# Patient Record
Sex: Female | Born: 1980 | Race: Black or African American | Hispanic: No | Marital: Single | Smoking: Never smoker
Health system: Southern US, Community
[De-identification: ages and names within clinical notes are randomized; demographics above are authoritative.]

## PROBLEM LIST (undated history)

## (undated) DIAGNOSIS — E669 Obesity, unspecified: Secondary | ICD-10-CM

## (undated) DIAGNOSIS — I639 Cerebral infarction, unspecified: Secondary | ICD-10-CM

## (undated) DIAGNOSIS — J302 Other seasonal allergic rhinitis: Secondary | ICD-10-CM

## (undated) HISTORY — PX: ABDOMINAL SURGERY: SHX537

---

## 2017-04-06 HISTORY — PX: ABDOMINAL SURGERY: SHX537

## 2021-06-24 ENCOUNTER — Encounter (HOSPITAL_COMMUNITY): Payer: Self-pay | Admitting: Emergency Medicine

## 2021-06-24 ENCOUNTER — Emergency Department (HOSPITAL_COMMUNITY): Payer: BC Managed Care – PPO

## 2021-06-24 ENCOUNTER — Other Ambulatory Visit: Payer: Self-pay

## 2021-06-24 ENCOUNTER — Observation Stay (HOSPITAL_COMMUNITY): Payer: BC Managed Care – PPO

## 2021-06-24 ENCOUNTER — Inpatient Hospital Stay (HOSPITAL_COMMUNITY)
Admission: EM | Admit: 2021-06-24 | Discharge: 2021-06-28 | DRG: 065 | Disposition: A | Discharge status: Rehabilitation Facility | Payer: BC Managed Care – PPO | Attending: Family Medicine | Admitting: Family Medicine

## 2021-06-24 DIAGNOSIS — R262 Difficulty in walking, not elsewhere classified: Secondary | ICD-10-CM | POA: Diagnosis present

## 2021-06-24 DIAGNOSIS — I69354 Hemiplegia and hemiparesis following cerebral infarction affecting left non-dominant side: Secondary | ICD-10-CM

## 2021-06-24 DIAGNOSIS — Z20822 Contact with and (suspected) exposure to covid-19: Secondary | ICD-10-CM | POA: Diagnosis present

## 2021-06-24 DIAGNOSIS — R29705 NIHSS score 5: Secondary | ICD-10-CM | POA: Diagnosis present

## 2021-06-24 DIAGNOSIS — I6389 Other cerebral infarction: Secondary | ICD-10-CM

## 2021-06-24 DIAGNOSIS — J302 Other seasonal allergic rhinitis: Secondary | ICD-10-CM

## 2021-06-24 DIAGNOSIS — D509 Iron deficiency anemia, unspecified: Secondary | ICD-10-CM

## 2021-06-24 DIAGNOSIS — E538 Deficiency of other specified B group vitamins: Secondary | ICD-10-CM | POA: Diagnosis present

## 2021-06-24 DIAGNOSIS — I639 Cerebral infarction, unspecified: Secondary | ICD-10-CM | POA: Diagnosis not present

## 2021-06-24 DIAGNOSIS — Z9884 Bariatric surgery status: Secondary | ICD-10-CM

## 2021-06-24 DIAGNOSIS — Z8249 Family history of ischemic heart disease and other diseases of the circulatory system: Secondary | ICD-10-CM

## 2021-06-24 DIAGNOSIS — R7303 Prediabetes: Secondary | ICD-10-CM | POA: Diagnosis present

## 2021-06-24 DIAGNOSIS — Z823 Family history of stroke: Secondary | ICD-10-CM

## 2021-06-24 DIAGNOSIS — K5901 Slow transit constipation: Secondary | ICD-10-CM | POA: Diagnosis present

## 2021-06-24 DIAGNOSIS — I6381 Other cerebral infarction due to occlusion or stenosis of small artery: Secondary | ICD-10-CM | POA: Diagnosis not present

## 2021-06-24 DIAGNOSIS — E785 Hyperlipidemia, unspecified: Secondary | ICD-10-CM | POA: Diagnosis present

## 2021-06-24 DIAGNOSIS — Z6841 Body Mass Index (BMI) 40.0 and over, adult: Secondary | ICD-10-CM

## 2021-06-24 DIAGNOSIS — I10 Essential (primary) hypertension: Secondary | ICD-10-CM | POA: Diagnosis present

## 2021-06-24 HISTORY — DX: Other seasonal allergic rhinitis: J30.2

## 2021-06-24 HISTORY — DX: Obesity, unspecified: E66.9

## 2021-06-24 LAB — CBC
HCT: 37.5 % (ref 36.0–46.0)
HCT: 37.9 % (ref 36.0–46.0)
Hemoglobin: 12 g/dL (ref 12.0–15.0)
Hemoglobin: 12.2 g/dL (ref 12.0–15.0)
MCH: 26.1 pg (ref 26.0–34.0)
MCH: 26.3 pg (ref 26.0–34.0)
MCHC: 32 g/dL (ref 30.0–36.0)
MCHC: 32.2 g/dL (ref 30.0–36.0)
MCV: 81.5 fL (ref 80.0–100.0)
MCV: 81.9 fL (ref 80.0–100.0)
Platelets: 456 10*3/uL — ABNORMAL HIGH (ref 150–400)
Platelets: 454 10*3/uL — ABNORMAL HIGH (ref 150–400)
RBC: 4.6 MIL/uL (ref 3.87–5.11)
RBC: 4.63 MIL/uL (ref 3.87–5.11)
RDW: 13.7 % (ref 11.5–15.5)
RDW: 13.7 % (ref 11.5–15.5)
WBC: 5.8 10*3/uL (ref 4.0–10.5)
WBC: 7.2 10*3/uL (ref 4.0–10.5)
nRBC: 0 % (ref 0.0–0.2)
nRBC: 0 % (ref 0.0–0.2)

## 2021-06-24 LAB — COMPREHENSIVE METABOLIC PANEL
ALT: 21 U/L (ref 0–44)
AST: 19 U/L (ref 15–41)
Albumin: 3.5 g/dL (ref 3.5–5.0)
Alkaline Phosphatase: 61 U/L (ref 38–126)
Anion gap: 7 (ref 5–15)
BUN: 6 mg/dL (ref 6–20)
CO2: 24 mmol/L (ref 22–32)
Calcium: 8.9 mg/dL (ref 8.9–10.3)
Chloride: 106 mmol/L (ref 98–111)
Creatinine, Ser: 0.76 mg/dL (ref 0.44–1.00)
GFR, Estimated: >60 mL/min (ref >60)
Glucose, Bld: 95 mg/dL (ref 70–99)
Potassium: 3.9 mmol/L (ref 3.5–5.1)
Sodium: 137 mmol/L (ref 135–145)
Total Bilirubin: 0.4 mg/dL (ref 0.3–1.2)
Total Protein: 7.3 g/dL (ref 6.5–8.1)

## 2021-06-24 LAB — ECHOCARDIOGRAM COMPLETE BUBBLE STUDY
AR max vel: 2.96 cm2
AV Area VTI: 3.12 cm2
AV Area mean vel: 2.73 cm2
AV Mean grad: 4 mmHg
AV Peak grad: 7.3 mmHg
Ao pk vel: 1.35 m/s
Area-P 1/2: 2.99 cm2
S' Lateral: 3.1 cm

## 2021-06-24 LAB — LIPID PANEL
Cholesterol: 196 mg/dL (ref 0–200)
HDL: 46 mg/dL (ref >40)
LDL Cholesterol: 120 mg/dL — ABNORMAL HIGH (ref 0–99)
Total CHOL/HDL Ratio: 4.3 RATIO
Triglycerides: 148 mg/dL (ref <150)
VLDL: 30 mg/dL (ref 0–40)

## 2021-06-24 LAB — HEMOGLOBIN A1C
Hgb A1c MFr Bld: 5.9 % — ABNORMAL HIGH (ref 4.8–5.6)
Mean Plasma Glucose: 122.63 mg/dL

## 2021-06-24 LAB — I-STAT BETA HCG BLOOD, ED (MC, WL, AP ONLY): I-stat hCG, quantitative: <5 m[IU]/mL (ref <5)

## 2021-06-24 LAB — DIFFERENTIAL
Abs Immature Granulocytes: 0.02 10*3/uL (ref 0.00–0.07)
Basophils Absolute: 0 10*3/uL (ref 0.0–0.1)
Basophils Relative: 0 %
Eosinophils Absolute: 0.1 10*3/uL (ref 0.0–0.5)
Eosinophils Relative: 1 %
Immature Granulocytes: 0 %
Lymphocytes Relative: 33 %
Lymphs Abs: 1.9 10*3/uL (ref 0.7–4.0)
Monocytes Absolute: 0.3 10*3/uL (ref 0.1–1.0)
Monocytes Relative: 6 %
Neutro Abs: 3.4 10*3/uL (ref 1.7–7.7)
Neutrophils Relative %: 60 %

## 2021-06-24 LAB — TSH: TSH: 1.656 u[IU]/mL (ref 0.350–4.500)

## 2021-06-24 LAB — PROTIME-INR
INR: 0.9 (ref 0.8–1.2)
Prothrombin Time: 12.6 seconds (ref 11.4–15.2)

## 2021-06-24 LAB — SARS CORONAVIRUS 2 (TAT 6-24 HRS): SARS Coronavirus 2: NEGATIVE

## 2021-06-24 LAB — APTT: aPTT: 26 seconds (ref 24–36)

## 2021-06-24 MED ORDER — LABETALOL HCL 5 MG/ML IV SOLN: 5.0000 mg | INTRAVENOUS | Status: DC | PRN

## 2021-06-24 MED ORDER — ATORVASTATIN CALCIUM 40 MG PO TABS
40.0000 mg | ORAL_TABLET | Freq: Every day | ORAL | Status: DC
  Administered 2021-06-24 – 2021-06-28 (×5): 40 mg via ORAL
  Filled 2021-06-24 (×5): qty 1

## 2021-06-24 MED ORDER — SENNOSIDES-DOCUSATE SODIUM 8.6-50 MG PO TABS
1.0000 | ORAL_TABLET | Freq: Every evening | ORAL | Status: DC | PRN
Start: 2021-06-24 — End: 2021-06-27

## 2021-06-24 MED ORDER — ACETAMINOPHEN 160 MG/5ML PO SOLN: 650.0000 mg | ORAL | Status: DC | PRN

## 2021-06-24 MED ORDER — ACETAMINOPHEN 650 MG RE SUPP
650.0000 mg | RECTAL | Status: DC | PRN
Start: 2021-06-24 — End: 2021-06-27

## 2021-06-24 MED ORDER — ASPIRIN 81 MG PO CHEW
81.0000 mg | CHEWABLE_TABLET | Freq: Every day | ORAL | Status: DC
  Administered 2021-06-24 – 2021-06-28 (×5): 81 mg via ORAL
  Filled 2021-06-24 (×5): qty 1

## 2021-06-24 MED ORDER — ENOXAPARIN SODIUM 80 MG/0.8ML IJ SOSY
70.0000 mg | PREFILLED_SYRINGE | INTRAMUSCULAR | Status: DC
  Administered 2021-06-24 – 2021-06-27 (×4): 70 mg via SUBCUTANEOUS
  Filled 2021-06-24 (×3): qty 0.8
  Filled 2021-06-24: qty 0.7
  Filled 2021-06-24: qty 0.8

## 2021-06-24 MED ORDER — IOHEXOL 350 MG/ML SOLN
75.0000 mL | Freq: Once | INTRAVENOUS | Status: AC | PRN
  Administered 2021-06-24: 75 mL via INTRAVENOUS

## 2021-06-24 MED ORDER — STROKE: EARLY STAGES OF RECOVERY BOOK
Freq: Once | Status: AC
  Administered 2021-06-24: 1
  Filled 2021-06-24: qty 1

## 2021-06-24 MED ORDER — ACETAMINOPHEN 325 MG PO TABS
650.0000 mg | ORAL_TABLET | ORAL | Status: DC | PRN
  Administered 2021-06-25 – 2021-06-27 (×3): 650 mg via ORAL
  Filled 2021-06-24 (×3): qty 2

## 2021-06-24 MED ORDER — LORATADINE 10 MG PO TABS
10.0000 mg | ORAL_TABLET | Freq: Every day | ORAL | Status: DC
  Administered 2021-06-25 – 2021-06-28 (×4): 10 mg via ORAL
  Filled 2021-06-24 (×5): qty 1

## 2021-06-24 NOTE — H&P (Addendum)
Family Medicine Teaching [facility] Admission History and Physical Service Pager: [phone]  Patient name: Danielle Kelley Medical record number: [id number] Date of birth: [birth year] Age: 40 y.o. Gender: female  Primary Care Provider: Chyrel Masson Consultants: Neurology  Code Status: Full code  Preferred Emergency Contact: Franne Forts, Clearence Cheek, [phone]  Chief Complaint: left sided weakness and paraesthesias   Assessment and Plan: Danielle Kelley is a 40 y.o. female presenting with left sided weakness and paraesthesias. PMH is significant for obesity s/p gastric bypass.   Acute CVA  Patient presented to ED with left upper and lower extremity weakness.  Patient noticed weakness on the left side yesterday afternoon which progressed to having difficulty walking and heaviness on the left extremities.  She denies any other symptomatology.  Patient received aspirin 81 on presentation to the ED. CBC and CMP unremarkable.  Patient states she does not have any medical problems and that at her last 2 doctors appointments there were no concerns mentioned for her blood work.  Differential limited as stroke seen on imaging.  tPA was not administered as patient was outside therapeutic window.  Unsure at this time if patient meets stroke risk factors with hyperlipidemia and elevated A1c although patient does struggle with obesity. - admit to FPTS, med-tele, attending Dr. Deirdre Priest  - vital signs q2h x 12 hours, then q4h - fall precautions - Pulse ox check q2h x 12 hrs, then q4h - PT/OT/SLP eval and treat - Neurology following, appreciate recommendations -mNIHSS q2h x 12 hours then q4h - Cardiac monitoring continuous - f/u echocardiogram - f/u lipid panel - ASA 81mg  daily - start atorvastatin 40mg  daily - CMP, CBC daily  Allergies Patient reports that she is at home well controlled with generic brand Claritin. - Claritin 10 mg daily  FEN/GI: N.p.o. until able to pass swallow  study  Prophylaxis: Lovenox  Disposition: med-tele  History of Present Illness:  Danielle Kelley is a 40 y.o. female presenting with left upper and LE weakness   She reports that she presented to the ED after noticing weakness on her left side in her arm, leg, hand and foot. She reports that yesterday, she spent the day cleaning her home from 1030 until 1300. Her arm and leg on the left felt heavy and so she thought that she had applied to much pressure to that side of her body while cleaning 8/21 at 1330. This heaviness continued throughout the day. She denies any pain or headache yesterday. She reports that she was still able to cook and clean despite this heaviness that worsened today.She did not notice any asymmetry in her face. Patient reports doing a "smile test" and did not see any abnormalities. She then had trouble walking when she tried to get up and go to the restroom.  She reports normal grip ability on the left but states that her reaction time is slower than normal. She states that when she tries to ambulate, she falls toward the left. She denies any prior issues in the past.   ED course: MRI brain showed acute CVA. Neurology evaluated the patient and recommended ASA 81 and echo as well as lipid panel and hgb A1c.    Review Of Systems: Per HPI with the following additions:   Review of Systems  Constitutional:  Negative for chills, fatigue and fever.  HENT:  Negative for congestion, hearing loss and trouble swallowing.   Eyes:  Negative for visual disturbance.       Sees floaters (06/05/21)  Respiratory:  Negative for cough and shortness of breath.   Cardiovascular:  Negative for chest pain and palpitations.  Gastrointestinal:  Negative for abdominal pain, blood in stool, constipation, diarrhea, nausea and vomiting.  Genitourinary:  Negative for dysuria and hematuria.  Musculoskeletal:  Positive for gait problem.  Skin:  Negative for rash.  Neurological:  Positive for weakness.  Negative for tremors, seizures, syncope, facial asymmetry, speech difficulty and headaches.       Changes with word finding   Psychiatric/Behavioral:  Negative for hallucinations.     Patient Active Problem List   Diagnosis Date Noted   Acute CVA (cerebrovascular accident) (HCC) 06/24/2021     Past Medical History: -obesity  -allergy pill from [employer] (allergic rhinitis/conjunctivitis)   Past Surgical History: -2018 June 4, had gastric sleeve, previously weighed 355lbs  -C-sectionx3, Nov 07 2002, Nov 22 2004, February 16 2006  Social History: Additional social history: denies recreational drugs, never smoked, drinks wine socially/rare Moved to [place] in June of 2021 for work   Family History: Mother CVA 2018 (mother lives with the patient)  HTN- mother, maternal gma, maternal gf  Allergies and Medications: No Known Allergies No current facility-administered medications on file prior to encounter.   Current Outpatient Medications on File Prior to Encounter  Medication Sig Dispense Refill   cetirizine (ZYRTEC) 10 MG tablet Take 10 mg by mouth daily as needed for allergies.      Objective: BP (!) 138/103   Pulse 82   Temp 98.2 F (36.8 C) (Oral)   Resp (!) 23   Ht 5\' 10"  (1.778 m)   Wt (!) 142.9 kg   LMP 06/11/2021 (Exact Date)   SpO2 99%   BMI 45.20 kg/m   Exam: General: Awake, alert, comfortable appearing, no acute distress Eyes: EOMI, PERRLA ENTM: Moist mucous membranes Neck: Normal range of motion Cardiovascular: RRR, no murmurs auscultated Respiratory: CTA B, no increased work of breathing Gastrointestinal: Soft, obese abdomen, normoactive bowel sounds MSK: Normal tone Derm: No rashes or lesions noted Neuro: Cranial nerves grossly intact with the exception of cranial nerve XI presenting as weakened left shoulder shrug. 3/5 strength left upper and lower extremity, 5/5 strength right upper and lower extremity, decreased sensation to touch in left lower extremity  in the lateral portion.  No evidence of aphasia Psych: Normal mood and affect  Labs and Imaging: CBC BMET  Recent Labs  Lab 06/24/21 1555  WBC 7.2  HGB 12.2  HCT 37.9  PLT 454*   Recent Labs  Lab 06/24/21 0942  NA 137  K 3.9  CL 106  CO2 24  BUN 6  CREATININE 0.76  GLUCOSE 95  CALCIUM 8.9     EKG: NSR, normal PR and QRS interval, normal T wave, no QT prolongation  Imaging Head CT: Normal  Brain MRA/MRI: Small acute infarct in the periventricular right posterior frontal corona radiata, faint edema, no mass-effect. No large vessel occlusion or proximal hemodynamiccaly significant stenosis  06/26/21 DO PGY-1,  Family Medicine FPTS Intern pager: [phone], text pages welcome

## 2021-06-24 NOTE — Progress Notes (Addendum)
[name]  S Saw patient at bedside this evening. Patient was sitting up in bed comfortably. She reports she is able to move her left LUE and LLE a little bit. She is disappointed that she will not be able to fly to [place] to help her daughter move into university. She is hungry and would like a fork to eat her dinner. She also asked when she would be discharged and whether she would be able to fly anytime soon. I explained she would need to be in hospital for further monitoring post stroke and PT/OT. It would be unsafe for her to travel by flight this week. She understood and was grateful for the care she has received at [facility].   O: BP (!) 159/103   Pulse 71   Temp 98.2 F (36.8 C) (Oral)   Resp 20   Ht 5\' 10"  (1.778 m)   Wt (!) 142.9 kg   LMP 06/11/2021 (Exact Date)   SpO2 99%   BMI 45.20 kg/m    General: Alert, no acute distress, pleasant  Cardio: well perfused  Pulm: normal work of breathing Neuro: Cranial nerves grossly intact, 3/5 strength UE and LE, normal sensation throughout  A/P: Plan H&P -Neuro following -Continue stroke management  -Orders reviewed. Labs for AM ordered, which was adjusted as needed.   08/11/2021, MD 06/24/2021, 7:51 PM PGY-3, [place] [facility] Night Resident  Please page [phone] with questions.

## 2021-06-24 NOTE — ED Notes (Signed)
Patient transported to CT 

## 2021-06-24 NOTE — ED Notes (Signed)
In MRI

## 2021-06-24 NOTE — ED Notes (Signed)
IV team at bedside 

## 2021-06-24 NOTE — ED Notes (Signed)
Patient is getting echo

## 2021-06-24 NOTE — ED Triage Notes (Signed)
Pt reports LUE and LLE weakness onset yesterday. LSN 1030 yesterday morning. Did vigorous cleaning yesterday and yesterday evening noticed L arm and leg feeling a little off. This morning stumbled getting out of bed d/t weakness. Is able to stand with a good amount of assistance from staff, feels as if weakness is continuing to progress. Denies headache, vision changes, or dizziness.

## 2021-06-24 NOTE — Consult Note (Addendum)
Neurology Consultation Reason for Consult: Left-sided weakness Referring Physician: Dr Rolan Bucco  CC: Left-sided weakness  History is obtained from: Patient  HPI: Danielle Kelley is a 40 y.o. right-handed female with no significant past medical history who presented with left upper and lower extremity weakness and paresthesias.  Patient states she was cleaning her tub yesterday afternoon around 1:30 PM when she noticed that her left arm and leg felt "heavy".  She initially attributed this to putting pressure on her left side while cleaning.  Throughout the rest of the day she continued to notice that her left arm and leg were "heavy".  This morning when she woke up and tried to get out of bed she "stumbled" and then had trouble climbing up the stairs.  Therefore, she eventually decided to come to the emergency room for further evaluation. Denies any paresthesias or weakness of face, facial droop, trouble speaking, vision changes, bowel or bladder incontinence. Denies similar episodes in the past.  Denies any recent medications, travel, fever, headache.  Last known normal 06/23/2021 around 1330 NIH stroke scale 5 ( 2 for LUE, 2 for LLE, 2 for sensory)  ROS: All other systems reviewed and negative except as noted in the HPI.    Past medical history, past surgical history: Denies any significant past medical or surgical history  Social history: Denies history of nicotine use, alcohol use.  Works in [employer] at [employer].  Family history: Patient's mother had stroke.  Patient maternal aunt has multiple sclerosis.   Exam: Current vital signs: BP 134/79   Pulse 65   Temp 98.2 F (36.8 C)   Resp 18   Ht 5\' 10"  (1.778 m)   Wt (!) 142.9 kg   LMP 06/11/2021 (Exact Date)   SpO2 100%   BMI 45.20 kg/m  Vital signs in last 24 hours: Temp:  [98.2 F (36.8 C)] 98.2 F (36.8 C) (08/22 0923) Pulse Rate:  [65-74] 65 (08/22 1145) Resp:  [14-21] 18 (08/22 1145) BP: (129-147)/(77-96) 134/79 (08/22  1145) SpO2:  [98 %-100 %] 100 % (08/22 1145) Weight:  [142.9 kg] 142.9 kg (08/22 0918)   Physical Exam  Constitutional: Appears well-developed and well-nourished.  Psych: Affect appropriate to situation Eyes: No scleral injection HENT: No OP obstrucion Head: Normocephalic.  Cardiovascular: Normal rate and regular rhythm.  Respiratory: Effort normal, non-labored breathing GI: Soft.  No distension. There is no tenderness.  Skin: Warm Neuro: AOx3, follows commands, no evidence of aphasia, cranial nerves II to XII grossly intact, 3/5 strength in left upper and left lower extremity, 5/5 in right upper and lower extremity, decreased sensation to touch in left upper and lower extremity, FTN intact bilaterally, 2+ reflexes in bilateral biceps, knee jerk.  Plantars mute bilaterally, no ankle clonus.  I have reviewed labs in epic and the results pertinent to this consultation are: CBC:  Recent Labs  Lab 06/24/21 0942  WBC 5.8  NEUTROABS 3.4  HGB 12.0  HCT 37.5  MCV 81.5  PLT 456*    Basic Metabolic Panel:  Lab Results  Component Value Date   NA 137 06/24/2021   K 3.9 06/24/2021   CO2 24 06/24/2021   GLUCOSE 95 06/24/2021   BUN 6 06/24/2021   CREATININE 0.76 06/24/2021   CALCIUM 8.9 06/24/2021   GFRNONAA >60 06/24/2021   Lipid Panel: No results found for: LDLCALC HgbA1c: No results found for: HGBA1C Urine Drug Screen: No results found for: LABOPIA, COCAINSCRNUR, LABBENZ, AMPHETMU, THCU, LABBARB  Alcohol Level No results found  for: [facility]   I have reviewed the images obtained:   CT head without contrast 06/24/2021: No acute abnormality.  ASSESSMENT/PLAN: 40 year old female with no prior medical history who presented with left upper and lower extremity weakness and numbness since yesterday afternoon.  Left hemiparesis -Differentials include acute stroke versus demyelinating disease  Recommendations: -We will obtain MRI brain with and without contrast, MRI angio head without  contrast to look for acute stroke -Will also obtain MRI C-spine and T-spine with and without contrast potential demyelinating etiology -We will reevaluate patient after MRI for further management  ADDENDUM  Acute ischemic stroke - tPA not administered as patient was outside window. No thrombectomy as patient outside window - MRI Brain showed acute stroke which explains patient's presentation so we will discontinue spine imaging -Recommend admission under medicine service. - Ordered TTE as part of stroke work-up - Will check LDL, A1c to assess for stroke risk factors - Start patient on aspirin 81 mg as well as atorvastatin for secondary stroke prevention - permissive HTN with prn labetelol for SBP>180 -Updated ED provider regarding plan -Discussed MRI findings and further work-up with patient in detail    I have spent a total of  125 minutes with the patient reviewing hospital notes,  test results, labs and examining the patient as well as establishing an assessment and plan that was discussed personally with the patient.  > 50% of time was spent in direct patient care.   Lindie Spruce Epilepsy [facility] neurohospitalist

## 2021-06-24 NOTE — ED Provider Notes (Signed)
[facility] EMERGENCY DEPARTMENT Provider Note   CSN: [id number] Arrival date & time: 06/24/21  1540     History Chief Complaint  Patient presents with   Weakness    Danielle Kelley is a 40 y.o. female with no reported medical history, reports taking daily allergy pill.  Presents to the emergency department with a chief plaint of left upper extremity and left lower extremity weakness.  Patient reports that symptoms began yesterday afternoon.  Patient reports that symptoms have been constant since then.  Patient ports extremities felt "heavy yesterday."  Patient states that after waking this morning she had difficulty ambulating and difficulty climbing stairs due to her weakness.  Patient reports decree sensation to left upper and lower extremity as well as tingling sensation to left toes.  Last seen normal was 1030 yesterday morning.  Patient denies any recent falls or traumatic injuries.  No recent illness.  Denies any previous episodes of similar symptoms.  Denies any facial asymmetry, aphasia, dysphagia, visual disturbance, headache, neck pain, back pain, chest pain, shortness of breath.   Weakness Associated symptoms: no abdominal pain, no chest pain, no dizziness, no fever, no headaches, no nausea, no seizures, no shortness of breath and no vomiting       No past medical history on file.  There are no problems to display for this patient.      OB History   No obstetric history on file.     No family history on file.     Home Medications Prior to Admission medications   Not on File    Allergies    Patient has no known allergies.  Review of Systems   Review of Systems  Constitutional:  Negative for chills and fever.  Eyes:  Negative for visual disturbance.  Respiratory:  Negative for shortness of breath.   Cardiovascular:  Negative for chest pain.  Gastrointestinal:  Negative for abdominal pain, nausea and vomiting.  Genitourinary:  Negative  for difficulty urinating.  Musculoskeletal:  Negative for back pain, neck pain and neck stiffness.  Skin:  Negative for color change and rash.  Neurological:  Positive for weakness and numbness. Negative for dizziness, tremors, seizures, syncope, facial asymmetry, speech difficulty, light-headedness and headaches.  Psychiatric/Behavioral:  Negative for confusion.    Physical Exam Updated Vital Signs Pulse 74   Temp 98.2 F (36.8 C)   Resp 16   Ht 5\' 10"  (1.778 m)   Wt (!) 142.9 kg   LMP 06/11/2021 (Exact Date)   SpO2 99%   BMI 45.20 kg/m   Physical Exam Vitals and nursing note reviewed.  Constitutional:      General: She is not in acute distress.    Appearance: She is not ill-appearing, toxic-appearing or diaphoretic.  HENT:     Head: Normocephalic and atraumatic.     Jaw: No trismus.     Mouth/Throat:     Mouth: Mucous membranes are moist. No lacerations, oral lesions or angioedema.     Pharynx: Oropharynx is clear. Uvula midline. No pharyngeal swelling, oropharyngeal exudate, posterior oropharyngeal erythema or uvula swelling.     Tonsils: No tonsillar exudate or tonsillar abscesses. 1+ on the right. 1+ on the left.  Eyes:     General: No scleral icterus.       Right eye: No discharge.        Left eye: No discharge.     Extraocular Movements: Extraocular movements intact.     Conjunctiva/sclera: Conjunctivae normal.  Pupils: Pupils are equal, round, and reactive to light.  Cardiovascular:     Rate and Rhythm: Normal rate.  Pulmonary:     Effort: Pulmonary effort is normal. No tachypnea or bradypnea.     Breath sounds: Normal breath sounds. No stridor.  Abdominal:     General: Abdomen is protuberant. There is no distension. There are no signs of injury.     Palpations: Abdomen is soft. There is no mass or pulsatile mass.     Tenderness: There is no abdominal tenderness. There is no guarding or rebound.  Musculoskeletal:     Cervical back: Normal range of motion and  neck supple. No rigidity.  Skin:    General: Skin is warm and dry.  Neurological:     General: No focal deficit present.     Mental Status: She is alert and oriented to person, place, and time.     GCS: GCS eye subscore is 4. GCS verbal subscore is 5. GCS motor subscore is 6.     Cranial Nerves: Cranial nerve deficit present. No facial asymmetry.     Motor: No weakness, tremor, seizure activity or pronator drift.     Coordination: Finger-Nose-Finger Test normal.     Gait: Gait abnormal.     Comments: +5 strength to upper and lower right extremities, decreased grip strength to left hand, 4/5 strength to upper and lower left extremities, reports of decreased sensation to left face and left extremities.  Patient has difficulty ambulating and noted to shuffle her left leg.  Psychiatric:        Behavior: Behavior is cooperative.    ED Results / Procedures / Treatments   Labs (all labs ordered are listed, but only abnormal results are displayed) Labs Reviewed  CBC - Abnormal; Notable for the following components:      Result Value   Platelets 456 (*)    All other components within normal limits  SARS CORONAVIRUS 2 (TAT 6-24 HRS)  PROTIME-INR  APTT  DIFFERENTIAL  COMPREHENSIVE METABOLIC PANEL  I-STAT BETA HCG BLOOD, ED ([facility], [facility], [facility] ONLY)    EKG EKG Interpretation  Date/Time:  Monday June 24 2021 09:26:09 EDT Ventricular Rate:  71 PR Interval:  160 QRS Duration: 85 QT Interval:  421 QTC Calculation: 458 R Axis:   66 Text Interpretation: Sinus rhythm No old tracing to compare Confirmed by Rolan Bucco [phone]) on 06/24/2021 9:32:28 AM  Radiology CT HEAD WO CONTRAST ( )  Result Date: 06/24/2021 CLINICAL DATA:  Neuro deficit, acute, stroke suspected. Left-sided weakness beginning yesterday. EXAM: CT HEAD WITHOUT CONTRAST TECHNIQUE: Contiguous axial images were obtained from the base of the skull through the vertex without intravenous contrast. COMPARISON:  None. FINDINGS:  Brain: The brain shows a normal appearance without evidence of malformation, atrophy, old or acute small or large vessel infarction, mass lesion, hemorrhage, hydrocephalus or extra-axial collection. Vascular: No hyperdense vessel. No evidence of atherosclerotic calcification. Skull: Normal.  No traumatic finding.  No focal bone lesion. Sinuses/Orbits: Sinuses are clear. Orbits appear normal. Mastoids are clear. Other: None significant IMPRESSION: Normal head CT. Electronically Signed   By: Paulina Fusi M.D.   On: 06/24/2021 10:34    Procedures .Critical Care  Date/Time: 06/24/2021 5:37 PM Performed by: Haskel Schroeder, PA-C Authorized by: Haskel Schroeder, PA-C   Critical care provider statement:    Critical care time (minutes):  30   Critical care was necessary to treat or prevent imminent or life-threatening deterioration of the following conditions:  CNS failure or compromise   Critical care was time spent personally by me on the following activities:  Discussions with consultants, examination of patient, ordering and review of laboratory studies, ordering and review of radiographic studies, pulse oximetry, re-evaluation of patient's condition, obtaining history from patient or surrogate and review of old charts   Care discussed with: admitting provider     Medications Ordered in ED Medications - No data to display  ED Course  I have reviewed the triage vital signs and the nursing notes.  Pertinent labs & imaging results that were available during my care of the patient were reviewed by me and considered in my medical decision making (see chart for details).    MDM Rules/Calculators/A&P                           Alert 40 year old female in no acute distress, nontoxic appearing.  Presents to the emergency department with chief complaint of weakness and decreased sensation to left upper and lower extremities.  Symptoms started yesterday afternoon.  Last seen normal 1030 yesterday  morning.  Patient has reports of decree sensation to left side of her face, as left upper and lower extremity.  4/5 strength to left upper and lower extremity.  Patient has difficulty ambulating due to shuffling left lower extremity.  No facial asymmetry noted, EOM intact bilaterally, pupils PERRL.  Will obtain stroke panel and noncontrast head CT.  CBC, CMP, INR, APTT are unremarkable Noncontrast head CT shows no acute intracranial abnormality.  Will place MRI and MRA of brain for further evaluation.  We will contact on-call neurologist for consult.  1050 spoke to neurology team who will see the patient.  Contacted by neurologist Dr. Melynda Ripple of reports that patient has evidence of CVA on her MRI.  Request admission to hospitalist team.  1352 talks to hospitalist Dr. Roxan Hockey who will see the patient for admission.  Patient care and treatment were discussed with attending physician Dr. Fredderick Phenix.  Final Clinical Impression(s) / ED Diagnoses Final diagnoses:  Cerebrovascular accident (CVA), unspecified mechanism [facility])    Rx / DC Orders ED Discharge Orders     None        Berneice Heinrich 06/24/21 1738    Rolan Bucco, MD 06/25/21 1056

## 2021-06-24 NOTE — ED Notes (Signed)
Attempt to give report x2. Left phone number with Diplomatic Services operational officer.

## 2021-06-24 NOTE — ED Notes (Signed)
Placed food tray order. It should arrive around 20:20 

## 2021-06-24 NOTE — ED Notes (Signed)
Provider approve pt diet.

## 2021-06-24 NOTE — ED Notes (Signed)
Patient transported to MRI 

## 2021-06-24 NOTE — ED Notes (Signed)
Meal at the bedside °

## 2021-06-24 NOTE — ED Notes (Signed)
Attempted report x1. 

## 2021-06-25 ENCOUNTER — Observation Stay (HOSPITAL_COMMUNITY): Payer: BC Managed Care – PPO

## 2021-06-25 ENCOUNTER — Encounter (HOSPITAL_COMMUNITY): Payer: Self-pay | Admitting: Family Medicine

## 2021-06-25 DIAGNOSIS — E7800 Pure hypercholesterolemia, unspecified: Secondary | ICD-10-CM | POA: Diagnosis not present

## 2021-06-25 DIAGNOSIS — K5901 Slow transit constipation: Secondary | ICD-10-CM | POA: Diagnosis present

## 2021-06-25 DIAGNOSIS — R7303 Prediabetes: Secondary | ICD-10-CM | POA: Diagnosis present

## 2021-06-25 DIAGNOSIS — I639 Cerebral infarction, unspecified: Secondary | ICD-10-CM | POA: Diagnosis present

## 2021-06-25 DIAGNOSIS — I10 Essential (primary) hypertension: Secondary | ICD-10-CM | POA: Diagnosis present

## 2021-06-25 DIAGNOSIS — R29705 NIHSS score 5: Secondary | ICD-10-CM | POA: Diagnosis present

## 2021-06-25 DIAGNOSIS — Z6841 Body Mass Index (BMI) 40.0 and over, adult: Secondary | ICD-10-CM | POA: Diagnosis not present

## 2021-06-25 DIAGNOSIS — Z9884 Bariatric surgery status: Secondary | ICD-10-CM | POA: Diagnosis not present

## 2021-06-25 DIAGNOSIS — G8194 Hemiplegia, unspecified affecting left nondominant side: Secondary | ICD-10-CM | POA: Diagnosis not present

## 2021-06-25 DIAGNOSIS — E538 Deficiency of other specified B group vitamins: Secondary | ICD-10-CM | POA: Diagnosis present

## 2021-06-25 DIAGNOSIS — Z20822 Contact with and (suspected) exposure to covid-19: Secondary | ICD-10-CM | POA: Diagnosis present

## 2021-06-25 DIAGNOSIS — Z823 Family history of stroke: Secondary | ICD-10-CM | POA: Diagnosis not present

## 2021-06-25 DIAGNOSIS — I6381 Other cerebral infarction due to occlusion or stenosis of small artery: Secondary | ICD-10-CM | POA: Diagnosis present

## 2021-06-25 DIAGNOSIS — Z8249 Family history of ischemic heart disease and other diseases of the circulatory system: Secondary | ICD-10-CM | POA: Diagnosis not present

## 2021-06-25 DIAGNOSIS — R262 Difficulty in walking, not elsewhere classified: Secondary | ICD-10-CM | POA: Diagnosis present

## 2021-06-25 DIAGNOSIS — E785 Hyperlipidemia, unspecified: Secondary | ICD-10-CM | POA: Diagnosis present

## 2021-06-25 DIAGNOSIS — I69354 Hemiplegia and hemiparesis following cerebral infarction affecting left non-dominant side: Secondary | ICD-10-CM | POA: Diagnosis not present

## 2021-06-25 DIAGNOSIS — J302 Other seasonal allergic rhinitis: Secondary | ICD-10-CM | POA: Diagnosis present

## 2021-06-25 DIAGNOSIS — D509 Iron deficiency anemia, unspecified: Secondary | ICD-10-CM | POA: Diagnosis present

## 2021-06-25 LAB — CBC
HCT: 33.9 % — ABNORMAL LOW (ref 36.0–46.0)
Hemoglobin: 10.9 g/dL — ABNORMAL LOW (ref 12.0–15.0)
MCH: 25.9 pg — ABNORMAL LOW (ref 26.0–34.0)
MCHC: 32.2 g/dL (ref 30.0–36.0)
MCV: 80.5 fL (ref 80.0–100.0)
Platelets: 421 10*3/uL — ABNORMAL HIGH (ref 150–400)
RBC: 4.21 MIL/uL (ref 3.87–5.11)
RDW: 13.7 % (ref 11.5–15.5)
WBC: 5.8 10*3/uL (ref 4.0–10.5)
nRBC: 0 % (ref 0.0–0.2)

## 2021-06-25 LAB — RAPID URINE DRUG SCREEN, HOSP PERFORMED
Amphetamines: NOT DETECTED
Barbiturates: NOT DETECTED
Benzodiazepines: NOT DETECTED
Cocaine: NOT DETECTED
Opiates: NOT DETECTED
Tetrahydrocannabinol: NOT DETECTED

## 2021-06-25 LAB — SEDIMENTATION RATE: Sed Rate: 20 mm/hr (ref 0–22)

## 2021-06-25 LAB — HIV ANTIBODY (ROUTINE TESTING W REFLEX): HIV Screen 4th Generation wRfx: NONREACTIVE

## 2021-06-25 LAB — COMPREHENSIVE METABOLIC PANEL
ALT: 21 U/L (ref 0–44)
AST: 18 U/L (ref 15–41)
Albumin: 3.2 g/dL — ABNORMAL LOW (ref 3.5–5.0)
Alkaline Phosphatase: 57 U/L (ref 38–126)
Anion gap: 7 (ref 5–15)
BUN: 6 mg/dL (ref 6–20)
CO2: 25 mmol/L (ref 22–32)
Calcium: 8.8 mg/dL — ABNORMAL LOW (ref 8.9–10.3)
Chloride: 105 mmol/L (ref 98–111)
Creatinine, Ser: 0.76 mg/dL (ref 0.44–1.00)
GFR, Estimated: >60 mL/min (ref >60)
Glucose, Bld: 100 mg/dL — ABNORMAL HIGH (ref 70–99)
Potassium: 3.4 mmol/L — ABNORMAL LOW (ref 3.5–5.1)
Sodium: 137 mmol/L (ref 135–145)
Total Bilirubin: 0.5 mg/dL (ref 0.3–1.2)
Total Protein: 6.5 g/dL (ref 6.5–8.1)

## 2021-06-25 LAB — RPR: RPR Ser Ql: NONREACTIVE

## 2021-06-25 LAB — C-REACTIVE PROTEIN: CRP: 0.6 mg/dL (ref <1.0)

## 2021-06-25 LAB — VITAMIN B12: Vitamin B-12: 141 pg/mL — ABNORMAL LOW (ref 180–914)

## 2021-06-25 MED ORDER — CYANOCOBALAMIN 1000 MCG/ML IJ SOLN
1000.0000 ug | Freq: Once | INTRAMUSCULAR | Status: AC
  Administered 2021-06-25: 1000 ug via INTRAMUSCULAR
  Filled 2021-06-25: qty 1

## 2021-06-25 MED ORDER — SODIUM CHLORIDE 0.9 % IV SOLN: INTRAVENOUS | Status: DC

## 2021-06-25 MED ORDER — VITAMIN B-12 1000 MCG PO TABS
1000.0000 ug | ORAL_TABLET | Freq: Every day | ORAL | Status: DC
  Administered 2021-06-26 – 2021-06-28 (×3): 1000 ug via ORAL
  Filled 2021-06-25 (×3): qty 1

## 2021-06-25 MED ORDER — CLOPIDOGREL BISULFATE 75 MG PO TABS
75.0000 mg | ORAL_TABLET | Freq: Every day | ORAL | Status: DC
  Administered 2021-06-25 – 2021-06-28 (×4): 75 mg via ORAL
  Filled 2021-06-25 (×4): qty 1

## 2021-06-25 NOTE — Progress Notes (Signed)
    [facility] has been requested to perform a transesophageal echocardiogram on Danielle Kelley for stroke.  After careful review of history and examination, the risks and benefits of transesophageal echocardiogram have been explained including risks of esophageal damage, perforation (1:10,000 risk), bleeding, pharyngeal hematoma as well as other potential complications associated with conscious sedation including aspiration, arrhythmia, respiratory failure and death. Alternatives to treatment were discussed, questions were answered. Patient is willing to proceed.   Pt scheduled for TEE tomorrow at 11:00 AM with Dr. Cristal Deer. NPO at MN please.  Roe Rutherford [place], [place]  06/25/2021 5:37 PM

## 2021-06-25 NOTE — Progress Notes (Signed)
Received pt from the ED, alert and oriented, MD orders implemented, personal items in reach, call light in reach, V/s within normal limits, oriented to the room, Placed on tele and vitrified.

## 2021-06-25 NOTE — Evaluation (Signed)
Speech Language Pathology Evaluation Patient Details Name: Danielle Kelley MRN: [id number] DOB: Dec 10, 1980 Today's Date: 06/25/2021 Time: 0910-0956 SLP Time Calculation (min) (ACUTE ONLY): 46 min  Problem List:  Patient Active Problem List   Diagnosis Date Noted   Cerebrovascular accident (CVA) (HCC) 06/24/2021   Past Medical History: History reviewed. No pertinent past medical history. Past Surgical History: *The histories are not reviewed yet. Please review them in the "History" navigator section and refresh this SmartLink. HPI:  Per MD notes, pt is a "40 y.o. right-handed female with no significant past medical history who presented with left upper and lower extremity weakness and paresthesias.  Patient states she was cleaning her tub yesterday afternoon around 1:30 PM when she noticed that her left arm and leg felt "heavy".  She initially attributed this to putting pressure on her left side while cleaning.  Throughout the rest of the day she continued to notice that her left arm and leg were "heavy".  This morning when she woke up and tried to get out of bed she "stumbled" and then had trouble climbing up the stairs.  H/O obesity s/p gastric bypass.   Mild headache this morning with photophobia, continued with left sided-weakness per MD note. Unsteady gait, takes a while to get out of the bed, needs handrail/assistance to walk."  Patient was concerned that this happened and does not understand why she does not have any significant risk factors other than obesity.  Patient is a non-smoker, does not have any other significant medical conditions.   CT head right posterior frontal corona radiata infarct per MRI 06/30/2021.   Assessment / Plan / Recommendation Clinical Impression  SLUMS administered with pt scoring 26/30- missing problem solving question due to decreased attention to detail and difficulty with recall of 1/5 words requiring multiple choice cue.  She had a headache during the session,  RN was present giving medications and pt's phone was ringing, thus significant distractions likely impacted her results.  Only CN deficits noted including trigeminal with slight lower impaired facial sensation and facial nerve with very minimal facial asymmetry - otherwise no other cranial nerve deficits apparent.  SLP delved further into pt's expressive language skills including and pt demonstrating minimal delayed responses however pt reports she always "thought before she spoke before" and pt and this SLP suspect this is compensatory.    She is fluent with SLP - provided pt with optional tasks to practice word finding skills if desired; word fluency tasks, etc.  No SLP follow up indicated as all education completed.    SLP Assessment  SLP Recommendation/Assessment: Patient does not need any further Speech Lanaguage Pathology Services SLP Visit Diagnosis: Cognitive communication deficit (R41.841)    Follow Up Recommendations  None    Frequency and Duration   N/a        SLP Evaluation Cognition  Overall Cognitive Status: Within Functional Limits for tasks assessed Arousal/Alertness: Awake/alert Orientation Level: Oriented X4 Year: 2022 Month: August Day of Week: Correct Attention: Sustained;Selective Sustained Attention: Appears intact Selective Attention: Appears intact Memory: Impaired Memory Impairment: Retrieval deficit (4/5 words recalled independently, 1/5 with category cue) Awareness: Appears intact Problem Solving: Appears intact Problem Solving Impairment: Verbal complex Safety/Judgment: Appears intact Comments: repeating number in backward order  - pt able to up to 4 digits, beyond this she missed one or two; [facility]       Comprehension  Auditory Comprehension Overall Auditory Comprehension: Appears within functional limits for tasks assessed Yes/No Questions: Not tested  Commands: Within Functional Limits Conversation: Complex Copy Discrimination: Within Function Limits Reading Comprehension Reading Status: Not tested    Expression Expression Primary Mode of Expression: Verbal Verbal Expression Initiation: No impairment Level of Generative/Spontaneous Verbalization: Conversation Repetition: No impairment Naming: Not tested Confrontation: Not tested Other Naming Comments: verbal fluency 12 words that begin with C in one minutes-  expected 15; 18 articles of clothing named in one minute; higher level task - naming city-fruit-city-fruit, etc - 5 named correctly Pragmatics: No impairment Written Expression Dominant Hand: Right Written Expression:  (able to draw clock)   Oral / Motor  Oral Motor/Sensory Function Overall Oral Motor/Sensory Function: Mild impairment Facial Symmetry: Abnormal symmetry left;Suspected CN VII (facial) dysfunction (very slight facial asymmetry on the left - subtle) Facial Sensation: 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;Reduced left (lower branch of trigimenial nerve impacted) Motor Speech Overall Motor Speech: Appears within functional limits for tasks assessed Respiration: Within functional limits Phonation: Normal Resonance: Within functional limits Articulation: Within functional limitis Intelligibility: Intelligible Motor Planning: Witnin functional limits Motor Speech Errors: Aware Interfering Components: Premorbid status Effective Techniques: Slow rate   GO                    Chales Abrahams 06/25/2021, 10:43 AM  [name], MS [facility] SLP Acute Rehab Services Office [phone] Pager [phone]

## 2021-06-25 NOTE — H&P (View-Only) (Signed)
STROKE TEAM PROGRESS NOTE   INTERVAL HISTORY No family is at the bedside.  Patient lying in bed, awake alert orientated.  Neuro intact except mild left upper extremity drift.  She denies any OCP use, smoking, alcohol or illicit drug use.  Examined any migraine headache, confusion or cognitive impairment.  She does admitted that she not eating healthy and did not control her diet.  Of note, BMI 45.2.  OBJECTIVE Vitals:   06/24/21 2032 06/24/21 2332 06/25/21 0407 06/25/21 0724  BP: (!) 152/86 (!) 146/79 (!) 152/101 128/82  Pulse: 64 71 62 66  Resp: 18 18 19 14  Temp: 98 F (36.7 C) 97.8 F (36.6 C) 98 F (36.7 C) 98.5 F (36.9 C)  TempSrc: Oral Oral Oral Oral  SpO2: 99% 99% 100% 100%  Weight:      Height:        CBC:  Recent Labs  Lab 06/24/21 0942 06/24/21 1555 06/25/21 0326  WBC 5.8 7.2 5.8  NEUTROABS 3.4  --   --   HGB 12.0 12.2 10.9*  HCT 37.5 37.9 33.9*  MCV 81.5 81.9 80.5  PLT 456* 454* 421*    Basic Metabolic Panel:  Recent Labs  Lab 06/24/21 0942 06/25/21 0326  NA 137 137  K 3.9 3.4*  CL 106 105  CO2 24 25  GLUCOSE 95 100*  BUN 6 6  CREATININE 0.76 0.76  CALCIUM 8.9 8.8*    Lipid Panel:     Component Value Date/Time   CHOL 196 06/24/2021 1606   TRIG 148 06/24/2021 1606   HDL 46 06/24/2021 1606   CHOLHDL 4.3 06/24/2021 1606   VLDL 30 06/24/2021 1606   LDLCALC 120 (H) 06/24/2021 1606   HgbA1c:  Lab Results  Component Value Date   HGBA1C 5.9 (H) 06/24/2021   Urine Drug Screen:     Component Value Date/Time   LABOPIA NONE DETECTED 06/25/2021 0539   COCAINSCRNUR NONE DETECTED 06/25/2021 0539   LABBENZ NONE DETECTED 06/25/2021 0539   AMPHETMU NONE DETECTED 06/25/2021 0539   THCU NONE DETECTED 06/25/2021 0539   LABBARB NONE DETECTED 06/25/2021 0539    Alcohol Level No results found for: ETH  IMAGING   CT HEAD WO CONTRAST (5MM)  Result Date: 06/24/2021 CLINICAL DATA:  Neuro deficit, acute, stroke suspected. Left-sided weakness beginning  yesterday. EXAM: CT HEAD WITHOUT CONTRAST TECHNIQUE: Contiguous axial images were obtained from the base of the skull through the vertex without intravenous contrast. COMPARISON:  None. FINDINGS: Brain: The brain shows a normal appearance without evidence of malformation, atrophy, old or acute small or large vessel infarction, mass lesion, hemorrhage, hydrocephalus or extra-axial collection. Vascular: No hyperdense vessel. No evidence of atherosclerotic calcification. Skull: Normal.  No traumatic finding.  No focal bone lesion. Sinuses/Orbits: Sinuses are clear. Orbits appear normal. Mastoids are clear. Other: None significant IMPRESSION: Normal head CT. Electronically Signed   By: Mark  Shogry M.D.   On: 06/24/2021 10:34   CT ANGIO NECK W OR WO CONTRAST  Result Date: 06/24/2021 CLINICAL DATA:  Stroke/TIA, determine embolic source EXAM: CT ANGIOGRAPHY NECK TECHNIQUE: Multidetector CT imaging of the neck was performed using the standard protocol during bolus administration of intravenous contrast. Multiplanar CT image reconstructions and MIPs were obtained to evaluate the vascular anatomy. Carotid stenosis measurements (when applicable) are obtained utilizing NASCET criteria, using the distal internal carotid diameter as the denominator. CONTRAST:  75mL OMNIPAQUE IOHEXOL 350 MG/ML SOLN COMPARISON:  Same day MR/MRA Head FINDINGS: Aortic arch: Standard branching. Imaged portion   shows no evidence of aneurysm or dissection. No significant stenosis of the major arch vessel origins. Right carotid system: No evidence of dissection, stenosis (50% or greater) or occlusion. Left carotid system: No evidence of dissection, stenosis (50% or greater) or occlusion. The left common carotid artery takes a medialized/retropharyngeal course. Vertebral arteries: Codominant. No evidence of dissection, stenosis (50% or greater) or occlusion. Skeleton: There is no acute osseous abnormality. Other neck: The soft tissues are  unremarkable. Upper chest: The lung apices are clear. IMPRESSION: Normal CTA neck. Electronically Signed   By: Lesia Hausen M.D.   On: 06/24/2021 19:46   MR ANGIO HEAD WO CONTRAST  Result Date: 06/24/2021 CLINICAL DATA:  Demyelinating disease; Neuro deficit, acute, stroke suspected EXAM: MRI HEAD WITHOUT CONTRAST MRA HEAD WITHOUT CONTRAST TECHNIQUE: Multiplanar, multi-echo pulse sequences of the brain and surrounding structures were acquired without intravenous contrast. Angiographic images of the Circle of Willis were acquired using MRA technique without intravenous contrast. COMPARISON:  No pertinent prior exam. FINDINGS: MRI HEAD FINDINGS Brain: Focus of restricted diffusion in the periventricular right posterior frontal corona radiata, immediately superior to and potentially involving the right thalamus. Faint edema without mass effect. No other areas of T2 hyperintensity within the white matter. No hydrocephalus, mass lesion, abnormal mass effect, acute hemorrhage, extra-axial fluid collection. Small dilated perivascular spaces in the inferior basal ganglia bilaterally. Vascular: See below. Skull and upper cervical spine: Normal marrow signal. Sinuses/Orbits: Clear sinuses.  Unremarkable orbits. Other: No mastoid effusions. MRA HEAD FINDINGS Anterior circulation: Bilateral intracranial ICAs, MCAs, and ACAs are patent without proximal hemodynamically significant stenosis. No aneurysm identified. Posterior circulation: The visualized intradural vertebral arteries are patent without significant stenosis. Bilateral PICAs are patent. The basilar artery and bilateral posterior cerebral arteries are patent without proximal hemodynamically significant stenosis. There is a prominent right-sided posterior communicating artery with a small and tortuous right P1 PCA coursing anteriorly and joining the posterior communicating artery, anatomic variant. No aneurysm identified. Anatomic variants: Detailed above.  IMPRESSION: MRI: Findings compatible with a small acute infarct in the periventricular right posterior frontal corona radiata, immediately superior to and potentially involving the right thalamus. Faint edema without mass effect. MRA: No large vessel occlusion or proximal hemodynamically significant stenosis. Electronically Signed   By: Feliberto Harts M.D.   On: 06/24/2021 13:56   MR BRAIN WO CONTRAST  Result Date: 06/24/2021 CLINICAL DATA:  Demyelinating disease; Neuro deficit, acute, stroke suspected EXAM: MRI HEAD WITHOUT CONTRAST MRA HEAD WITHOUT CONTRAST TECHNIQUE: Multiplanar, multi-echo pulse sequences of the brain and surrounding structures were acquired without intravenous contrast. Angiographic images of the Circle of Willis were acquired using MRA technique without intravenous contrast. COMPARISON:  No pertinent prior exam. FINDINGS: MRI HEAD FINDINGS Brain: Focus of restricted diffusion in the periventricular right posterior frontal corona radiata, immediately superior to and potentially involving the right thalamus. Faint edema without mass effect. No other areas of T2 hyperintensity within the white matter. No hydrocephalus, mass lesion, abnormal mass effect, acute hemorrhage, extra-axial fluid collection. Small dilated perivascular spaces in the inferior basal ganglia bilaterally. Vascular: See below. Skull and upper cervical spine: Normal marrow signal. Sinuses/Orbits: Clear sinuses.  Unremarkable orbits. Other: No mastoid effusions. MRA HEAD FINDINGS Anterior circulation: Bilateral intracranial ICAs, MCAs, and ACAs are patent without proximal hemodynamically significant stenosis. No aneurysm identified. Posterior circulation: The visualized intradural vertebral arteries are patent without significant stenosis. Bilateral PICAs are patent. The basilar artery and bilateral posterior cerebral arteries are patent without proximal hemodynamically significant stenosis. There is  a prominent  right-sided posterior communicating artery with a small and tortuous right P1 PCA coursing anteriorly and joining the posterior communicating artery, anatomic variant. No aneurysm identified. Anatomic variants: Detailed above. IMPRESSION: MRI: Findings compatible with a small acute infarct in the periventricular right posterior frontal corona radiata, immediately superior to and potentially involving the right thalamus. Faint edema without mass effect. MRA: No large vessel occlusion or proximal hemodynamically significant stenosis. Electronically Signed   By: Feliberto Harts M.D.   On: 06/24/2021 13:56   ECHOCARDIOGRAM COMPLETE BUBBLE STUDY  Result Date: 06/24/2021    ECHOCARDIOGRAM REPORT   Patient Name:   Danielle Kelley Date of Exam: 06/24/2021 Medical Rec #:  [id number]      Height:       70.0 in Accession #:    [id number]     Weight:       315.0 lb Date of Birth:  [birth year]      BSA:          2.532 m Patient Age:    39 years       BP:           110/95 mmHg Patient Gender: F              HR:           72 bpm. Exam Location:  Inpatient Procedure: 2D Echo, Cardiac Doppler, Color Doppler and Saline Contrast Bubble            Study Indications:    Stroke  History:        Patient has prior history of Echocardiogram examinations. Risk                 Factors:Morbid Obesity.  Sonographer:    Roosvelt Maser RDCS Referring Phys: [id number] [facility] O YADAV IMPRESSIONS  1. Left ventricular ejection fraction, by estimation, is 60 to 65%. The left ventricle has normal function. The left ventricle has no regional wall motion abnormalities. There is mild left ventricular hypertrophy. Left ventricular diastolic parameters were normal.  2. Right ventricular systolic function is normal. The right ventricular size is normal. Tricuspid regurgitation signal is inadequate for assessing PA pressure.  3. The mitral valve is normal in structure. Trivial mitral valve regurgitation. No evidence of mitral stenosis.  4. The aortic valve is  tricuspid. Aortic valve regurgitation is not visualized. No aortic stenosis is present.  5. The inferior vena cava is normal in size with greater than 50% respiratory variability, suggesting right atrial pressure of 3 mmHg.  6. Agitated saline contrast bubble study was done. While no clear shunt was seen, bubble study was technically difficult with poor visualization of cardiac chambers. Consider TEE for further evaluation Conclusion(s)/Recommendation(s): No intracardiac source of embolism detected on this transthoracic study. A transesophageal echocardiogram is recommended to exclude cardiac source of embolism if clinically indicated. FINDINGS  Left Ventricle: Left ventricular ejection fraction, by estimation, is 60 to 65%. The left ventricle has normal function. The left ventricle has no regional wall motion abnormalities. The left ventricular internal cavity size was normal in size. There is  mild left ventricular hypertrophy. Left ventricular diastolic parameters were normal. Right Ventricle: The right ventricular size is normal. No increase in right ventricular wall thickness. Right ventricular systolic function is normal. Tricuspid regurgitation signal is inadequate for assessing PA pressure. Left Atrium: Left atrial size was normal in size. Right Atrium: Right atrial size was normal in size. Pericardium: There is no evidence of pericardial effusion. Mitral Valve: The  mitral valve is normal in structure. Trivial mitral valve regurgitation. No evidence of mitral valve stenosis. Tricuspid Valve: The tricuspid valve is normal in structure. Tricuspid valve regurgitation is trivial. Aortic Valve: The aortic valve is tricuspid. Aortic valve regurgitation is not visualized. No aortic stenosis is present. Aortic valve mean gradient measures 4.0 mmHg. Aortic valve peak gradient measures 7.3 mmHg. Aortic valve area, by VTI measures 3.12 cm. Pulmonic Valve: The pulmonic valve was not well visualized. Pulmonic valve  regurgitation is not visualized. Aorta: The aortic root and ascending aorta are structurally normal, with no evidence of dilitation. Venous: The inferior vena cava is normal in size with greater than 50% respiratory variability, suggesting right atrial pressure of 3 mmHg. IAS/Shunts: The interatrial septum was not well visualized. Agitated saline contrast was given intravenously to evaluate for intracardiac shunting. Agitated saline contrast bubble study was negative, with no evidence of any interatrial shunt.  LEFT VENTRICLE PLAX 2D LVIDd:         5.10 cm  Diastology LVIDs:         3.10 cm  LV e' medial:    11.00 cm/s LV PW:         1.00 cm  LV E/e' medial:  7.7 LV IVS:        0.80 cm  LV e' lateral:   10.70 cm/s LVOT diam:     2.20 cm  LV E/e' lateral: 7.9 LV SV:         79 LV SV Index:   31 LVOT Area:     3.80 cm  RIGHT VENTRICLE RV Basal diam:  3.40 cm RV S prime:     13.50 cm/s TAPSE (M-mode): 2.2 cm LEFT ATRIUM             Index       RIGHT ATRIUM           Index LA diam:        3.60 cm 1.42 cm/m  RA Area:     16.90 cm LA Vol (A2C):   84.2 ml 33.26 ml/m RA Volume:   44.50 ml  17.58 ml/m LA Vol (A4C):   58.8 ml 23.22 ml/m LA Biplane Vol: 74.0 ml 29.23 ml/m  AORTIC VALVE AV Area (Vmax):    2.96 cm AV Area (Vmean):   2.73 cm AV Area (VTI):     3.12 cm AV Vmax:           135.00 cm/s AV Vmean:          91.800 cm/s AV VTI:            0.252 m AV Peak Grad:      7.3 mmHg AV Mean Grad:      4.0 mmHg LVOT Vmax:         105.00 cm/s LVOT Vmean:        66.000 cm/s LVOT VTI:          0.207 m LVOT/AV VTI ratio: 0.82  AORTA Ao Root diam: 3.10 cm Ao Asc diam:  2.80 cm MITRAL VALVE MV Area (PHT): 2.99 cm    SHUNTS MV Decel Time: 254 msec    Systemic VTI:  0.21 m MV E velocity: 84.40 cm/s  Systemic Diam: 2.20 cm MV A velocity: 54.80 cm/s MV E/A ratio:  1.54 Epifanio Lescheshristopher Schumann MD Electronically signed by Epifanio Lescheshristopher Schumann MD Signature Date/Time: 06/24/2021/5:37:13 PM    Final     LE venous Doppler - no DVT  ECG  - SR rate  71 BPM. (See cardiology reading for complete details)  PHYSICAL EXAM  Temp:  [97.8 F (36.6 C)-98.6 F (37 C)] 97.9 F (36.6 C) (08/23 2051) Pulse Rate:  [62-72] 72 (08/23 2051) Resp:  [14-19] 18 (08/23 2051) BP: (113-152)/(79-101) 113/80 (08/23 2051) SpO2:  [99 %-100 %] 99 % (08/23 2051)  General - Well nourished, well developed, in no apparent distress.  Ophthalmologic - fundi not visualized due to noncooperation.  Cardiovascular - Regular rhythm and rate.  Mental Status -  Level of arousal and orientation to time, place, and person were intact. Language including expression, naming, repetition, comprehension was assessed and found intact. Attention span and concentration were normal. Fund of Knowledge was assessed and was intact.  Cranial Nerves II - XII - II - Visual field intact OU. III, IV, VI - Extraocular movements intact. V - Facial sensation intact bilaterally. VII - Facial movement intact bilaterally. VIII - Hearing & vestibular intact bilaterally. X - Palate elevates symmetrically. XI - Chin turning & shoulder shrug intact bilaterally. XII - Tongue protrusion intact.  Motor Strength - The patient's strength was normal in all extremities except slight pronator drift on the left.  Bulk was normal and fasciculations were absent.   Motor Tone - Muscle tone was assessed at the neck and appendages and was normal.  Reflexes - The patient's reflexes were symmetrical in all extremities and she had no pathological reflexes.  Sensory - Light touch, temperature/pinprick were assessed and were symmetrical.    Coordination - The patient had normal movements in the hands with no ataxia or dysmetria.  Tremor was absent.  Gait and Station - deferred.   ASSESSMENT/PLAN Danielle Kelley is a 40 y.o. female with no significant past medical history who presented with left upper and lower extremity weakness and paresthesias. She did not receive IV t-PA due to late  presentation (>4.5 hours from time of onset).  Stroke - periventricular right posterior frontal corona radiata - likely small vessel disease but need to complete work-up. CT head -normal MRI head - Findings compatible with a small acute infarct in the periventricular right posterior frontal corona radiata, immediately superior to and potentially involving the right thalamus. MRA head - no significant stenosis CTA Neck - normal 2D Echo - EF 60 - 65%. No cardiac source of emboli identified.  LE venous Doppler no DVT TEE pending in a.m. LDL - 120 HgbA1c - 5.9 UDS - negative VTE prophylaxis - Lovenox No antithrombotic prior to admission, now on aspirin 81 mg daily and Plavix 75 DAPT for 3 weeks and then aspirin alone. Patient will be counseled to be compliant with her antithrombotic medications Ongoing aggressive stroke risk factor management Therapy recommendations:  pending Disposition:  Pending  Hypertension Home BP meds: none  Current BP 130s-160s Stable Long-term BP goal normotensive  Hyperlipidemia Home Lipid lowering medication: none  LDL 120, goal < 70 Current lipid lowering medication: Lipitor 40 mg daily  Continue statin at discharge  Other Stroke Risk Factors Morbid obesity, Body mass index is 45.2 kg/m., recommend weight loss, diet and exercise as appropriate   Other Active Problems, Findings, Recommendations and/or Plan Code status - Full code Mild hypokalemia, K 3.4   Hospital day # 0  Marvel Plan, MD PhD Stroke Neurology 06/25/2021 10:54 PM   To contact Stroke Continuity provider, please refer to [email or web address]. After hours, contact General Neurology

## 2021-06-25 NOTE — Progress Notes (Addendum)
[name]  S Saw patient at bedside this evening. Patient was sleeping comfortably. I did not wake the patient.  O: BP (!) 135/96 (BP Location: Right Arm)   Pulse 66   Temp 98.6 F (37 C) (Oral)   Resp 16   Ht 5\' 10"  (1.778 m)   Wt (!) 142.9 kg   LMP 06/11/2021 (Exact Date)   SpO2 100%   BMI 45.20 kg/m    General: sleeping comfortably  A/P: Plan per day team  -NPO MN for TEE -Pending CIR placement  - Orders reviewed. Labs for AM not ordered, which was adjusted as needed.    08/11/2021, MD 06/25/2021, 8:15 PM PGY-3, [facility] Night Resident  Please page [phone] with questions.

## 2021-06-25 NOTE — Plan of Care (Signed)
  Problem: Education: Goal: Knowledge of General Education information will improve Description: Including pain rating scale, medication(s)/side effects and non-pharmacologic comfort measures Outcome: Progressing   Problem: Health Behavior/Discharge Planning: Goal: Ability to manage health-related needs will improve Outcome: Progressing   Problem: Clinical Measurements: Goal: Ability to maintain clinical measurements within normal limits will improve Outcome: Progressing Goal: Will remain free from infection Outcome: Progressing Goal: Diagnostic test results will improve Outcome: Progressing Goal: Respiratory complications will improve Outcome: Progressing Goal: Cardiovascular complication will be avoided Outcome: Progressing   Problem: Activity: Goal: Risk for activity intolerance will decrease Outcome: Progressing   Problem: Nutrition: Goal: Adequate nutrition will be maintained Outcome: Progressing   Problem: Coping: Goal: Level of anxiety will decrease Outcome: Progressing   Problem: Elimination: Goal: Will not experience complications related to bowel motility Outcome: Progressing Goal: Will not experience complications related to urinary retention Outcome: Progressing   Problem: Pain Managment: Goal: General experience of comfort will improve Outcome: Progressing   Problem: Safety: Goal: Ability to remain free from injury will improve Outcome: Progressing   Problem: Skin Integrity: Goal: Risk for impaired skin integrity will decrease Outcome: Progressing   Problem: Education: Goal: Knowledge of disease or condition will improve Outcome: Progressing Goal: Knowledge of secondary prevention will improve Outcome: Progressing Goal: Knowledge of patient specific risk factors addressed and post discharge goals established will improve Outcome: Progressing   Problem: Health Behavior/Discharge Planning: Goal: Ability to manage health-related needs will  improve Outcome: Progressing   Problem: Self-Care: Goal: Ability to participate in self-care as condition permits will improve Outcome: Progressing   Problem: Intracerebral Hemorrhage Tissue Perfusion: Goal: Complications of Intracerebral Hemorrhage will be minimized Outcome: Progressing   Problem: Ischemic Stroke/TIA Tissue Perfusion: Goal: Complications of ischemic stroke/TIA will be minimized Outcome: Progressing

## 2021-06-25 NOTE — Hospital Course (Addendum)
Danielle Kelley is a 40 y.o. female who presented with left sided weakness with hx of obesity and seasonal allergies.   Acute CVA  Patient reports that she began to have left sided weakness in her upper and lower extremities on the night prior to admission. She noticed difficulty with ambulating which prompted her to go to the ED for further evaluation. Head CT did not show any abnormalities, brain MRI showed acute infarct in the periventricular frontal corona radiata. She was evaluated by neurology and started on atorvastatin and aspirin. MRA did not show large vessel occlusion nor stenosis. Labs on admission were unremarkable. Echo was obtained and showed ejection fraction of 60 to 65%, mild LVH. Left ventricular diastolic parameters were normal Lipid panel was notable for LDL 120, patient was started on atorvastatin 40 mg daily. DVT US showed no abnormalities.  TEE showed no abnormalities, no PFO. Homocysteine and cardiolipin antibody levels were normal, and remainder of autoimmune labs pending at discharge. Patient hemodynamically stable at discharge with no new focal neurologic deficits   Issues for PCP follow up  LDL greater than 120 (goal <70).  Patient started on atorvastatin 40 mg daily. Consider repeat lipid panel in 6 weeks.  Follow-up with neurology outpatient in 1 month. Patient to continue aspirin and Plavix 75 DAPT for 3 weeks, and then aspirin alone.  Ensure patient continues to take aspirin. Continue to educate on risk factor modification. Iron-deficiency anemia. Started on iron-sulfate. Consider further workup for etiology of this. Follow up on ANA, lupus anticoagulant

## 2021-06-25 NOTE — Progress Notes (Signed)
STROKE TEAM PROGRESS NOTE   INTERVAL HISTORY No family is at the bedside.  Patient lying in bed, awake alert orientated.  Neuro intact except mild left upper extremity drift.  She denies any OCP use, smoking, alcohol or illicit drug use.  Examined any migraine headache, confusion or cognitive impairment.  She does admitted that she not eating healthy and did not control her diet.  Of note, BMI 45.2.  OBJECTIVE Vitals:   06/24/21 2032 06/24/21 2332 06/25/21 0407 06/25/21 0724  BP: (!) 152/86 (!) 146/79 (!) 152/101 128/82  Pulse: 64 71 62 66  Resp: Temp: 98 F (36.7 C) 97.8 F (36.6 C) 98 F (36.7 C) 98.5 F (36.9 C)  TempSrc: Oral Oral Oral Oral  SpO2: 99% 99% 100% 100%  Weight:      Height:        CBC:  Recent Labs  Lab 06/24/21 0942 06/24/21 1555 06/25/21 0326  WBC 5.8 7.2 5.8  NEUTROABS 3.4  --   --   HGB 12.0 12.2 10.9*  HCT 37.5 37.9 33.9*  MCV 81.5 81.9 80.5  PLT 456* 454* 421*    Basic Metabolic Panel:  Recent Labs  Lab 06/24/21 0942 06/25/21 0326  NA 137 137  K 3.9 3.4*  CL 106 105  CO2 24 25  GLUCOSE 95 100*  BUN 6 6  CREATININE 0.76 0.76  CALCIUM 8.9 8.8*    Lipid Panel:     Component Value Date/Time   CHOL 196 06/24/2021 1606   TRIG 148 06/24/2021 1606   HDL 46 06/24/2021 1606   CHOLHDL 4.3 06/24/2021 1606   VLDL 30 06/24/2021 1606   LDLCALC 120 (H) 06/24/2021 1606   HgbA1c:  Lab Results  Component Value Date   HGBA1C 5.9 (H) 06/24/2021   Urine Drug Screen:     Component Value Date/Time   LABOPIA NONE DETECTED 06/25/2021 0539   COCAINSCRNUR NONE DETECTED 06/25/2021 0539   LABBENZ NONE DETECTED 06/25/2021 0539   AMPHETMU NONE DETECTED 06/25/2021 0539   THCU NONE DETECTED 06/25/2021 0539   LABBARB NONE DETECTED 06/25/2021 0539    Alcohol Level No results found for: ETH  IMAGING   CT HEAD WO CONTRAST ( )  Result Date: 06/24/2021 CLINICAL DATA:  Neuro deficit, acute, stroke suspected. Left-sided weakness beginning  yesterday. EXAM: CT HEAD WITHOUT CONTRAST TECHNIQUE: Contiguous axial images were obtained from the base of the skull through the vertex without intravenous contrast. COMPARISON:  None. FINDINGS: Brain: The brain shows a normal appearance without evidence of malformation, atrophy, old or acute small or large vessel infarction, mass lesion, hemorrhage, hydrocephalus or extra-axial collection. Vascular: No hyperdense vessel. No evidence of atherosclerotic calcification. Skull: Normal.  No traumatic finding.  No focal bone lesion. Sinuses/Orbits: Sinuses are clear. Orbits appear normal. Mastoids are clear. Other: None significant IMPRESSION: Normal head CT. Electronically Signed   By: Paulina Fusi M.D.   On: 06/24/2021 10:34   CT ANGIO NECK W OR WO CONTRAST  Result Date: 06/24/2021 CLINICAL DATA:  Stroke/TIA, determine embolic source EXAM: CT ANGIOGRAPHY NECK TECHNIQUE: Multidetector CT imaging of the neck was performed using the standard protocol during bolus administration of intravenous contrast. Multiplanar CT image reconstructions and MIPs were obtained to evaluate the vascular anatomy. Carotid stenosis measurements (when applicable) are obtained utilizing NASCET criteria, using the distal internal carotid diameter as the denominator. CONTRAST:  75mL OMNIPAQUE IOHEXOL 350 MG/ML SOLN COMPARISON:  Same day MR/MRA Head FINDINGS: Aortic arch: Standard branching. Imaged portion  shows no evidence of aneurysm or dissection. No significant stenosis of the major arch vessel origins. Right carotid system: No evidence of dissection, stenosis (50% or greater) or occlusion. Left carotid system: No evidence of dissection, stenosis (50% or greater) or occlusion. The left common carotid artery takes a medialized/retropharyngeal course. Vertebral arteries: Codominant. No evidence of dissection, stenosis (50% or greater) or occlusion. Skeleton: There is no acute osseous abnormality. Other neck: The soft tissues are  unremarkable. Upper chest: The lung apices are clear. IMPRESSION: Normal CTA neck. Electronically Signed   By: Lesia Hausen M.D.   On: 06/24/2021 19:46   MR ANGIO HEAD WO CONTRAST  Result Date: 06/24/2021 CLINICAL DATA:  Demyelinating disease; Neuro deficit, acute, stroke suspected EXAM: MRI HEAD WITHOUT CONTRAST MRA HEAD WITHOUT CONTRAST TECHNIQUE: Multiplanar, multi-echo pulse sequences of the brain and surrounding structures were acquired without intravenous contrast. Angiographic images of the Circle of Willis were acquired using MRA technique without intravenous contrast. COMPARISON:  No pertinent prior exam. FINDINGS: MRI HEAD FINDINGS Brain: Focus of restricted diffusion in the periventricular right posterior frontal corona radiata, immediately superior to and potentially involving the right thalamus. Faint edema without mass effect. No other areas of T2 hyperintensity within the white matter. No hydrocephalus, mass lesion, abnormal mass effect, acute hemorrhage, extra-axial fluid collection. Small dilated perivascular spaces in the inferior basal ganglia bilaterally. Vascular: See below. Skull and upper cervical spine: Normal marrow signal. Sinuses/Orbits: Clear sinuses.  Unremarkable orbits. Other: No mastoid effusions. MRA HEAD FINDINGS Anterior circulation: Bilateral intracranial ICAs, MCAs, and ACAs are patent without proximal hemodynamically significant stenosis. No aneurysm identified. Posterior circulation: The visualized intradural vertebral arteries are patent without significant stenosis. Bilateral PICAs are patent. The basilar artery and bilateral posterior cerebral arteries are patent without proximal hemodynamically significant stenosis. There is a prominent right-sided posterior communicating artery with a small and tortuous right P1 PCA coursing anteriorly and joining the posterior communicating artery, anatomic variant. No aneurysm identified. Anatomic variants: Detailed above.  IMPRESSION: MRI: Findings compatible with a small acute infarct in the periventricular right posterior frontal corona radiata, immediately superior to and potentially involving the right thalamus. Faint edema without mass effect. MRA: No large vessel occlusion or proximal hemodynamically significant stenosis. Electronically Signed   By: Feliberto Harts M.D.   On: 06/24/2021 13:56   MR BRAIN WO CONTRAST  Result Date: 06/24/2021 CLINICAL DATA:  Demyelinating disease; Neuro deficit, acute, stroke suspected EXAM: MRI HEAD WITHOUT CONTRAST MRA HEAD WITHOUT CONTRAST TECHNIQUE: Multiplanar, multi-echo pulse sequences of the brain and surrounding structures were acquired without intravenous contrast. Angiographic images of the Circle of Willis were acquired using MRA technique without intravenous contrast. COMPARISON:  No pertinent prior exam. FINDINGS: MRI HEAD FINDINGS Brain: Focus of restricted diffusion in the periventricular right posterior frontal corona radiata, immediately superior to and potentially involving the right thalamus. Faint edema without mass effect. No other areas of T2 hyperintensity within the white matter. No hydrocephalus, mass lesion, abnormal mass effect, acute hemorrhage, extra-axial fluid collection. Small dilated perivascular spaces in the inferior basal ganglia bilaterally. Vascular: See below. Skull and upper cervical spine: Normal marrow signal. Sinuses/Orbits: Clear sinuses.  Unremarkable orbits. Other: No mastoid effusions. MRA HEAD FINDINGS Anterior circulation: Bilateral intracranial ICAs, MCAs, and ACAs are patent without proximal hemodynamically significant stenosis. No aneurysm identified. Posterior circulation: The visualized intradural vertebral arteries are patent without significant stenosis. Bilateral PICAs are patent. The basilar artery and bilateral posterior cerebral arteries are patent without proximal hemodynamically significant stenosis. There is  a prominent  right-sided posterior communicating artery with a small and tortuous right P1 PCA coursing anteriorly and joining the posterior communicating artery, anatomic variant. No aneurysm identified. Anatomic variants: Detailed above. IMPRESSION: MRI: Findings compatible with a small acute infarct in the periventricular right posterior frontal corona radiata, immediately superior to and potentially involving the right thalamus. Faint edema without mass effect. MRA: No large vessel occlusion or proximal hemodynamically significant stenosis. Electronically Signed   By: Feliberto Harts M.D.   On: 06/24/2021 13:56   ECHOCARDIOGRAM COMPLETE BUBBLE STUDY  Result Date: 06/24/2021    ECHOCARDIOGRAM REPORT   Patient Name:   Danielle Kelley Date of Exam: 06/24/2021 Medical Rec #:  [id number]      Height:       70.0 in Accession #:    [id number]     Weight:       315.0 lb Date of Birth:  [birth year]      BSA:          2.532 m Patient Age:    39 years       BP:           110/95 mmHg Patient Gender: F              HR:           72 bpm. Exam Location:  Inpatient Procedure: 2D Echo, Cardiac Doppler, Color Doppler and Saline Contrast Bubble            Study Indications:    Stroke  History:        Patient has prior history of Echocardiogram examinations. Risk                 Factors:Morbid Obesity.  Sonographer:    Roosvelt Maser RDCS Referring Phys: [id number] [facility] O YADAV IMPRESSIONS  1. Left ventricular ejection fraction, by estimation, is 60 to 65%. The left ventricle has normal function. The left ventricle has no regional wall motion abnormalities. There is mild left ventricular hypertrophy. Left ventricular diastolic parameters were normal.  2. Right ventricular systolic function is normal. The right ventricular size is normal. Tricuspid regurgitation signal is inadequate for assessing PA pressure.  3. The mitral valve is normal in structure. Trivial mitral valve regurgitation. No evidence of mitral stenosis.  4. The aortic valve is  tricuspid. Aortic valve regurgitation is not visualized. No aortic stenosis is present.  5. The inferior vena cava is normal in size with greater than 50% respiratory variability, suggesting right atrial pressure of 3 mmHg.  6. Agitated saline contrast bubble study was done. While no clear shunt was seen, bubble study was technically difficult with poor visualization of cardiac chambers. Consider TEE for further evaluation Conclusion(s)/Recommendation(s): No intracardiac source of embolism detected on this transthoracic study. A transesophageal echocardiogram is recommended to exclude cardiac source of embolism if clinically indicated. FINDINGS  Left Ventricle: Left ventricular ejection fraction, by estimation, is 60 to 65%. The left ventricle has normal function. The left ventricle has no regional wall motion abnormalities. The left ventricular internal cavity size was normal in size. There is  mild left ventricular hypertrophy. Left ventricular diastolic parameters were normal. Right Ventricle: The right ventricular size is normal. No increase in right ventricular wall thickness. Right ventricular systolic function is normal. Tricuspid regurgitation signal is inadequate for assessing PA pressure. Left Atrium: Left atrial size was normal in size. Right Atrium: Right atrial size was normal in size. Pericardium: There is no evidence of pericardial effusion. Mitral Valve: The  mitral valve is normal in structure. Trivial mitral valve regurgitation. No evidence of mitral valve stenosis. Tricuspid Valve: The tricuspid valve is normal in structure. Tricuspid valve regurgitation is trivial. Aortic Valve: The aortic valve is tricuspid. Aortic valve regurgitation is not visualized. No aortic stenosis is present. Aortic valve mean gradient measures 4.0 mmHg. Aortic valve peak gradient measures 7.3 mmHg. Aortic valve area, by VTI measures 3.12 cm. Pulmonic Valve: The pulmonic valve was not well visualized. Pulmonic valve  regurgitation is not visualized. Aorta: The aortic root and ascending aorta are structurally normal, with no evidence of dilitation. Venous: The inferior vena cava is normal in size with greater than 50% respiratory variability, suggesting right atrial pressure of 3 mmHg. IAS/Shunts: The interatrial septum was not well visualized. Agitated saline contrast was given intravenously to evaluate for intracardiac shunting. Agitated saline contrast bubble study was negative, with no evidence of any interatrial shunt.  LEFT VENTRICLE PLAX 2D LVIDd:         5.10 cm  Diastology LVIDs:         3.10 cm  LV e' medial:    11.00 cm/s LV PW:         1.00 cm  LV E/e' medial:  7.7 LV IVS:        0.80 cm  LV e' lateral:   10.70 cm/s LVOT diam:     2.20 cm  LV E/e' lateral: 7.9 LV SV:         79 LV SV Index:   31 LVOT Area:     3.80 cm  RIGHT VENTRICLE RV Basal diam:  3.40 cm RV S prime:     13.50 cm/s TAPSE (M-mode): 2.2 cm LEFT ATRIUM             Index       RIGHT ATRIUM           Index LA diam:        3.60 cm 1.42 cm/m  RA Area:     16.90 cm LA Vol (A2C):   84.2 ml 33.26 ml/m RA Volume:   44.50 ml  17.58 ml/m LA Vol (A4C):   58.8 ml 23.22 ml/m LA Biplane Vol: 74.0 ml 29.23 ml/m  AORTIC VALVE AV Area (Vmax):    2.96 cm AV Area (Vmean):   2.73 cm AV Area (VTI):     3.12 cm AV Vmax:           135.00 cm/s AV Vmean:          91.800 cm/s AV VTI:            0.252 m AV Peak Grad:      7.3 mmHg AV Mean Grad:      4.0 mmHg LVOT Vmax:         105.00 cm/s LVOT Vmean:        66.000 cm/s LVOT VTI:          0.207 m LVOT/AV VTI ratio: 0.82  AORTA Ao Root diam: 3.10 cm Ao Asc diam:  2.80 cm MITRAL VALVE MV Area (PHT): 2.99 cm    SHUNTS MV Decel Time: 254 msec    Systemic VTI:  0.21 m MV E velocity: 84.40 cm/s  Systemic Diam: 2.20 cm MV A velocity: 54.80 cm/s MV E/A ratio:  1.54 Epifanio Lescheshristopher Schumann MD Electronically signed by Epifanio Lescheshristopher Schumann MD Signature Date/Time: 06/24/2021/5:37:13 PM    Final     LE venous Doppler - no DVT  ECG  - SR rate  71 BPM. (See cardiology reading for complete details)  PHYSICAL EXAM  Temp:  [97.8 F (36.6 C)-98.6 F (37 C)] 97.9 F (36.6 C) (08/23 2051) Pulse Rate:  [62-72] 72 (08/23 2051) Resp:  [14-19] 18 (08/23 2051) BP: (113-152)/(79-101) 113/80 (08/23 2051) SpO2:  [99 %-100 %] 99 % (08/23 2051)  General - Well nourished, well developed, in no apparent distress.  Ophthalmologic - fundi not visualized due to noncooperation.  Cardiovascular - Regular rhythm and rate.  Mental Status -  Level of arousal and orientation to time, place, and person were intact. Language including expression, naming, repetition, comprehension was assessed and found intact. Attention span and concentration were normal. Fund of Knowledge was assessed and was intact.  Cranial Nerves II - XII - II - Visual field intact OU. III, IV, VI - Extraocular movements intact. V - Facial sensation intact bilaterally. VII - Facial movement intact bilaterally. VIII - Hearing & vestibular intact bilaterally. X - Palate elevates symmetrically. XI - Chin turning & shoulder shrug intact bilaterally. XII - Tongue protrusion intact.  Motor Strength - The patient's strength was normal in all extremities except slight pronator drift on the left.  Bulk was normal and fasciculations were absent.   Motor Tone - Muscle tone was assessed at the neck and appendages and was normal.  Reflexes - The patient's reflexes were symmetrical in all extremities and she had no pathological reflexes.  Sensory - Light touch, temperature/pinprick were assessed and were symmetrical.    Coordination - The patient had normal movements in the hands with no ataxia or dysmetria.  Tremor was absent.  Gait and Station - deferred.   ASSESSMENT/PLAN Danielle Kelley is a 40 y.o. female with no significant past medical history who presented with left upper and lower extremity weakness and paresthesias. She did not receive IV t-PA due to late  presentation (>4.5 hours from time of onset).  Stroke - periventricular right posterior frontal corona radiata - likely small vessel disease but need to complete work-up. CT head -normal MRI head - Findings compatible with a small acute infarct in the periventricular right posterior frontal corona radiata, immediately superior to and potentially involving the right thalamus. MRA head - no significant stenosis CTA Neck - normal 2D Echo - EF 60 - 65%. No cardiac source of emboli identified.  LE venous Doppler no DVT TEE pending in a.m. LDL - 120 HgbA1c - 5.9 UDS - negative VTE prophylaxis - Lovenox No antithrombotic prior to admission, now on aspirin 81 mg daily and Plavix 75 DAPT for 3 weeks and then aspirin alone. Patient will be counseled to be compliant with her antithrombotic medications Ongoing aggressive stroke risk factor management Therapy recommendations:  pending Disposition:  Pending  Hypertension Home BP meds: none  Current BP 130s-160s Stable Long-term BP goal normotensive  Hyperlipidemia Home Lipid lowering medication: none  LDL 120, goal < 70 Current lipid lowering medication: Lipitor 40 mg daily  Continue statin at discharge  Other Stroke Risk Factors Morbid obesity, Body mass index is 45.2 kg/m., recommend weight loss, diet and exercise as appropriate   Other Active Problems, Findings, Recommendations and/or Plan Code status - Full code Mild hypokalemia, K 3.4   Hospital day # 0  Marvel Plan, MD PhD Stroke Neurology 06/25/2021 10:54 PM   To contact Stroke Continuity provider, please refer to [email or web address]. After hours, contact General Neurology

## 2021-06-25 NOTE — Evaluation (Signed)
Occupational Therapy Evaluation Patient Details Name: Danielle Kelley MRN: [id number] DOB: [birth year] Today's Date: 06/25/2021    History of Present Illness Pt is 40 yo female who presents with L sided weakness and paresthesias. 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  PMH: gastric bypass sx   Clinical Impression   PTA, pt was living at home with her mother, who recently had a stroke but is independent, pt's 2 daughter and was planning to move oldest daughter into college this weekend. Pt was fully independent with ADL/IADL and functional mobility. Pt was working in [employer] and is able to continue to work from home. Pt currently requires minA at [facility] for functional mobility and minA for grooming while standing at sink level. She demonstrates LUE/LLE weakness, decreased coordination and decreased sensation. Pt demonstrates slight inattention to LUE requiring cues to initiate use of LUE during transfer and during ADL. Due to decline in current level of function, pt would benefit from acute OT to address established goals to facilitate safe D/C to venue listed below. At this time, recommend CIR follow-up. Will continue to follow acutely.     Follow Up Recommendations  CIR    Equipment Recommendations  None recommended by OT    Recommendations for Other Services       Precautions / Restrictions Precautions Precautions: Fall Restrictions Weight Bearing Restrictions: No      Mobility Bed Mobility Overal bed mobility: Needs Assistance Bed Mobility: Sit to Supine     Supine to sit: Supervision Sit to supine: Supervision   General bed mobility comments: supervision for safety, pt grabbed sock on L foot to assist with progression into bed    Transfers Overall transfer level: Needs assistance Equipment used: Rolling walker (2 wheeled) Transfers: Sit to/from Stand Sit to Stand: Min assist         General transfer comment: min A for hand  placement and increased time needed. R lean with use of momentum. LUE extended at shoulder and went behind pt when she exerted herself to stand. Min A to steady and assist L hand to RW.    Balance Overall balance assessment: Needs assistance Sitting-balance support: Feet supported;No upper extremity supported Sitting balance-Leahy Scale: Good     Standing balance support: Single extremity supported;During functional activity Standing balance-Leahy Scale: Fair Standing balance comment: able to maintain static stance but maintains R lean, improved stability in standing with single UE support                           ADL either performed or assessed with clinical judgement   ADL Overall ADL's : Needs assistance/impaired Eating/Feeding: Modified independent;Sitting   Grooming: Wash/dry hands;Oral care;Minimal assistance;Standing Grooming Details (indicate cue type and reason): at sink level Upper Body Bathing: Set up;Sitting   Lower Body Bathing: Min guard;Sit to/from stand   Upper Body Dressing : Set up;Sitting   Lower Body Dressing: Minimal assistance;Sit to/from stand Lower Body Dressing Details (indicate cue type and reason): pt re Toilet Transfer: Minimal assistance;Ambulation;RW   Toileting- Clothing Manipulation and Hygiene: Minimal assistance;Sit to/from stand       Functional mobility during ADLs: Minimal assistance;Rolling walker General ADL Comments: minA for safety and stability, vc for safe hand placement with mobility and cues to incorporate LUE during bimanual tasks;increased time and instability noted during tasks while standing at sink level     Vision Baseline Vision/History: 1 Wears glasses Ability to See in Adequate  Light: 0 Adequate Patient Visual Report: No change from baseline Vision Assessment?: No apparent visual deficits     Perception     Praxis      Pertinent Vitals/Pain Pain Assessment: No/denies pain     Hand Dominance Right    Extremity/Trunk Assessment Upper Extremity Assessment Upper Extremity Assessment: LUE deficits/detail LUE Deficits / Details: decreased fine motor skills, decreased coordination, pt reports sensation is not as strong compared to RUE; Pt requires cues to intiate use of LUE. Strength grossly 3-/5. LUE Sensation: decreased light touch LUE Coordination: decreased fine motor;decreased gross motor   Lower Extremity Assessment Lower Extremity Assessment: Defer to PT evaluation LLE Deficits / Details: hip flex 3-/5, knee ext 3/5, ankle df 3/5, all mvmts very slow and labored LLE Sensation: decreased proprioception;decreased light touch (tingling) LLE Coordination: decreased gross motor   Cervical / Trunk Assessment Cervical / Trunk Assessment: Normal (increased cervical tension with gait)   Communication Communication Communication: Expressive difficulties (mild)   Cognition Arousal/Alertness: Awake/alert Behavior During Therapy: WFL for tasks assessed/performed Overall Cognitive Status: Impaired/Different from baseline Area of Impairment: Problem solving                             Problem Solving: Difficulty sequencing;Requires verbal cues General Comments: mildly decreased attention to L side. Overthinks commands and slow problem solving and sequencing.   General Comments  educated pt on neuroplasticity    Exercises     Shoulder Instructions      Home Living Family/patient expects to be discharged to:: Private residence Living Arrangements: Parent;Children Available Help at Discharge: Family;Available 24 hours/day Type of Home: House Home Access: Stairs to enter [employer] of Steps: 4 Entrance Stairs-Rails: Right Home Layout: Two level Alternate Level Stairs-Number of Steps: flight Alternate Level Stairs-Rails: Right           Home Equipment: None   Additional Comments: pt's mom who had a CVA, lives with her and is independent with self care  but does not drive. 2 teenagers at home as well.  Lives With: Other (Comment) (mother lives with her and her children live with her)    Prior Functioning/Environment Level of Independence: Independent        Comments: works in [employer] for [employer], can work from home        OT Problem List: Decreased activity tolerance;Impaired balance (sitting and/or standing);Decreased safety awareness;Impaired UE functional use;Impaired sensation;Decreased coordination;Decreased strength      OT Treatment/Interventions: Self-care/ADL training;Therapeutic exercise;Energy conservation;DME and/or AE instruction;Neuromuscular education;Therapeutic activities;Patient/family education;Balance training    OT Goals(Current goals can be found in the care plan section) Acute Rehab OT Goals Patient Stated Goal: return to home and work OT Goal Formulation: With patient Time For Goal Achievement: 07/09/21 Potential to Achieve Goals: Good ADL Goals Pt Will Perform Grooming: Independently;standing Pt Will Perform Lower Body Dressing: Independently;sit to/from stand Pt Will Transfer to Toilet: Independently;ambulating Additional ADL Goal #1: Pt will independently initiate use of LUE throughout functioanl mobiltiy and ADL completion.  OT Frequency: Min 2X/week   Barriers to D/C:            Co-evaluation              AM-PAC OT "6 Clicks" Daily Activity     Outcome Measure Help from another person eating meals?: None Help from another person taking care of personal grooming?: A Little Help from another person toileting, which includes using toliet, bedpan, or urinal?: A  Little Help from another person bathing (including washing, rinsing, drying)?: A Little Help from another person to put on and taking off regular upper body clothing?: None Help from another person to put on and taking off regular lower body clothing?: A Little 6 Click Score: 20   End of Session Equipment Utilized During Treatment: Gait  belt;Rolling walker Nurse Communication: Mobility status  Activity Tolerance: Patient tolerated treatment well Patient left: in bed;with call bell/phone within reach;with bed alarm set;Other (comment) (vascular present)  OT Visit Diagnosis: Other abnormalities of gait and mobility (R26.89);Muscle weakness (generalized) (M62.81);Other symptoms and signs involving cognitive function                Time: 1478-2956 OT Time Calculation (min): 32 min Charges:  OT General Charges $OT Visit: 1 Visit OT Evaluation $OT Eval Moderate Complexity: 1 Mod OT Treatments $Self Care/Home Management : 8-22 mins  Rosey Bath OTR/L Acute Rehabilitation Services Office: [phone]   Rebeca Alert 06/25/2021, 1:32 PM

## 2021-06-25 NOTE — Progress Notes (Addendum)
Family Medicine Teaching Service Daily Progress Note Intern Pager: [phone]  Patient name: Danielle Kelley Medical record number: [id number] Date of birth: Oct 12, 1981 Age: 40 y.o. 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 Continued left sided-weakness. Unsteady gait, takes a while to get out of the bed, needs handrail/assistance to walk.  Speech was fluent and clear this morning.  No new focal neurologic deficits.Consider more exhuastive evaluation with bubble study for PFO given lack of risk factors.  Patient was concerned that this happened and does not understand why she does not have any significant risk factors other than obesity.  Patient is a non-smoker, does not have any other significant medical conditions. Pursuing a rheumatologic workup for hypercoagulable autoimmune conditions that may place the patient at higher risk of clotting, per neuro. - Vital signs every 4 hours - Fall precautions - PT/OT/SLP eval and treat  - Neurology following, appreciate recommendations - ASA 81 mg - Pending ANA, homocysteine, cardiolipin, lupus anticoagulant,sed rate, CRP  Anemia Hgb 10.9 this morning. Asymptomatic. Likely iron-deficient as patient is menstruating age, LMP 06/11/2021. Will follow up with patient if she has chronic anemia given s/p gastric bypass. - Monitor Hgb with daily labs  Hypertension BP 140s-140s/90s-100 overnight. BP 128/82 this morning. No visual disturbances. - Labetolol 5mg  IV q3310min PRN for SBP>180  Low Vitamin B12 B12 level 141 - Continue Vitamin B12 tablet 1000mcg daily  Hyperlipidemia Lipid profile: LDL 120. Started Atorvostatin  40mg  - Continue Atorvostatin 40 mg  Seasonal allergies Chronic, stable - Continue Claritin 10 mg daily  FEN/GI: Regular PPx: Lovenox Dispo:Pending PT recommendations  pending clinical improvement . Barriers include neurology, PT, SLP evaluations.   Subjective:  Danielle Kelley was very pleasant and conversational this morning.  She endorses a mild headache, that is behind the eyes and is worsened by the lights in the room.  No nausea or vomiting.  She was eating a graham cracker before he came in the room, which she choked on a little bit probably from laying flat in the bed. She does have continued left-sided upper extremity and lower extremity weakness.  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  She is disappointed that she would not be able to fly up to [place] to help move her daughter into her college dorm.  Objective: Temp:  [97.8 F (36.6 C)-98.5 F (36.9 C)] 98.5 F (36.9 C) (08/23 0724) Pulse Rate:  [62-86] 66 (08/23 0724) Resp:  [9-23] 14 (08/23 0724) BP: (110-159)/(77-108) 128/82 (08/23 0724) SpO2:  [98 %-100 %] 100 % (08/23 0724) Weight:  [142.9 kg] 142.9 kg (08/22 0918) Physical Exam: General: Well-appearing, pleasant, conversational, no acute distress, resting in the bed Cardiovascular: Regular rate and rhythm, no murmurs appreciated Respiratory: Normal work of breathing, good aeration throughout all lung fields, no wheezing or crackles Abdomen: Soft, obese, nontender, nondistended Extremities: Warm, dry, well perfused Neurologic: Speech is clear and fluent.  No aphasia. Awake, alert, oriented to person, place, time. Visual fields normal in all quadrants  Extraocular movement intact without ptosis. Facial muscle strength normal and intact bilaterally. Voice is normal.  Diminished 3/5 strength in upper left extremity, 5/5  normal strength in upper right extremity. Wiggles toes bilaterally. Gait unsteady, shuffles feet, takes  very slow steps. Requires use of countertop to turn around while talking.   Laboratory: Recent Labs  Lab 06/24/21 0942 06/24/21 1555 06/25/21 0326  WBC 5.8 7.2 5.8  HGB 12.0 12.2 10.9*  HCT 37.5 37.9 33.9*  PLT 456* 454* 421*   Recent Labs  Lab 06/24/21 0942 06/25/21 0326  NA 137 137  K 3.9 3.4*  CL 106 105  CO2 24 25  BUN 6 6  CREATININE 0.76 0.76  CALCIUM 8.9 8.8*  PROT 7.3 6.5  BILITOT 0.4 0.5  ALKPHOS 61 57  ALT 21 21  AST 19 18  GLUCOSE 95 100*      Imaging/Diagnostic Tests: CT HEAD WO CONTRAST ( )  Result Date: 06/24/2021 CLINICAL DATA:  Neuro deficit, acute, stroke suspected. Left-sided weakness beginning yesterday. EXAM: CT HEAD WITHOUT CONTRAST TECHNIQUE: Contiguous axial images were obtained from the base of the skull through the vertex without intravenous contrast. COMPARISON:  None. FINDINGS: Brain: The brain shows a normal appearance without evidence of malformation, atrophy, old or acute small or large vessel infarction, mass lesion, hemorrhage, hydrocephalus or extra-axial collection. Vascular: No hyperdense vessel. No evidence of atherosclerotic calcification. Skull: Normal.  No traumatic finding.  No focal bone lesion. Sinuses/Orbits: Sinuses are clear. Orbits appear normal. Mastoids are clear. Other: None significant IMPRESSION: Normal head CT. Electronically Signed   By: Paulina Fusi M.D.   On: 06/24/2021 10:34   CT ANGIO NECK W OR WO CONTRAST  Result Date: 06/24/2021 CLINICAL DATA:  Stroke/TIA, determine embolic source EXAM: CT ANGIOGRAPHY NECK TECHNIQUE: Multidetector CT imaging of the neck was performed using the standard protocol during bolus administration of intravenous contrast. Multiplanar CT image reconstructions and MIPs were obtained to evaluate the vascular anatomy. Carotid stenosis measurements (when applicable) are obtained utilizing NASCET criteria, using the distal internal carotid diameter as the denominator. CONTRAST:  75mL OMNIPAQUE  IOHEXOL 350 MG/ML SOLN COMPARISON:  Same day MR/MRA Head FINDINGS: Aortic arch: Standard branching. Imaged portion shows no evidence of aneurysm or dissection. No significant stenosis of the major arch vessel origins. Right carotid system: No evidence of dissection, stenosis (50% or greater) or occlusion. Left carotid system: No evidence of dissection, stenosis (50% or greater) or occlusion. The left common carotid artery takes a medialized/retropharyngeal course. Vertebral arteries: Codominant. No evidence of dissection, stenosis (50% or greater) or occlusion. Skeleton: There is no acute osseous abnormality. Other neck: The soft tissues are unremarkable. Upper chest: The lung apices are clear. IMPRESSION: Normal CTA neck. Electronically Signed   By: Lesia Hausen M.D.   On: 06/24/2021 19:46   MR ANGIO HEAD WO CONTRAST  Result Date: 06/24/2021 CLINICAL DATA:  Demyelinating disease; Neuro deficit, acute, stroke suspected EXAM: MRI HEAD WITHOUT CONTRAST MRA HEAD WITHOUT CONTRAST TECHNIQUE: Multiplanar, multi-echo pulse sequences of the brain and surrounding structures were acquired without intravenous contrast. Angiographic images of the Circle of Willis were acquired using MRA technique without intravenous contrast. COMPARISON:  No pertinent prior exam. FINDINGS: MRI HEAD FINDINGS Brain: Focus of restricted diffusion in the periventricular right posterior frontal corona radiata, immediately superior to and potentially involving the right thalamus. Faint edema without mass effect. No other areas of T2 hyperintensity within the white matter. No hydrocephalus, mass lesion, abnormal mass effect, acute hemorrhage, extra-axial fluid collection. Small dilated perivascular spaces in the inferior basal ganglia bilaterally. Vascular: See below. Skull and upper cervical spine: Normal marrow signal. Sinuses/Orbits: Clear sinuses.  Unremarkable orbits. Other: No mastoid effusions. MRA HEAD FINDINGS Anterior circulation:  Bilateral intracranial ICAs, MCAs, and ACAs are patent without proximal hemodynamically significant stenosis. No aneurysm identified. Posterior circulation: The visualized intradural vertebral arteries are patent without significant stenosis. Bilateral PICAs are patent. The basilar artery and bilateral posterior cerebral arteries are patent without proximal hemodynamically significant stenosis. There is a prominent right-sided posterior communicating artery with a small and tortuous right P1 PCA coursing anteriorly and joining the posterior communicating artery, anatomic variant. No aneurysm identified. Anatomic variants: Detailed above. IMPRESSION: MRI: Findings compatible with a small acute infarct in the periventricular right posterior frontal corona radiata, immediately superior to and potentially involving the right thalamus. Faint edema without mass effect. MRA: No large vessel occlusion or proximal hemodynamically significant stenosis. Electronically Signed   By: Feliberto Harts M.D.   On: 06/24/2021 13:56   MR BRAIN WO CONTRAST  Result Date: 06/24/2021 CLINICAL DATA:  Demyelinating disease; Neuro deficit, acute, stroke suspected EXAM: MRI HEAD WITHOUT CONTRAST MRA HEAD WITHOUT CONTRAST TECHNIQUE: Multiplanar, multi-echo pulse sequences of the brain and surrounding structures were acquired without intravenous contrast. Angiographic images of the Circle of Willis were acquired using MRA technique without intravenous contrast. COMPARISON:  No pertinent prior exam. FINDINGS: MRI HEAD FINDINGS Brain: Focus of restricted diffusion in the periventricular right posterior frontal corona radiata, immediately superior to and potentially involving the right thalamus. Faint edema without mass effect. No other areas of T2 hyperintensity within the white matter. No hydrocephalus, mass lesion, abnormal mass effect, acute hemorrhage, extra-axial fluid collection. Small dilated perivascular spaces in the inferior basal  ganglia bilaterally. Vascular: See below. Skull and upper cervical spine: Normal marrow signal. Sinuses/Orbits: Clear sinuses.  Unremarkable orbits. Other: No mastoid effusions. MRA HEAD FINDINGS Anterior circulation: Bilateral intracranial ICAs, MCAs, and ACAs are patent without proximal hemodynamically significant stenosis. No aneurysm identified. Posterior circulation: The visualized intradural vertebral arteries are patent without significant stenosis. Bilateral PICAs are patent. The basilar artery and bilateral posterior cerebral arteries are patent without proximal hemodynamically significant stenosis. There is a prominent right-sided posterior communicating artery with a small and tortuous right P1 PCA coursing anteriorly and joining the posterior communicating artery, anatomic variant. No aneurysm identified. Anatomic variants: Detailed above. IMPRESSION: MRI: Findings compatible with a small acute infarct in the periventricular right posterior frontal corona radiata, immediately superior to and potentially involving the right thalamus. Faint edema without mass effect. MRA: No large vessel occlusion or proximal hemodynamically significant stenosis. Electronically Signed   By: Feliberto Harts M.D.   On: 06/24/2021 13:56   ECHOCARDIOGRAM COMPLETE BUBBLE STUDY  Result Date: 06/24/2021    ECHOCARDIOGRAM REPORT   Patient Name:   Migdalia Poer Date of Exam: 06/24/2021 Medical Rec #:  [id number]      Height:       70.0 in Accession #:    [id number]     Weight:       315.0 lb Date of Birth:  1980/11/04      BSA:          2.532 m Patient Age:    39 years       BP:           110/95 mmHg Patient Gender: F              HR:           72 bpm. Exam Location:  Inpatient Procedure: 2D Echo, Cardiac Doppler, Color Doppler and Saline Contrast Bubble  Study Indications:    Stroke  History:        Patient has prior history of Echocardiogram examinations. Risk                 Factors:Morbid Obesity.  Sonographer:     Roosvelt Maser RDCS Referring Phys: [id number] [facility] O YADAV IMPRESSIONS  1. Left ventricular ejection fraction, by estimation, is 60 to 65%. The left ventricle has normal function. The left ventricle has no regional wall motion abnormalities. There is mild left ventricular hypertrophy. Left ventricular diastolic parameters were normal.  2. Right ventricular systolic function is normal. The right ventricular size is normal. Tricuspid regurgitation signal is inadequate for assessing PA pressure.  3. The mitral valve is normal in structure. Trivial mitral valve regurgitation. No evidence of mitral stenosis.  4. The aortic valve is tricuspid. Aortic valve regurgitation is not visualized. No aortic stenosis is present.  5. The inferior vena cava is normal in size with greater than 50% respiratory variability, suggesting right atrial pressure of 3 mmHg.  6. Agitated saline contrast bubble study was done. While no clear shunt was seen, bubble study was technically difficult with poor visualization of cardiac chambers. Consider TEE for further evaluation Conclusion(s)/Recommendation(s): No intracardiac source of embolism detected on this transthoracic study. A transesophageal echocardiogram is recommended to exclude cardiac source of embolism if clinically indicated. FINDINGS  Left Ventricle: Left ventricular ejection fraction, by estimation, is 60 to 65%. The left ventricle has normal function. The left ventricle has no regional wall motion abnormalities. The left ventricular internal cavity size was normal in size. There is  mild left ventricular hypertrophy. Left ventricular diastolic parameters were normal. Right Ventricle: The right ventricular size is normal. No increase in right ventricular wall thickness. Right ventricular systolic function is normal. Tricuspid regurgitation signal is inadequate for assessing PA pressure. Left Atrium: Left atrial size was normal in size. Right Atrium: Right atrial size was normal in  size. Pericardium: There is no evidence of pericardial effusion. Mitral Valve: The mitral valve is normal in structure. Trivial mitral valve regurgitation. No evidence of mitral valve stenosis. Tricuspid Valve: The tricuspid valve is normal in structure. Tricuspid valve regurgitation is trivial. Aortic Valve: The aortic valve is tricuspid. Aortic valve regurgitation is not visualized. No aortic stenosis is present. Aortic valve mean gradient measures 4.0 mmHg. Aortic valve peak gradient measures 7.3 mmHg. Aortic valve area, by VTI measures 3.12 cm. Pulmonic Valve: The pulmonic valve was not well visualized. Pulmonic valve regurgitation is not visualized. Aorta: The aortic root and ascending aorta are structurally normal, with no evidence of dilitation. Venous: The inferior vena cava is normal in size with greater than 50% respiratory variability, suggesting right atrial pressure of 3 mmHg. IAS/Shunts: The interatrial septum was not well visualized. Agitated saline contrast was given intravenously to evaluate for intracardiac shunting. Agitated saline contrast bubble study was negative, with no evidence of any interatrial shunt.  LEFT VENTRICLE PLAX 2D LVIDd:         5.10 cm  Diastology LVIDs:         3.10 cm  LV e' medial:    11.00 cm/s LV PW:         1.00 cm  LV E/e' medial:  7.7 LV IVS:        0.80 cm  LV e' lateral:   10.70 cm/s LVOT diam:     2.20 cm  LV E/e' lateral: 7.9 LV SV:         79  LV SV Index:   31 LVOT Area:     3.80 cm  RIGHT VENTRICLE RV Basal diam:  3.40 cm RV S prime:     13.50 cm/s TAPSE (M-mode): 2.2 cm LEFT ATRIUM             Index       RIGHT ATRIUM           Index LA diam:        3.60 cm 1.42 cm/m  RA Area:     16.90 cm LA Vol (A2C):   84.2 ml 33.26 ml/m RA Volume:   44.50 ml  17.58 ml/m LA Vol (A4C):   58.8 ml 23.22 ml/m LA Biplane Vol: 74.0 ml 29.23 ml/m  AORTIC VALVE AV Area (Vmax):    2.96 cm AV Area (Vmean):   2.73 cm AV Area (VTI):     3.12 cm AV Vmax:           135.00 cm/s AV  Vmean:          91.800 cm/s AV VTI:            0.252 m AV Peak Grad:      7.3 mmHg AV Mean Grad:      4.0 mmHg LVOT Vmax:         105.00 cm/s LVOT Vmean:        66.000 cm/s LVOT VTI:          0.207 m LVOT/AV VTI ratio: 0.82  AORTA Ao Root diam: 3.10 cm Ao Asc diam:  2.80 cm MITRAL VALVE MV Area (PHT): 2.99 cm    SHUNTS MV Decel Time: 254 msec    Systemic VTI:  0.21 m MV E velocity: 84.40 cm/s  Systemic Diam: 2.20 cm MV A velocity: 54.80 cm/s MV E/A ratio:  1.54 Epifanio Lesches MD Electronically signed by Epifanio Lesches MD Signature Date/Time: 06/24/2021/5:37:13 PM    Final      Darral Dash, DO 06/25/2021, 8:45 AM PGY-1, [facility] FPTS Intern pager: [phone], text pages welcome

## 2021-06-25 NOTE — Progress Notes (Signed)
Inpatient Rehab Admissions Coordinator:   Per therapy recommendations  patient was screened for CIR candidacy by Hayly Litsey, MS, CCC-SLP. At this time, Pt. Appears to demonstrate medical necessity, functional decline, and ability to tolerate intensity of CIR. Pt. is a potential candidate for CIR. I will request  order for rehab consult per protocol for full assessment. Please contact me any with questions..  Jamicia Haaland, MS, CCC-SLP Rehab Admissions Coordinator  [phone] (celll) [phone] (office)  

## 2021-06-25 NOTE — Evaluation (Signed)
Physical Therapy Evaluation Patient Details Name: Danielle Kelley MRN: [id number] DOB: 08/17/1981 Today's Date: 06/25/2021   History of Present Illness  Pt is 40 yo female who presents with L sided weakness and paresthesias. 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  PMH: gastric bypass sx  Clinical Impression  Pt admitted with above diagnosis. Pt presents with R CVA. 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 She works in [employer] and can work from home if she needs to. She does have an upstairs bedroom and some steps to get into her home. On eval, pt with delayed responses and slow problem solving. LLE 3/5 but very slow. She is able to ambulate with a RW with min A and some R toe drag. Without RW she required use of rail and mod A. Given that pt was completely independent before her CVA, recommend CIR to return to independence.   Pt currently with functional limitations due to the deficits listed below (see PT Problem List). Pt will benefit from skilled PT to increase their independence and safety with mobility to allow discharge to the venue listed below.       Follow Up Recommendations CIR    Equipment Recommendations  Other (comment) (TBD)    Recommendations for Other Services Rehab consult     Precautions / Restrictions Precautions Precautions: Fall Restrictions Weight Bearing Restrictions: No      Mobility  Bed Mobility Overal bed mobility: Needs Assistance Bed Mobility: Supine to Sit     Supine to sit: Supervision     General bed mobility comments: able to sit up from flat bed, no rail, but increased time needed and pt had some difficulty sequencing. Supervision for safety as pt came to EOB    Transfers Overall transfer level: Needs assistance Equipment used: Rolling walker (2 wheeled) Transfers: Sit to/from Stand Sit to Stand: Min assist         General transfer comment: min  A for hand placement and increased time needed. R lean with use of momentum. LUE extended at shoulder and went behind pt when she exerted herself to stand. Min A to steady and assist L hand to RW.  Ambulation/Gait Ambulation/Gait assistance: Min assist;Mod assist Gait Distance (Feet): 40 Feet Assistive device: Rolling walker (2 wheeled);1 person hand held assist Gait Pattern/deviations: Step-to pattern;Decreased dorsiflexion - left;Decreased weight shift to left Gait velocity: decreased Gait velocity interpretation: <1.31 ft/sec, indicative of household ambulator General Gait Details: pt hesitant to step R foot because of having to take full wt on L. Decreased L foot clearance, pt telling herself verbally to pick up her L toes. Increased tension in shoulders and neck. cues for fwd gaze. After 20', RW set aside and pt used R rail and L HHA for 10' with mod A and pt quite insecure. RW given back last 10'  Stairs            Wheelchair Mobility    Modified Rankin (Stroke Patients Only) Modified Rankin (Stroke Patients Only) Pre-Morbid Rankin Score: No symptoms Modified Rankin: Moderately severe disability     Balance Overall balance assessment: Needs assistance Sitting-balance support: Feet supported;No upper extremity supported Sitting balance-Leahy Scale: Good     Standing balance support: No upper extremity supported Standing balance-Leahy Scale: Fair Standing balance comment: able to maintain static stance but maintains R lean  Pertinent Vitals/Pain Pain Assessment: No/denies pain    Home Living Family/patient expects to be discharged to:: Private residence Living Arrangements: Parent;Children Available Help at Discharge: Family;Available 24 hours/day Type of Home: House Home Access: Stairs to enter Entrance Stairs-Rails: Right Entrance Stairs-Number of Steps: 4 Home Layout: Two level Home Equipment: None Additional Comments:  pt's mom who had a CVA, lives with her and is independent with self care but does not drive. 2 teenagers at home as well.    Prior Function Level of Independence: Independent         Comments: works in [employer] for [employer], can work from home     [employer]   Dominant Hand: Right    Extremity/Trunk Assessment   Upper Extremity Assessment Upper Extremity Assessment: Defer to OT evaluation    Lower Extremity Assessment Lower Extremity Assessment: LLE deficits/detail LLE Deficits / Details: hip flex 3-/5, knee ext 3/5, ankle df 3/5, all mvmts very slow and labored LLE Sensation: decreased proprioception;decreased light touch (tingling) LLE Coordination: decreased gross motor    Cervical / Trunk Assessment Cervical / Trunk Assessment: Normal (increased cervical tension with gait)  Communication   Communication: Expressive difficulties (mild)  Cognition Arousal/Alertness: Awake/alert Behavior During Therapy: WFL for tasks assessed/performed Overall Cognitive Status: Impaired/Different from baseline Area of Impairment: Problem solving                             Problem Solving: Difficulty sequencing;Requires verbal cues General Comments: mildly decreased attention to L side. Overthinks commands and slow problem solving and sequencing      General Comments General comments (skin integrity, edema, etc.): discussed stroke sxs as well as risk factors    Exercises     Assessment/Plan    PT Assessment Patient needs continued PT services  PT Problem List Decreased knowledge of use of DME;Decreased safety awareness;Decreased knowledge of precautions;Impaired sensation;Impaired tone;Decreased coordination;Decreased mobility;Decreased balance;Decreased strength;Decreased range of motion;Decreased activity tolerance       PT Treatment Interventions DME instruction;Gait training;Stair training;Functional mobility training;Therapeutic activities;Therapeutic  exercise;Balance training;Neuromuscular re-education;Patient/family education;Cognitive remediation    PT Goals (Current goals can be found in the Care Plan section)  Acute Rehab PT Goals Patient Stated Goal: return to home and work PT Goal Formulation: With patient Time For Goal Achievement: 07/09/21 Potential to Achieve Goals: Good    Frequency Min 4X/week   Barriers to discharge Inaccessible home environment STE home and to get to bedroom    Co-evaluation               AM-PAC PT "6 Clicks" Mobility  Outcome Measure Help needed turning from your back to your side while in a flat bed without using bedrails?: None Help needed moving from lying on your back to sitting on the side of a flat bed without using bedrails?: A Little Help needed moving to and from a bed to a chair (including a wheelchair)?: A Little Help needed standing up from a chair using your arms (e.g., wheelchair or bedside chair)?: A Little Help needed to walk in hospital room?: A Lot Help needed climbing 3-5 steps with a railing? : Total 6 Click Score: 16    End of Session Equipment Utilized During Treatment: Gait belt Activity Tolerance: Patient tolerated treatment well Patient left: Other (comment) (at sink with OT) Nurse Communication: Mobility status PT Visit Diagnosis: Unsteadiness on feet (R26.81);Difficulty in walking, not elsewhere classified (R26.2);Hemiplegia and hemiparesis Hemiplegia - Right/Left: Left Hemiplegia -  dominant/non-dominant: Non-dominant Hemiplegia - caused by: Cerebral infarction    Time: 1010-1045 PT Time Calculation (min) (ACUTE ONLY): 35 min   Charges:   PT Evaluation $PT Eval Moderate Complexity: 1 Mod PT Treatments $Gait Training: 8-22 mins        Lyanne Co, PT  Acute Rehab Services  Pager [phone] Office [phone]   Lawana Chambers Marko Skalski 06/25/2021, 11:33 AM

## 2021-06-25 NOTE — Progress Notes (Signed)
Bilateral lower extremity venous duplex has been completed. Preliminary results can be found in CV Proc through chart review.   06/25/21 1:12 PM Olen Cordial RVT

## 2021-06-26 ENCOUNTER — Encounter (HOSPITAL_COMMUNITY)
Admission: EM | Disposition: A | Discharge status: Rehabilitation Facility | Payer: Self-pay | Source: Home / Self Care | Attending: Family Medicine

## 2021-06-26 ENCOUNTER — Encounter (HOSPITAL_COMMUNITY): Payer: Self-pay | Admitting: Family Medicine

## 2021-06-26 ENCOUNTER — Inpatient Hospital Stay (HOSPITAL_COMMUNITY): Payer: BC Managed Care – PPO

## 2021-06-26 ENCOUNTER — Inpatient Hospital Stay (HOSPITAL_COMMUNITY): Payer: BC Managed Care – PPO | Admitting: Certified Registered"

## 2021-06-26 DIAGNOSIS — D509 Iron deficiency anemia, unspecified: Secondary | ICD-10-CM | POA: Diagnosis not present

## 2021-06-26 DIAGNOSIS — I639 Cerebral infarction, unspecified: Secondary | ICD-10-CM

## 2021-06-26 DIAGNOSIS — I10 Essential (primary) hypertension: Secondary | ICD-10-CM

## 2021-06-26 DIAGNOSIS — J302 Other seasonal allergic rhinitis: Secondary | ICD-10-CM | POA: Diagnosis not present

## 2021-06-26 HISTORY — PX: BUBBLE STUDY: SHX6837

## 2021-06-26 HISTORY — PX: TEE WITHOUT CARDIOVERSION: SHX5443

## 2021-06-26 LAB — BASIC METABOLIC PANEL
Anion gap: 14 (ref 5–15)
BUN: 7 mg/dL (ref 6–20)
CO2: 19 mmol/L — ABNORMAL LOW (ref 22–32)
Calcium: 8.7 mg/dL — ABNORMAL LOW (ref 8.9–10.3)
Chloride: 104 mmol/L (ref 98–111)
Creatinine, Ser: 0.72 mg/dL (ref 0.44–1.00)
GFR, Estimated: >60 mL/min (ref >60)
Glucose, Bld: 85 mg/dL (ref 70–99)
Potassium: 4.5 mmol/L (ref 3.5–5.1)
Sodium: 137 mmol/L (ref 135–145)

## 2021-06-26 LAB — HOMOCYSTEINE: Homocysteine: 8.4 umol/L (ref 0.0–14.5)

## 2021-06-26 LAB — FERRITIN: Ferritin: 8 ng/mL — ABNORMAL LOW (ref 11–307)

## 2021-06-26 SURGERY — ECHOCARDIOGRAM, TRANSESOPHAGEAL
Anesthesia: Monitor Anesthesia Care

## 2021-06-26 MED ORDER — PROPOFOL 500 MG/50ML IV EMUL
INTRAVENOUS | Status: DC | PRN
  Administered 2021-06-26: 175 ug/kg/min via INTRAVENOUS

## 2021-06-26 MED ORDER — PROPOFOL 10 MG/ML IV BOLUS
INTRAVENOUS | Status: DC | PRN
  Administered 2021-06-26 (×2): 20 mg via INTRAVENOUS

## 2021-06-26 MED ORDER — SODIUM CHLORIDE 0.9 % IV SOLN: INTRAVENOUS | Status: DC | PRN

## 2021-06-26 MED ORDER — BUTAMBEN-TETRACAINE-BENZOCAINE 2-2-14 % EX AERO
INHALATION_SPRAY | CUTANEOUS | Status: DC | PRN
  Administered 2021-06-26: 1 via TOPICAL

## 2021-06-26 MED ORDER — SODIUM CHLORIDE 0.9 % IV SOLN: INTRAVENOUS | Status: DC

## 2021-06-26 NOTE — CV Procedure (Signed)
    TRANSESOPHAGEAL ECHOCARDIOGRAM   NAME:  Danielle Kelley   MRN: [id number] DOB:  1980/12/12   ADMIT DATE: 06/24/2021  INDICATIONS: CVA  PROCEDURE:   Informed consent was obtained prior to the procedure. The risks, benefits and alternatives for the procedure were discussed and the patient comprehended these risks.  Risks include, but are not limited to, cough, sore throat, vomiting, nausea, somnolence, esophageal and stomach trauma or perforation, bleeding, low blood pressure, aspiration, pneumonia, infection, trauma to the teeth and death.    Procedural time out performed. The oropharynx was anesthetized with topical 1% cetacaine.    Patient received monitored anesthesia care under the supervision of Dr. Maple Hudson. Patient received a total of 397 mg propofol during the procedure.  The transesophageal probe was inserted in the esophagus and stomach without difficulty and multiple views were obtained.    COMPLICATIONS:    There were no immediate complications.  FINDINGS:  LEFT VENTRICLE: EF = 60-65%. No regional wall motion abnormalities.  RIGHT VENTRICLE: Normal size and function.   LEFT ATRIUM: No thrombus/mass.  LEFT ATRIAL APPENDAGE: No thrombus/mass.   RIGHT ATRIUM: No thrombus/mass.  AORTIC VALVE:  Trileaflet. No regurgitation. No vegetation.  MITRAL VALVE:    Normal structure. Trivial regurgitation. No vegetation.  TRICUSPID VALVE: Normal structure. Trivial regurgitation. No vegetation.  PULMONIC VALVE: Grossly normal structure. Trivial regurgitation. No apparent vegetation.  INTERATRIAL SEPTUM: No PFO or ASD seen by color Doppler. Agitated saline contrast study negative for right to left shunt.  PERICARDIUM: No effusion noted.  DESCENDING AORTA: No significant plaque seen   CONCLUSION: Normal TEE, no intracardiac source of embolism identified. No intra-atrial shunting detected.   Jodelle Red, MD, PhD [facility]  [address]  [address] [place], [place] [address] [phone]   11:02 AM

## 2021-06-26 NOTE — Progress Notes (Signed)
[name]  S Saw patient at bedside this evening. Patient was resting comfortably. No concerns this evening. She had some general questions about her stroke which I answered. She was appreciative of the care she has received.   O: BP 119/79   Pulse 72   Temp 98 F (36.7 C) (Oral)   Resp 18   Ht 5\' 10"  (1.778 m)   Wt (!) 142.9 kg   LMP 06/11/2021 (Exact Date)   SpO2 100%   BMI 45.20 kg/m    General: Alert, no acute distress, pleasant   A/P: Plan per day team  Pending CIR placement  - Orders reviewed. Labs for AM ordered, which was adjusted as needed.   08/11/2021, MD 06/26/2021, 10:43 PM PGY-3, [facility] Night Resident  Please page [phone] with questions.

## 2021-06-26 NOTE — Interval H&P Note (Signed)
History and Physical Interval Note:  06/26/2021 10:26 AM  Danielle Kelley  has presented today for surgery, with the diagnosis of stroke.  The various methods of treatment have been discussed with the patient and family. After consideration of risks, benefits and other options for treatment, the patient has consented to  Procedure(s): TRANSESOPHAGEAL ECHOCARDIOGRAM (TEE) (N/A) as a surgical intervention.  The patient's history has been reviewed, patient examined, no change in status, stable for surgery.  I have reviewed the patient's chart and labs.  Questions were answered to the patient's satisfaction.     Jaclin Finks Cristal Deer

## 2021-06-26 NOTE — Transfer of Care (Signed)
Immediate Anesthesia Transfer of Care Note  Patient: Danielle Kelley  Procedure(s) Performed: TRANSESOPHAGEAL ECHOCARDIOGRAM (TEE) BUBBLE STUDY  Patient Location: Endoscopy Unit  Anesthesia Type:MAC  Level of Consciousness: awake, drowsy and patient cooperative  Airway & Oxygen Therapy: Patient Spontanous Breathing and Patient connected to nasal cannula oxygen  Post-op Assessment: Report given to RN, Post -op Vital signs reviewed and stable and Patient moving all extremities X 4  Post vital signs: Reviewed and stable  Last Vitals:  Vitals Value Taken Time  BP 104/47 06/26/21 1106  Temp    Pulse 90 06/26/21 1107  Resp 6 06/26/21 1107  SpO2 98 % 06/26/21 1107  Vitals shown include unvalidated device data.  Last Pain:  Vitals:   06/26/21 1009  TempSrc: Temporal  PainSc: 0-No pain      Patients Stated Pain Goal: 4 (10/93/23 5573)  Complications: No notable events documented.

## 2021-06-26 NOTE — Progress Notes (Addendum)
STROKE TEAM PROGRESS NOTE   INTERVAL HISTORY No family is at the bedside.  Patient lying in bed, awake alert orientated.  Had TEE this am and showed normal study, no PFO. PT/OT recommend CIR yesterday and pt would like HH or outpt. I have discussed with PT and CIR to see if they can reevaluate her.   OBJECTIVE Vitals:   06/25/21 1640 06/25/21 2051 06/26/21 0046 06/26/21 0348  BP: (!) 135/96 113/80 114/74 127/79  Pulse:  72 76 68  Resp: Temp: 98.6 F (37 C) 97.9 F (36.6 C) 98.1 F (36.7 C) 98.2 F (36.8 C)  TempSrc: Oral Oral  Oral  SpO2: 100% 99% 100% 100%  Weight:      Height:        CBC:  Recent Labs  Lab 06/24/21 0942 06/24/21 1555 06/25/21 0326  WBC 5.8 7.2 5.8  NEUTROABS 3.4  --   --   HGB 12.0 12.2 10.9*  HCT 37.5 37.9 33.9*  MCV 81.5 81.9 80.5  PLT 456* 454* 421*    Basic Metabolic Panel:  Recent Labs  Lab 06/25/21 0326 06/26/21 0318  NA 137 137  K 3.4* 4.5  CL 105 104  CO2 25 19*  GLUCOSE 100* 85  BUN 6 7  CREATININE 0.76 0.72  CALCIUM 8.8* 8.7*    Lipid Panel:     Component Value Date/Time   CHOL 196 06/24/2021 1606   TRIG 148 06/24/2021 1606   HDL 46 06/24/2021 1606   CHOLHDL 4.3 06/24/2021 1606   VLDL 30 06/24/2021 1606   LDLCALC 120 (H) 06/24/2021 1606   HgbA1c:  Lab Results  Component Value Date   HGBA1C 5.9 (H) 06/24/2021   Urine Drug Screen:     Component Value Date/Time   LABOPIA NONE DETECTED 06/25/2021 0539   COCAINSCRNUR NONE DETECTED 06/25/2021 0539   LABBENZ NONE DETECTED 06/25/2021 0539   AMPHETMU NONE DETECTED 06/25/2021 0539   THCU NONE DETECTED 06/25/2021 0539   LABBARB NONE DETECTED 06/25/2021 0539    Alcohol Level No results found for: ETH  IMAGING   CT HEAD WO CONTRAST ( )  Result Date: 06/24/2021 CLINICAL DATA:  Neuro deficit, acute, stroke suspected. Left-sided weakness beginning yesterday. EXAM: CT HEAD WITHOUT CONTRAST TECHNIQUE: Contiguous axial images were obtained from the base of the  skull through the vertex without intravenous contrast. COMPARISON:  None. FINDINGS: Brain: The brain shows a normal appearance without evidence of malformation, atrophy, old or acute small or large vessel infarction, mass lesion, hemorrhage, hydrocephalus or extra-axial collection. Vascular: No hyperdense vessel. No evidence of atherosclerotic calcification. Skull: Normal.  No traumatic finding.  No focal bone lesion. Sinuses/Orbits: Sinuses are clear. Orbits appear normal. Mastoids are clear. Other: None significant IMPRESSION: Normal head CT. Electronically Signed   By: Paulina Fusi M.D.   On: 06/24/2021 10:34   CT ANGIO NECK W OR WO CONTRAST  Result Date: 06/24/2021 CLINICAL DATA:  Stroke/TIA, determine embolic source EXAM: CT ANGIOGRAPHY NECK TECHNIQUE: Multidetector CT imaging of the neck was performed using the standard protocol during bolus administration of intravenous contrast. Multiplanar CT image reconstructions and MIPs were obtained to evaluate the vascular anatomy. Carotid stenosis measurements (when applicable) are obtained utilizing NASCET criteria, using the distal internal carotid diameter as the denominator. CONTRAST:  75mL OMNIPAQUE IOHEXOL 350 MG/ML SOLN COMPARISON:  Same day MR/MRA Head FINDINGS: Aortic arch: Standard branching. Imaged portion shows no evidence of aneurysm or dissection. No significant stenosis of the major arch vessel  origins. Right carotid system: No evidence of dissection, stenosis (50% or greater) or occlusion. Left carotid system: No evidence of dissection, stenosis (50% or greater) or occlusion. The left common carotid artery takes a medialized/retropharyngeal course. Vertebral arteries: Codominant. No evidence of dissection, stenosis (50% or greater) or occlusion. Skeleton: There is no acute osseous abnormality. Other neck: The soft tissues are unremarkable. Upper chest: The lung apices are clear. IMPRESSION: Normal CTA neck. Electronically Signed   By: Lesia HausenPeter  Noone  M.D.   On: 06/24/2021 19:46   MR ANGIO HEAD WO CONTRAST  Result Date: 06/24/2021 CLINICAL DATA:  Demyelinating disease; Neuro deficit, acute, stroke suspected EXAM: MRI HEAD WITHOUT CONTRAST MRA HEAD WITHOUT CONTRAST TECHNIQUE: Multiplanar, multi-echo pulse sequences of the brain and surrounding structures were acquired without intravenous contrast. Angiographic images of the Circle of Willis were acquired using MRA technique without intravenous contrast. COMPARISON:  No pertinent prior exam. FINDINGS: MRI HEAD FINDINGS Brain: Focus of restricted diffusion in the periventricular right posterior frontal corona radiata, immediately superior to and potentially involving the right thalamus. Faint edema without mass effect. No other areas of T2 hyperintensity within the white matter. No hydrocephalus, mass lesion, abnormal mass effect, acute hemorrhage, extra-axial fluid collection. Small dilated perivascular spaces in the inferior basal ganglia bilaterally. Vascular: See below. Skull and upper cervical spine: Normal marrow signal. Sinuses/Orbits: Clear sinuses.  Unremarkable orbits. Other: No mastoid effusions. MRA HEAD FINDINGS Anterior circulation: Bilateral intracranial ICAs, MCAs, and ACAs are patent without proximal hemodynamically significant stenosis. No aneurysm identified. Posterior circulation: The visualized intradural vertebral arteries are patent without significant stenosis. Bilateral PICAs are patent. The basilar artery and bilateral posterior cerebral arteries are patent without proximal hemodynamically significant stenosis. There is a prominent right-sided posterior communicating artery with a small and tortuous right P1 PCA coursing anteriorly and joining the posterior communicating artery, anatomic variant. No aneurysm identified. Anatomic variants: Detailed above. IMPRESSION: MRI: Findings compatible with a small acute infarct in the periventricular right posterior frontal corona radiata,  immediately superior to and potentially involving the right thalamus. Faint edema without mass effect. MRA: No large vessel occlusion or proximal hemodynamically significant stenosis. Electronically Signed   By: Feliberto HartsFrederick S Jones M.D.   On: 06/24/2021 13:56   MR BRAIN WO CONTRAST  Result Date: 06/24/2021 CLINICAL DATA:  Demyelinating disease; Neuro deficit, acute, stroke suspected EXAM: MRI HEAD WITHOUT CONTRAST MRA HEAD WITHOUT CONTRAST TECHNIQUE: Multiplanar, multi-echo pulse sequences of the brain and surrounding structures were acquired without intravenous contrast. Angiographic images of the Circle of Willis were acquired using MRA technique without intravenous contrast. COMPARISON:  No pertinent prior exam. FINDINGS: MRI HEAD FINDINGS Brain: Focus of restricted diffusion in the periventricular right posterior frontal corona radiata, immediately superior to and potentially involving the right thalamus. Faint edema without mass effect. No other areas of T2 hyperintensity within the white matter. No hydrocephalus, mass lesion, abnormal mass effect, acute hemorrhage, extra-axial fluid collection. Small dilated perivascular spaces in the inferior basal ganglia bilaterally. Vascular: See below. Skull and upper cervical spine: Normal marrow signal. Sinuses/Orbits: Clear sinuses.  Unremarkable orbits. Other: No mastoid effusions. MRA HEAD FINDINGS Anterior circulation: Bilateral intracranial ICAs, MCAs, and ACAs are patent without proximal hemodynamically significant stenosis. No aneurysm identified. Posterior circulation: The visualized intradural vertebral arteries are patent without significant stenosis. Bilateral PICAs are patent. The basilar artery and bilateral posterior cerebral arteries are patent without proximal hemodynamically significant stenosis. There is a prominent right-sided posterior communicating artery with a small and tortuous right P1 PCA coursing  anteriorly and joining the posterior  communicating artery, anatomic variant. No aneurysm identified. Anatomic variants: Detailed above. IMPRESSION: MRI: Findings compatible with a small acute infarct in the periventricular right posterior frontal corona radiata, immediately superior to and potentially involving the right thalamus. Faint edema without mass effect. MRA: No large vessel occlusion or proximal hemodynamically significant stenosis. Electronically Signed   By: Feliberto Harts M.D.   On: 06/24/2021 13:56   ECHOCARDIOGRAM COMPLETE BUBBLE STUDY  Result Date: 06/24/2021    ECHOCARDIOGRAM REPORT   Patient Name:   Danielle Kelley Date of Exam: 06/24/2021 Medical Rec #:  [id number]      Height:       70.0 in Accession #:    [id number]     Weight:       315.0 lb Date of Birth:  Mar 03, 1981      BSA:          2.532 m Patient Age:    39 years       BP:           110/95 mmHg Patient Gender: F              HR:           72 bpm. Exam Location:  Inpatient Procedure: 2D Echo, Cardiac Doppler, Color Doppler and Saline Contrast Bubble            Study Indications:    Stroke  History:        Patient has prior history of Echocardiogram examinations. Risk                 Factors:Morbid Obesity.  Sonographer:    Roosvelt Maser RDCS Referring Phys: [id number] [facility] O YADAV IMPRESSIONS  1. Left ventricular ejection fraction, by estimation, is 60 to 65%. The left ventricle has normal function. The left ventricle has no regional wall motion abnormalities. There is mild left ventricular hypertrophy. Left ventricular diastolic parameters were normal.  2. Right ventricular systolic function is normal. The right ventricular size is normal. Tricuspid regurgitation signal is inadequate for assessing PA pressure.  3. The mitral valve is normal in structure. Trivial mitral valve regurgitation. No evidence of mitral stenosis.  4. The aortic valve is tricuspid. Aortic valve regurgitation is not visualized. No aortic stenosis is present.  5. The inferior vena cava is normal in  size with greater than 50% respiratory variability, suggesting right atrial pressure of 3 mmHg.  6. Agitated saline contrast bubble study was done. While no clear shunt was seen, bubble study was technically difficult with poor visualization of cardiac chambers. Consider TEE for further evaluation Conclusion(s)/Recommendation(s): No intracardiac source of embolism detected on this transthoracic study. A transesophageal echocardiogram is recommended to exclude cardiac source of embolism if clinically indicated. FINDINGS  Left Ventricle: Left ventricular ejection fraction, by estimation, is 60 to 65%. The left ventricle has normal function. The left ventricle has no regional wall motion abnormalities. The left ventricular internal cavity size was normal in size. There is  mild left ventricular hypertrophy. Left ventricular diastolic parameters were normal. Right Ventricle: The right ventricular size is normal. No increase in right ventricular wall thickness. Right ventricular systolic function is normal. Tricuspid regurgitation signal is inadequate for assessing PA pressure. Left Atrium: Left atrial size was normal in size. Right Atrium: Right atrial size was normal in size. Pericardium: There is no evidence of pericardial effusion. Mitral Valve: The mitral valve is normal in structure. Trivial mitral valve regurgitation. No evidence of mitral valve  stenosis. Tricuspid Valve: The tricuspid valve is normal in structure. Tricuspid valve regurgitation is trivial. Aortic Valve: The aortic valve is tricuspid. Aortic valve regurgitation is not visualized. No aortic stenosis is present. Aortic valve mean gradient measures 4.0 mmHg. Aortic valve peak gradient measures 7.3 mmHg. Aortic valve area, by VTI measures 3.12 cm. Pulmonic Valve: The pulmonic valve was not well visualized. Pulmonic valve regurgitation is not visualized. Aorta: The aortic root and ascending aorta are structurally normal, with no evidence of dilitation.  Venous: The inferior vena cava is normal in size with greater than 50% respiratory variability, suggesting right atrial pressure of 3 mmHg. IAS/Shunts: The interatrial septum was not well visualized. Agitated saline contrast was given intravenously to evaluate for intracardiac shunting. Agitated saline contrast bubble study was negative, with no evidence of any interatrial shunt.  LEFT VENTRICLE PLAX 2D LVIDd:         5.10 cm  Diastology LVIDs:         3.10 cm  LV e' medial:    11.00 cm/s LV PW:         1.00 cm  LV E/e' medial:  7.7 LV IVS:        0.80 cm  LV e' lateral:   10.70 cm/s LVOT diam:     2.20 cm  LV E/e' lateral: 7.9 LV SV:         79 LV SV Index:   31 LVOT Area:     3.80 cm  RIGHT VENTRICLE RV Basal diam:  3.40 cm RV S prime:     13.50 cm/s TAPSE (M-mode): 2.2 cm LEFT ATRIUM             Index       RIGHT ATRIUM           Index LA diam:        3.60 cm 1.42 cm/m  RA Area:     16.90 cm LA Vol (A2C):   84.2 ml 33.26 ml/m RA Volume:   44.50 ml  17.58 ml/m LA Vol (A4C):   58.8 ml 23.22 ml/m LA Biplane Vol: 74.0 ml 29.23 ml/m  AORTIC VALVE AV Area (Vmax):    2.96 cm AV Area (Vmean):   2.73 cm AV Area (VTI):     3.12 cm AV Vmax:           135.00 cm/s AV Vmean:          91.800 cm/s AV VTI:            0.252 m AV Peak Grad:      7.3 mmHg AV Mean Grad:      4.0 mmHg LVOT Vmax:         105.00 cm/s LVOT Vmean:        66.000 cm/s LVOT VTI:          0.207 m LVOT/AV VTI ratio: 0.82  AORTA Ao Root diam: 3.10 cm Ao Asc diam:  2.80 cm MITRAL VALVE MV Area (PHT): 2.99 cm    SHUNTS MV Decel Time: 254 msec    Systemic VTI:  0.21 m MV E velocity: 84.40 cm/s  Systemic Diam: 2.20 cm MV A velocity: 54.80 cm/s MV E/A ratio:  1.54 Epifanio Lesches MD Electronically signed by Epifanio Lesches MD Signature Date/Time: 06/24/2021/5:37:13 PM    Final    VAS [place] LOWER EXTREMITY VENOUS (DVT)  Result Date: 06/25/2021  Lower Venous DVT Study Patient Name:  ALLEXIS Banh  Date of Exam:   06/25/2021 Medical  Rec #:  [id number]       Accession #:    [id number] Date of Birth: 1980-12-22       Patient Gender: F Patient Age:   82 years Exam Location:  [facility] Procedure:      VAS [place] LOWER EXTREMITY VENOUS (DVT) Referring Phys: Scheryl Marten Alyne Martinson --------------------------------------------------------------------------------  Indications: Stroke.  Risk Factors: None identified. Limitations: Body habitus and poor ultrasound/tissue interface. Comparison Study: No prior studies. Performing Technologist: Chanda Busing RVT  Examination Guidelines: A complete evaluation includes B-mode imaging, spectral Doppler, color Doppler, and power Doppler as needed of all accessible portions of each vessel. Bilateral testing is considered an integral part of a complete examination. Limited examinations for reoccurring indications may be performed as noted. The reflux portion of the exam is performed with the patient in reverse Trendelenburg.  +---------+---------------+---------+-----------+----------+--------------+ RIGHT    CompressibilityPhasicitySpontaneityPropertiesThrombus Aging +---------+---------------+---------+-----------+----------+--------------+ CFV      Full           Yes      Yes                                 +---------+---------------+---------+-----------+----------+--------------+ SFJ      Full                                                        +---------+---------------+---------+-----------+----------+--------------+ FV Prox  Full                                                        +---------+---------------+---------+-----------+----------+--------------+ FV Mid   Full                                                        +---------+---------------+---------+-----------+----------+--------------+ FV DistalFull                                                        +---------+---------------+---------+-----------+----------+--------------+ PFV      Full                                                         +---------+---------------+---------+-----------+----------+--------------+ POP      Full           Yes      Yes                                 +---------+---------------+---------+-----------+----------+--------------+ PTV      Full                                                        +---------+---------------+---------+-----------+----------+--------------+  PERO     Full                                                        +---------+---------------+---------+-----------+----------+--------------+   +---------+---------------+---------+-----------+----------+--------------+ LEFT     CompressibilityPhasicitySpontaneityPropertiesThrombus Aging +---------+---------------+---------+-----------+----------+--------------+ CFV      Full           Yes      Yes                                 +---------+---------------+---------+-----------+----------+--------------+ SFJ      Full                                                        +---------+---------------+---------+-----------+----------+--------------+ FV Prox  Full                                                        +---------+---------------+---------+-----------+----------+--------------+ FV Mid   Full                                                        +---------+---------------+---------+-----------+----------+--------------+ FV DistalFull                                                        +---------+---------------+---------+-----------+----------+--------------+ PFV      Full                                                        +---------+---------------+---------+-----------+----------+--------------+ POP      Full           Yes      Yes                                 +---------+---------------+---------+-----------+----------+--------------+ PTV      Full                                                         +---------+---------------+---------+-----------+----------+--------------+ PERO     Full                                                        +---------+---------------+---------+-----------+----------+--------------+  Summary: RIGHT: - There is no evidence of deep vein thrombosis in the lower extremity.  - No cystic structure found in the popliteal fossa.  LEFT: - There is no evidence of deep vein thrombosis in the lower extremity.  - No cystic structure found in the popliteal fossa.  *See table(s) above for measurements and observations. Electronically signed by Waverly Ferrari MD on 06/25/2021 at 2:50:34 PM.    Final     LE venous Doppler - no DVT  ECG - SR rate 71 BPM. (See cardiology reading for complete details)  PHYSICAL EXAM  Temp:  [97.9 F (36.6 C)-98.6 F (37 C)] 98.2 F (36.8 C) (08/24 0348) Pulse Rate:  [66-76] 68 (08/24 0348) Resp:  [14-18] 15 (08/24 0348) BP: (113-135)/(74-96) 127/79 (08/24 0348) SpO2:  [99 %-100 %] 100 % (08/24 0348)  General - Well nourished, well developed, in no apparent distress.  Ophthalmologic - fundi not visualized due to noncooperation.  Cardiovascular - Regular rhythm and rate.  Mental Status -  Level of arousal and orientation to time, place, and person were intact. Language including expression, naming, repetition, comprehension was assessed and found intact. Attention span and concentration were normal. Fund of Knowledge was assessed and was intact.  Cranial Nerves II - XII - II - Visual field intact OU. III, IV, VI - Extraocular movements intact. V - Facial sensation intact bilaterally. VII - Facial movement intact bilaterally. VIII - Hearing & vestibular intact bilaterally. X - Palate elevates symmetrically. XI - Chin turning & shoulder shrug intact bilaterally. XII - Tongue protrusion intact.  Motor Strength - The patient's strength was normal in right UE and LE, but 4/5 LUE and LLE with mild giveaway weakness.  Bulk was normal and fasciculations were absent.   Motor Tone - Muscle tone was assessed at the neck and appendages and was normal.  Reflexes - The patient's reflexes were symmetrical in all extremities and she had no pathological reflexes.  Sensory - Light touch, temperature/pinprick were assessed and were symmetrical.    Coordination - The patient had normal movements in the hands with no ataxia or dysmetria.  Tremor was absent.  Gait and Station - deferred.   ASSESSMENT/PLAN Ms. Lashaya Kienitz is a 40 y.o. female with no significant past medical history who presented with left upper and lower extremity weakness and paresthesias. She did not receive IV t-PA due to late presentation (>4.5 hours from time of onset).  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 MRA head - no significant stenosis CTA Neck - normal 2D Echo - EF 60 - 65%. No cardiac source of emboli identified.  LE venous Doppler no DVT TEE normal study, no PFO LDL - 120 HgbA1c - 5.9 UDS - negative VTE prophylaxis - Lovenox No antithrombotic prior to admission, now on aspirin 81 mg daily and Plavix 75 DAPT for 3 weeks and then aspirin alone. Patient will be counseled to be compliant with her antithrombotic medications Ongoing aggressive stroke risk factor management Therapy recommendations:  CIR - reeval today Disposition:  Pending  Hypertension Home BP meds: none  Current BP 130s-160s Long-term BP goal normotensive  Hyperlipidemia Home Lipid lowering medication: none  LDL 120, goal < 70 Current lipid lowering medication: Lipitor 40  mg daily  Continue statin at discharge  Other Stroke Risk  Factors Morbid obesity, Body mass index is 45.2 kg/m., recommend weight loss, diet and exercise as appropriate  Pt admitted that she is not eating healthy  Other Active Problems, Findings, Recommendations and/or Plan Code status - Full code Mild hypokalemia, K 3.4->4.5   Hospital day # 1  Neurology will sign off. Please call with questions. Pt will follow up with stroke clinic Dr. Pearlean Brownie at [facility] in about 4 weeks. Thanks for the consult.  Marvel Plan, MD PhD Stroke Neurology 06/26/2021 2:40 PM    To contact Stroke Continuity provider, please refer to [email or web address]. After hours, contact General Neurology

## 2021-06-26 NOTE — Progress Notes (Signed)
Physical Therapy Treatment Patient Details Name: Danielle Kelley MRN: [id number] DOB: [birth year] Today's Date: 06/26/2021    History of Present Illness Pt is 40 yo female who presents with L sided weakness and paresthesias on 06/24/21. 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  PMH: gastric bypass sx    PT Comments    Pt motivated and making good progress.  Expressing that she would love to be able to go home at d/c but understands the benefits of rehab for her long term outcomes.  She is trying to use her L side as able. She was able to increase gait and is demonstrating improved L LE strength, but still with slow movements and decreased coordination.  Cues for exercise technique including full ROM.  Continues to needs assist for transfers/gait - continue to strongly recommend CIR , pt with excellent rehab potential.    Follow Up Recommendations  CIR     Equipment Recommendations  Rolling walker with 5" wheels    Recommendations for Other Services Rehab consult     Precautions / Restrictions Precautions Precautions: Fall    Mobility  Bed Mobility Overal bed mobility: Needs Assistance Bed Mobility: Supine to Sit;Sit to Supine     Supine to sit: Supervision Sit to supine: Supervision   General bed mobility comments: Requiring increased time for L side, also having to self assist L leg back to bed    Transfers Overall transfer level: Needs assistance Equipment used: Rolling walker (2 wheeled) Transfers: Sit to/from Stand Sit to Stand: Min assist         General transfer comment: Performed x 4 for transfers (further for exercise - see ther ex).  Good carry over with hand placement.  Increased time to rise with min A to steady at times.  Ambulation/Gait Ambulation/Gait assistance: Min assist Gait Distance (Feet): 80 Feet (80' then 5') Assistive device: Rolling walker (2 wheeled);None Gait Pattern/deviations: Decreased  dorsiflexion - left;Decreased weight shift to left;Step-through pattern Gait velocity: decreased   General Gait Details: Pt with slow gait pattern but able to progress to step through gait.  Ambulated 38' in hallway with RW then wanted to attempt ambulation from sink to bed without AD (5').  With ambulation demonstrating decreased dorsiflexion and increased supination - able to mostly correct with verbal cues -pt also aware and self correcting at times.  Also, with mild instability at knee with fatigue and without RW.  REcommended continuing RW for now.   Stairs             Wheelchair Mobility    Modified Rankin (Stroke Patients Only) Modified Rankin (Stroke Patients Only) Pre-Morbid Rankin Score: No symptoms Modified Rankin: Moderately severe disability     Balance Overall balance assessment: Needs assistance Sitting-balance support: Feet supported;No upper extremity supported Sitting balance-Leahy Scale: Good     Standing balance support: No upper extremity supported Standing balance-Leahy Scale: Fair Standing balance comment: Able to maintain and even take a couple steps without support, but improved safety with UE support                            Cognition Arousal/Alertness: Awake/alert Behavior During Therapy: WFL for tasks assessed/performed Overall Cognitive Status: Within Functional Limits for tasks assessed  Exercises General Exercises - Lower Extremity Ankle Circles/Pumps: AROM;Both;20 reps;Supine (cues for full ROM) Quad Sets: AROM;Both;Supine;20 reps Other Exercises Other Exercises: Sit to stand x 5 with L leg favored (R leg forward): at sink with UE support, increased time and effort Other Exercises: Heel raises x 10 and mini squats at sink with UE support    General Comments General comments (skin integrity, edema, etc.): Educated on neuroplasticity and stroke recovery. Pt doing well  trying to use L UE and LE even though it is slower and hard. Also, educated on exercises to improve coordination and speed inculding ankle pumps and quad sets (alternating R/L and coordinating R/L).  Encouraged to focus on full ROM with ankle      Pertinent Vitals/Pain Pain Assessment: No/denies pain    Home Living                      Prior Function            PT Goals (current goals can now be found in the care plan section) Acute Rehab PT Goals Patient Stated Goal: return to home and work; would love to be home by kid's first day of highschool (Monday), but also reports parents day at college that she hopes to attend in Sept PT Goal Formulation: With patient Time For Goal Achievement: 07/09/21 Potential to Achieve Goals: Good Progress towards PT goals: Progressing toward goals    Frequency    Min 4X/week      PT Plan Current plan remains appropriate    Co-evaluation              AM-PAC PT "6 Clicks" Mobility   Outcome Measure  Help needed turning from your back to your side while in a flat bed without using bedrails?: None Help needed moving from lying on your back to sitting on the side of a flat bed without using bedrails?: A Little Help needed moving to and from a bed to a chair (including a wheelchair)?: A Little Help needed standing up from a chair using your arms (e.g., wheelchair or bedside chair)?: A Little Help needed to walk in hospital room?: A Little Help needed climbing 3-5 steps with a railing? : A Lot 6 Click Score: 18    End of Session Equipment Utilized During Treatment: Gait belt Activity Tolerance: Patient tolerated treatment well Patient left: in bed;with call bell/phone within reach;with bed alarm set Nurse Communication: Mobility status PT Visit Diagnosis: Unsteadiness on feet (R26.81);Difficulty in walking, not elsewhere classified (R26.2);Hemiplegia and hemiparesis Hemiplegia - Right/Left: Left Hemiplegia -  dominant/non-dominant: Non-dominant Hemiplegia - caused by: Cerebral infarction     Time: 6270-3500 PT Time Calculation (min) (ACUTE ONLY): 40 min  Charges:  $Gait Training: 8-22 mins $Therapeutic Exercise: 8-22 mins $Neuromuscular Re-education: 8-22 mins                     Anise Salvo, PT Acute Rehab Services Pager [phone] [facility] Rehab [phone]    Danielle Kelley 06/26/2021, 4:34 PM

## 2021-06-26 NOTE — Progress Notes (Addendum)
Family Medicine Teaching Service Daily Progress Note Intern Pager: [phone]  Patient name: Danielle Kelley Medical record number: [id number] Date of birth: 1980-11-22 Age: 40 y.o. Gender: female  Primary Care Provider: Virgilio Belling, PA-C Consultants: Neurology Code Status: Full  Pt Overview and Major Events to Date:  8/22-admitted, brain MRA/MRI-small acute infarct in the periventricular right posterior frontal corona radiata; no large vessel occlusion  Assessment and Plan: Danielle Kelley is a 40 year old female admitted with left-sided weakness and paresthesias, found to have acute CVA.  Past medical history significant for obesity s/p gastric bypass.  Acute CVA TEE normal study, no PFO. Continued left upper and lower extremity weakness, but improved. Fine motor function in left hand improved. Mild headache this morning, also improved.  No new neurologic deficits.  Awaiting bed for CIR- pending insurance authorization.  Medically stable for discharge to CIR. -Vital signs per floor -Continue cardiac monitoring -Fall precautions -PT OT -ASA 81 mg daily and Plavix 75 DAPT for 3 weeks, then aspirin alone, per neuro recs -Neurology signed off, appreciate recommendations  Constipation No BM since admission. No nausea, vomiting or abdominal pain. - Miralax BID - Senna BID  HLD LDL 120, goal less than 70 -Continue Lipitor 40 mg daily  Normocytic anemia Hgb 11.5 this morning, increased from 10.9 yesterday.  Likely iron deficient, as ferritin level was low at 8.  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  No pallor, weakness, dizziness, tachycardia, shortness of breath. -Ferrous sulfate 325 mg  Seasonal allergies Chronic, stable -Continue Claritin 10 mg daily  FEN/GI: Regular PPx: Lovenox Dispo:CIR pending placement. Barriers include CIR availability.   Subjective:  [name] feels well this morning. She was  conversational and told me that she has no complaints, showed me pictures of her daughter from move-in day yesterday to her college. Has been continuing to work on typing on her computer to improve motor function in left hand- accuracy is improving. Still has a mild headache. No bowel movement yet, and does not want any laxatives or stool softeners as she says that it is due to bathroom "shyness" but she will try to have a BM today since this might be necessary before going to CIR. She was very thankful for the care she has   Objective: Temp:  [97.3 F (36.3 C)-98.7 F (37.1 C)] 97.6 F (36.4 C) (08/24 1227) Pulse Rate:  [66-91] 66 (08/24 1227) Resp:  [12-20] 14 (08/24 1227) BP: (104-146)/(47-96) 116/72 (08/24 1227) SpO2:  [98 %-100 %] 100 % (08/24 1227) Physical Exam: General: Well-appearing, pleasant, conversational, no acute distress, resting in bed Cardiovascular: Regular rate and rhythm, no murmurs appreciated Respiratory: Good aeration in all lung fields, no wheezing or crackles Abdomen: Soft, non-distended, non-tender Extremities: Clear and fluent speech. No aphasia. Awake, alert and oriented to person, place, time. EOMI without ptsosis. Facial muscle strength normal, no asymmetry. Normal voice tone and volume. Continued, but improved, decreased left hand grip strength compared to right. Diminished 3/5 strength in upper left extremity, 5/5 strength in upper right extremity. Wiggles toes bilaterally. 4/5 left lower leg strength, 5/5 right lower leg strength.  Laboratory: Recent Labs  Lab 06/24/21 0942 06/24/21 1555 06/25/21 0326  WBC 5.8 7.2 5.8  HGB 12.0 12.2 10.9*  HCT 37.5 37.9 33.9*  PLT 456* 454* 421*   Recent Labs  Lab 06/24/21 0942 06/25/21 0326 06/26/21 0318  NA 137 137 137  K 3.9 3.4* 4.5  CL 106 105  104  CO2 24 25 19*  BUN 6 6 7   CREATININE 0.76 0.76 0.72  CALCIUM 8.9 8.8* 8.7*  PROT 7.3 6.5  --   BILITOT 0.4 0.5  --   ALKPHOS 61 57  --   ALT 21 21  --    AST 19 18  --   GLUCOSE 95 100* 85    Iron/TIBC/Ferritin/ %Sat    Component Value Date/Time   FERRITIN 8 (L) 06/26/2021 1346     Imaging/Diagnostic Tests: No results found.   06/28/2021, DO 06/26/2021, 3:03 PM PGY-1, [facility] Family Medicine FPTS Intern pager: [phone], text pages welcome

## 2021-06-26 NOTE — Progress Notes (Signed)
  Echocardiogram Echocardiogram Transesophageal has been performed.  Leta Jungling M 06/26/2021, 11:29 AM

## 2021-06-26 NOTE — Progress Notes (Addendum)
Family Medicine Teaching Service Daily Progress Note Intern Pager: [phone]  Patient name: Danielle Kelley Medical record number: [id number] Date of birth: Nov 26, 1980 Age: 40 y.o. Gender: female  Primary Care Provider: Virgilio Belling, PA-C Consultants: Neurology Code Status: Full  Pt Overview and Major Events to Date:  8/22- Admitted, Brain MRA/MRI- small acute infarct in perventricular right posterior frontal corona radiata; no large vessel occlusion  Assessment and Plan: Danielle Kelley is a 40 y.o. female admitted with left sided weakness and paresthesias, found to have acute CVA. PMH significant for obesity s/p gastric bypass.  Acute CVA Continued left upper and lower extremity weakness, with grip strength slightly improved.  She is working on finger/hand and feet exercises in the bed.  No new neurologic defects. SLP evaluated patient yesterday with no follow-up indicated. PT and OT recommended CIR for decline in current level of function. NPO last night at midnight for TEE this morning. Major risk factor remains only morbid obesity (BMI 45.2) and will recommend weight loss, diet, and exercise as appropriate.  Patient disposition to CIR, which I explained at length.  I consulted CIR yesterday and patient is pending bed availability.  She is medically stable. - Vital signs per floor - Cardiac monitoring - Follow-up on TEE results -Tylenol 650 mg every 4 hours as needed for pain - Fall precautioins - PT/OT  - ASA 81mg  daily and Plavix 75 DAPT for 3 weeks, then ASA alone, per neuro - Neurology following, appreciate recommendations - Pending neurology workup (cardiolipin ab, ANA, homocysteine, lupus anticoagulant.) - CIR consulted, awaiting placement  HLD LDL 120, goal <70 - Continue Lipitor 40mg  daily  HTN BP 146/89.  Overnight ranges 110s-120s/70s- 90s -Monitor vital signs  Normocytic anemia Hgb 10.9. Asymptomatic. LMP 06/11/2021.  She does not have a history of anemia to her  knowledge.  No pallor, weakness, dizziness. Given low B12 level and administration of daily B12, in addition to history of gastric bypass, she could have a microcytic/macrocytic mixed anemia. Will check Ferritin level - Ferritin  Seasonal allergies Chronic, stable -Continue Claritin 10 mg daily  FEN/GI: Regular PPx: Lovenox Dispo:CIR  pending placement . Barriers include CIR availability.   Subjective:  Patient well-appearing this morning.  She was laying in bed on her laptop doing hand exercises and tells me she has been working on flexing her feet to improve her gait and prevent foot drop.  She has continued numbness on the lateral side of her left lower extremity.  No nausea, vomiting, diarrhea.  She does have a headache similar to yesterday behind her eyes with photophobia.  She is unsure if she wants to have another child, is not currently on contraception and we discussed various options including IUD and Implanon and that she should avoid oral contraceptives given her acute CVA.  She is upset that she is missing her daughter is moving day to college in today.  She says that she was tearful and crying this morning and was wondering if that was a side effect of the CVA.  She is yet to have a bowel movement, which she says is mostly due to the fact that she has bathroom "shyness" when not at home.  Objective: Temp:  [97.9 F (36.6 C)-98.6 F (37 C)] 98.2 F (36.8 C) (08/24 0348) Pulse Rate:  [66-76] 68 (08/24 0348) Resp:  [14-18] 15 (08/24 0348) BP: (113-135)/(74-96) 127/79 (08/24 0348) SpO2:  [99 %-100 %] 100 % (08/24 0348) Physical Exam: General: Well-appearing, pleasant, conversational, no acute distress,  resting in bed Cardiovascular: Regular rate and rhythm, no murmurs appreciated,  Respiratory: Good aeration in all lung fields, no wheezing or crackles Abdomen: Soft, non-tender, non-distended Extremities: Clear and fluent speech. No aphasia. Awake, alert and oriented  to person, place, time. Normal visual fields in all quadrants. EOMI without ptosis. Facial muscle strength normal and intact bilaterally. Voice is normal tone and volume. Diminished 3/5 strength in upper left extremity, 5/5 strength in upper right extremity. Wiggles toes bilaterally. 3/5 left lower leg strength, 5/5 right lower leg strength.  Weakened grip strength on left side.  Laboratory: Recent Labs  Lab 06/24/21 0942 06/24/21 1555 06/25/21 0326  WBC 5.8 7.2 5.8  HGB 12.0 12.2 10.9*  HCT 37.5 37.9 33.9*  PLT 456* 454* 421*   Recent Labs  Lab 06/24/21 0942 06/25/21 0326 06/26/21 0318  NA 137 137 137  K 3.9 3.4* 4.5  CL 106 105 104  CO2 24 25 19*  BUN 6 6 7   CREATININE 0.76 0.76 0.72  CALCIUM 8.9 8.8* 8.7*  PROT 7.3 6.5  --   BILITOT 0.4 0.5  --   ALKPHOS 61 57  --   ALT 21 21  --   AST 19 18  --   GLUCOSE 95 100* 85    Imaging/Diagnostic Tests: VAS LOWER EXTREMITY VENOUS (DVT)  Result Date: 06/25/2021  Lower Venous DVT Study Patient Name:  [name]  Date of Exam:   06/25/2021 Medical Rec #: 06/27/2021       Accession #:    [id number] Date of Birth: 1981/06/21       Patient Gender: F Patient Age:   71 years Exam Location:  [facility] Procedure:      VAS [facility] LOWER EXTREMITY VENOUS (DVT) Referring Phys: [place] XU --------------------------------------------------------------------------------  Indications: Stroke.  Risk Factors: None identified. Limitations: Body habitus and poor ultrasound/tissue interface. Comparison Study: No prior studies. Performing Technologist: Scheryl Marten RVT  Examination Guidelines: A complete evaluation includes B-mode imaging, spectral Doppler, color Doppler, and power Doppler as needed of all accessible portions of each vessel. Bilateral testing is considered an integral part of a complete examination. Limited examinations for reoccurring indications may be performed as noted. The reflux portion of the exam is performed with the  patient in reverse Trendelenburg.  +---------+---------------+---------+-----------+----------+--------------+ RIGHT    CompressibilityPhasicitySpontaneityPropertiesThrombus Aging +---------+---------------+---------+-----------+----------+--------------+ CFV      Full           Yes      Yes                                 +---------+---------------+---------+-----------+----------+--------------+ SFJ      Full                                                        +---------+---------------+---------+-----------+----------+--------------+ FV Prox  Full                                                        +---------+---------------+---------+-----------+----------+--------------+ FV Mid   Full                                                        +---------+---------------+---------+-----------+----------+--------------+  FV DistalFull                                                        +---------+---------------+---------+-----------+----------+--------------+ PFV      Full                                                        +---------+---------------+---------+-----------+----------+--------------+ POP      Full           Yes      Yes                                 +---------+---------------+---------+-----------+----------+--------------+ PTV      Full                                                        +---------+---------------+---------+-----------+----------+--------------+ PERO     Full                                                        +---------+---------------+---------+-----------+----------+--------------+   +---------+---------------+---------+-----------+----------+--------------+ LEFT     CompressibilityPhasicitySpontaneityPropertiesThrombus Aging +---------+---------------+---------+-----------+----------+--------------+ CFV      Full           Yes      Yes                                  +---------+---------------+---------+-----------+----------+--------------+ SFJ      Full                                                        +---------+---------------+---------+-----------+----------+--------------+ FV Prox  Full                                                        +---------+---------------+---------+-----------+----------+--------------+ FV Mid   Full                                                        +---------+---------------+---------+-----------+----------+--------------+ FV DistalFull                                                        +---------+---------------+---------+-----------+----------+--------------+  PFV      Full                                                        +---------+---------------+---------+-----------+----------+--------------+ POP      Full           Yes      Yes                                 +---------+---------------+---------+-----------+----------+--------------+ PTV      Full                                                        +---------+---------------+---------+-----------+----------+--------------+ PERO     Full                                                        +---------+---------------+---------+-----------+----------+--------------+     Summary: RIGHT: - There is no evidence of deep vein thrombosis in the lower extremity.  - No cystic structure found in the popliteal fossa.  LEFT: - There is no evidence of deep vein thrombosis in the lower extremity.  - No cystic structure found in the popliteal fossa.  *See table(s) above for measurements and observations. Electronically signed by Waverly Ferrari MD on 06/25/2021 at 2:50:34 PM.    Final      Darral Dash, DO 06/26/2021, 7:07 AM PGY-1, [facility] Family Medicine FPTS Intern pager: [phone], text pages welcome

## 2021-06-26 NOTE — Progress Notes (Signed)
Inpatient Rehab Admissions Coordinator:   Consult received. I met with patient at bedside to discuss CIR versus home.  Pt states that she would love to go home but understands that rehab may be the better option.  We discussed the pros/cons of each, and pt would like to start insurance auth for CIR.  We discussed that if she continues to progress while awaiting a determination she may not need CIR.  We also discussed if she did admit to CIR, would likely be a 7-10 day stay, with mod I goals.  Her main goal is to go see her daughter for parents weekend at [employer] at the end of September.    Caitlin Warren, PT, DPT Admissions Coordinator [phone] 06/26/21  3:52 PM  

## 2021-06-27 ENCOUNTER — Encounter (HOSPITAL_COMMUNITY): Payer: Self-pay | Admitting: Family Medicine

## 2021-06-27 DIAGNOSIS — D509 Iron deficiency anemia, unspecified: Secondary | ICD-10-CM | POA: Diagnosis not present

## 2021-06-27 DIAGNOSIS — J302 Other seasonal allergic rhinitis: Secondary | ICD-10-CM | POA: Diagnosis not present

## 2021-06-27 DIAGNOSIS — I639 Cerebral infarction, unspecified: Secondary | ICD-10-CM | POA: Diagnosis not present

## 2021-06-27 LAB — CBC WITH DIFFERENTIAL/PLATELET
Abs Immature Granulocytes: 0.01 10*3/uL (ref 0.00–0.07)
Basophils Absolute: 0 10*3/uL (ref 0.0–0.1)
Basophils Relative: 0 %
Eosinophils Absolute: 0.1 10*3/uL (ref 0.0–0.5)
Eosinophils Relative: 2 %
HCT: 35.5 % — ABNORMAL LOW (ref 36.0–46.0)
Hemoglobin: 11.5 g/dL — ABNORMAL LOW (ref 12.0–15.0)
Immature Granulocytes: 0 %
Lymphocytes Relative: 39 %
Lymphs Abs: 2.5 10*3/uL (ref 0.7–4.0)
MCH: 26 pg (ref 26.0–34.0)
MCHC: 32.4 g/dL (ref 30.0–36.0)
MCV: 80.3 fL (ref 80.0–100.0)
Monocytes Absolute: 0.4 10*3/uL (ref 0.1–1.0)
Monocytes Relative: 6 %
Neutro Abs: 3.5 10*3/uL (ref 1.7–7.7)
Neutrophils Relative %: 53 %
Platelets: 441 10*3/uL — ABNORMAL HIGH (ref 150–400)
RBC: 4.42 MIL/uL (ref 3.87–5.11)
RDW: 13.6 % (ref 11.5–15.5)
WBC: 6.5 10*3/uL (ref 4.0–10.5)
nRBC: 0 % (ref 0.0–0.2)

## 2021-06-27 LAB — BETA-2-GLYCOPROTEIN I ABS, IGG/M/A
Beta-2 Glyco I IgG: <9 GPI IgG units (ref 0–20)
Beta-2-Glycoprotein I IgA: <9 GPI IgA units (ref 0–25)
Beta-2-Glycoprotein I IgM: <9 GPI IgM units (ref 0–32)

## 2021-06-27 LAB — CARDIOLIPIN ANTIBODIES, IGG, IGM, IGA
Anticardiolipin IgA: <9 APL U/mL (ref 0–11)
Anticardiolipin IgG: <9 GPL U/mL (ref 0–14)
Anticardiolipin IgM: <9 MPL U/mL (ref 0–12)

## 2021-06-27 MED ORDER — SENNOSIDES-DOCUSATE SODIUM 8.6-50 MG PO TABS
1.0000 | ORAL_TABLET | Freq: Two times a day (BID) | ORAL | Status: DC
Start: 2021-06-27 — End: 2021-06-28
  Administered 2021-06-27 – 2021-06-28 (×3): 1 via ORAL
  Filled 2021-06-27 (×3): qty 1

## 2021-06-27 MED ORDER — FERROUS SULFATE 325 (65 FE) MG PO TABS
325.0000 mg | ORAL_TABLET | Freq: Every day | ORAL | Status: DC
  Administered 2021-06-27 – 2021-06-28 (×2): 325 mg via ORAL
  Filled 2021-06-27 (×2): qty 1

## 2021-06-27 MED ORDER — POLYETHYLENE GLYCOL 3350 17 G PO PACK
17.0000 g | PACK | Freq: Every day | ORAL | Status: DC
  Administered 2021-06-28: 17 g via ORAL
  Filled 2021-06-27 (×2): qty 1

## 2021-06-27 NOTE — Progress Notes (Signed)
Inpatient Rehab Admissions Coordinator:   Awaiting determination from [facility] [place] regarding prior auth request for CIR. Will continue to follow.   Estill Dooms, PT, DPT Admissions Coordinator [phone] 06/27/21  12:53 PM

## 2021-06-27 NOTE — Progress Notes (Signed)
[name]  S Saw patient at bedside this evening. Patient was laying in bed comfortably. Reports some left sided muscle spasms maybe from doing PT today. She also reports one of her 2 children were not able to come and see her this week when they got an uber to see her. One of them was refused entry to the ward.  O: BP 127/74 (BP Location: Right Arm)   Pulse 78   Temp 98.3 F (36.8 C) (Oral)   Resp 19   Ht 5\' 10"  (1.778 m)   Wt (!) 142.9 kg   LMP 06/11/2021 (Exact Date)   SpO2 100%   BMI 45.20 kg/m    General: Alert, no acute distress, pleasant   A/P: Plan per day team  -Likely d/c to CIR tomorrow  - Orders reviewed. Labs for AM not ordered, which was adjusted as needed.   Discussed with charge RN 08/11/2021 regarding visitation policy. I explained that her 2 children ages 68 and 93 should be able to visit Ms Blaylock. We can place a nursing order order if needed. He will speak to the charge RN in the morning.  12, MD 06/27/2021, 8:13 PM PGY-3, [place] [facility] Night Resident  Please page [phone] with questions.

## 2021-06-27 NOTE — H&P (Signed)
Physical Medicine and Rehabilitation Admission H&P    Chief Complaint  Patient presents with   Weakness    HPI: Danielle Kelley is a 40 year old female with history of morbid obesity s/p sleein relatively good health who was admitted on 06/24/21 with reports of left sided numbness with weakness, heaviness and difficulty walking. UDS  negative. MRI brain done revealing small acute infarct in right posterior frontal corona radiata and MRA negative for LVO/stenosis. TEE done revealing EF 60-65% with no wall abnormality and negative for shunting, PFO or thrombus. Dr. Justice Britain that stroke likely due to small vessel disease but atypical and recommended DAPT X 3 weeks followed by ASA alone. Coagulopathy panel pending. She continues to be limited by L-sided weakness with numbness, left knee instability with fatigue and balance deficits. CIR recommended due to functional decline.    Review of Systems  Constitutional:  Negative for chills and fever.  HENT:  Negative for hearing loss and tinnitus.   Eyes:  Negative for blurred vision and double vision.  Respiratory:  Negative for cough and hemoptysis.   Cardiovascular:  Negative for chest pain and palpitations.  Gastrointestinal:  Positive for constipation. Negative for nausea and vomiting.  Genitourinary:  Negative for dysuria.  Musculoskeletal:  Negative for myalgias.  Skin:  Negative for rash.  Neurological:  Positive for tingling, sensory change and focal weakness. Negative for headaches.  Psychiatric/Behavioral:  Negative for depression and suicidal ideas.     Past Medical History:  Diagnosis Date   Obesity    Seasonal allergies      Past Surgical History:  Procedure Laterality Date   ABDOMINAL SURGERY  04/06/2017   Gastric sleeve   CESAREAN SECTION     2004, 2006, 2007.    Family History  Problem Relation Age of Onset   Stroke Mother    Hypertension Mother    Hypertension Maternal Grandmother    Hypertension Maternal  Grandfather     Social History: Moved to [facility] 2021 for work--works in HR for  Her mother and children live with her. She  has no history on file for tobacco use, alcohol use, and drug use.   Allergies: No Known Allergies   Medications Prior to Admission  Medication Sig Dispense Refill   cetirizine (ZYRTEC) 10 MG tablet Take 10 mg by mouth daily as needed for allergies.      Drug Regimen Review  Drug regimen was reviewed and remains appropriate with no significant issues identified  Home: Home Living Family/patient expects to be discharged to:: Private residence Living Arrangements: Parent, Children Available Help at Discharge: Family, Available 24 hours/day Type of Home: House Home Access: Stairs to enter [employer] of Steps: 4 Entrance Stairs-Rails: Right Home Layout: Two level Alternate Level Stairs-Number of Steps: flight Alternate Level Stairs-Rails: Right Home Equipment: None Additional Comments: pt's mom who had a CVA, lives with her and is independent with self care but does not drive. 2 teenagers at home as well.  Lives With: Other (Comment) (mother lives with her and her children live with her)   Functional History: Prior Function Level of Independence: Independent Comments: works in [employer] for [employer], can work from home  Functional Status:  Mobility: Bed Mobility Overal bed mobility: Needs Assistance Bed Mobility: Supine to Sit, Sit to Supine Supine to sit: Supervision Sit to supine: Supervision General bed mobility comments: Requiring increased time for L side, also having to self assist L leg back to bed Transfers Overall transfer level: Needs  assistance Equipment used: Rolling walker (2 wheeled) Transfers: Sit to/from Stand Sit to Stand: Min assist General transfer comment: Performed x 4 for transfers (further for exercise - see ther ex).  Good carry over with hand placement.  Increased time to rise with min A to steady at  times. Ambulation/Gait Ambulation/Gait assistance: Min assist Gait Distance (Feet): 80 Feet (80' then 5') Assistive device: Rolling walker (2 wheeled), None Gait Pattern/deviations: Decreased dorsiflexion - left, Decreased weight shift to left, Step-through pattern General Gait Details: Pt with slow gait pattern but able to progress to step through gait.  Ambulated 63' in hallway with RW then wanted to attempt ambulation from sink to bed without AD (5').  With ambulation demonstrating decreased dorsiflexion and increased supination - able to mostly correct with verbal cues -pt also aware and self correcting at times.  Also, with mild instability at knee with fatigue and without RW.  REcommended continuing RW for now. Gait velocity: decreased Gait velocity interpretation: <1.31 ft/sec, indicative of household ambulator    ADL: ADL Overall ADL's : Needs assistance/impaired Eating/Feeding: Modified independent, Sitting Grooming: Wash/dry hands, Oral care, Minimal assistance, Standing Grooming Details (indicate cue type and reason): at sink level Upper Body Bathing: Set up, Sitting Lower Body Bathing: Min guard, Sit to/from stand Upper Body Dressing : Set up, Sitting Lower Body Dressing: Minimal assistance, Sit to/from stand Lower Body Dressing Details (indicate cue type and reason): pt re Toilet Transfer: Minimal assistance, Ambulation, RW Toileting- Clothing Manipulation and Hygiene: Minimal assistance, Sit to/from stand Functional mobility during ADLs: Minimal assistance, Rolling walker General ADL Comments: minA for safety and stability, vc for safe hand placement with mobility and cues to incorporate LUE during bimanual tasks;increased time and instability noted during tasks while standing at sink level  Cognition: Cognition Overall Cognitive Status: Within Functional Limits for tasks assessed Arousal/Alertness: Awake/alert Orientation Level: Oriented X4 Year: 2022 Month:  August Day of Week: Correct Attention: Sustained, Selective Sustained Attention: Appears intact Selective Attention: Appears intact Memory: Impaired Memory Impairment: Retrieval deficit (4/5 words recalled independently, 1/5 with category cue) Awareness: Appears intact Problem Solving: Appears intact Problem Solving Impairment: Verbal complex Safety/Judgment: Appears intact Comments: repeating number in backward order  - pt able to up to 4 digits, beyond this she missed one or two; clock drawimg WFL Cognition Arousal/Alertness: Awake/alert Behavior During Therapy: WFL for tasks assessed/performed Overall Cognitive Status: Within Functional Limits for tasks assessed Area of Impairment: Problem solving Problem Solving: Difficulty sequencing, Requires verbal cues General Comments: mildly decreased attention to L side. Overthinks commands and slow problem solving and sequencing.  Physical Exam: Blood pressure 125/77, pulse 62, temperature 98.3 F (36.8 C), temperature source Oral, resp. rate 16, height 5\' 10"  (1.778 m), weight (!) 142.9 kg, last menstrual period 06/11/2021, SpO2 100 %. Physical Exam Constitutional:      Appearance: She is obese.  HENT:     Head: Normocephalic and atraumatic.     Right Ear: External ear normal.     Left Ear: External ear normal.     Nose: Nose normal.     Mouth/Throat:     Mouth: Mucous membranes are moist.     Pharynx: Oropharynx is clear.  Eyes:     Extraocular Movements: Extraocular movements intact.     Conjunctiva/sclera: Conjunctivae normal.     Pupils: Pupils are equal, round, and reactive to light.  Cardiovascular:     Rate and Rhythm: Normal rate and regular rhythm.     Pulses: Normal pulses.  Heart sounds: No murmur heard.   No gallop.  Pulmonary:     Effort: Pulmonary effort is normal. No respiratory distress.     Breath sounds: Normal breath sounds. No wheezing.  Abdominal:     General: There is no distension.     Palpations:  Abdomen is soft.     Tenderness: There is no abdominal tenderness.  Musculoskeletal:        General: No swelling or tenderness. Normal range of motion.     Cervical back: Normal range of motion and neck supple.     Right lower leg: No edema.     Left lower leg: No edema.  Skin:    General: Skin is warm and dry.  Neurological:     Mental Status: She is alert.     Comments: Alert and oriented x 3. Normal insight and awareness. Intact Memory. Normal language and speech. Cranial nerve exam unremarkable except for left central 7. RUE and RLE 5/5. LUE 4/5 prox to 3/5 distally at wrist, HI. LLE 2 to 2+/5 prox to 1+ to 2/5 ADF/PF. Mild loss of LT/PP in left foot, less moving proximally. Otherwise no sensory deficits. No resting tone, DTR's 1+ to 2+.   Psychiatric:        Mood and Affect: Mood normal.        Behavior: Behavior normal.    Results for orders placed or performed during the hospital encounter of 06/24/21 (from the past 48 hour(s))  Basic metabolic panel     Status: Abnormal   Collection Time: 06/26/21  3:18 AM  Result Value Ref Range   Sodium 137 135 - 145 mmol/L   Potassium 4.5 3.5 - 5.1 mmol/L    Comment: SLIGHT HEMOLYSIS   Chloride 104 98 - 111 mmol/L   CO2 19 (L) 22 - 32 mmol/L   Glucose, Bld 85 70 - 99 mg/dL    Comment: Glucose reference range applies only to samples taken after fasting for at least 8 hours.   BUN 7 6 - 20 mg/dL   Creatinine, Ser 7.20 0.44 - 1.00 mg/dL   Calcium 8.7 (L) 8.9 - 10.3 mg/dL   GFR, Estimated >94 >70 mL/min    Comment: (NOTE) Calculated using the CKD-EPI Creatinine Equation (2021)    Anion gap 14 5 - 15    Comment: Performed at [facility], [address], [place], [place] [address]  Ferritin     Status: Abnormal   Collection Time: 06/26/21  1:46 PM  Result Value Ref Range   Ferritin 8 (L) 11 - 307 ng/mL    Comment: Performed at [facility], [address], [place], [place] [address]  CBC with Differential/Platelet      Status: Abnormal   Collection Time: 06/27/21  4:14 AM  Result Value Ref Range   WBC 6.5 4.0 - 10.5 K/uL   RBC 4.42 3.87 - 5.11 MIL/uL   Hemoglobin 11.5 (L) 12.0 - 15.0 g/dL   HCT 76.5 (L) 46.5 - 03.5 %   MCV 80.3 80.0 - 100.0 fL   MCH 26.0 26.0 - 34.0 pg   MCHC 32.4 30.0 - 36.0 g/dL   RDW 46.5 68.1 - 27.5 %   Platelets 441 (H) 150 - 400 K/uL   nRBC 0.0 0.0 - 0.2 %   Neutrophils Relative % 53 %   Neutro Abs 3.5 1.7 - 7.7 K/uL   Lymphocytes Relative 39 %   Lymphs Abs 2.5 0.7 - 4.0 K/uL  Monocytes Relative 6 %   Monocytes Absolute 0.4 0.1 - 1.0 K/uL   Eosinophils Relative 2 %   Eosinophils Absolute 0.1 0.0 - 0.5 K/uL   Basophils Relative 0 %   Basophils Absolute 0.0 0.0 - 0.1 K/uL   Immature Granulocytes 0 %   Abs Immature Granulocytes 0.01 0.00 - 0.07 K/uL    Comment: Performed at [facility], [address], [place], [place] [address]   ECHO TEE  Result Date: 06/26/2021    TRANSESOPHOGEAL ECHO REPORT   Patient Name:   Danielle Kelley Date of Exam: 06/26/2021 Medical Rec #:  [id number]      Height:       70.0 in Accession #:    [id number]     Weight:       315.0 lb Date of Birth:  07-26-1981      BSA:          2.532 m Patient Age:    39 years       BP:           113/69 mmHg Patient Gender: F              HR:           97 bpm. Exam Location:  Inpatient Procedure: Transesophageal Echo and Color Doppler Indications:     Stroke  History:         Patient has prior history of Echocardiogram examinations, most                  recent 06/24/2021. Risk Factors:Hypertension and Dyslipidemia.  Sonographer:     Leta Jungling RDCS Referring Phys:  [id number] Roe Rutherford [facility] Diagnosing Phys: Jodelle Red MD PROCEDURE: After discussion of the risks and benefits of a TEE, an informed consent was obtained from the patient. TEE procedure time was 9 minutes. The transesophogeal probe was passed without difficulty through the esophogus of the patient. Imaged were  obtained with the patient in a  left lateral decubitus position. Local oropharyngeal anesthetic was provided with Cetacaine. Sedation performed by different physician. The patient was monitored while under deep sedation. Anesthestetic sedation was provided intravenously by Anesthesiology: 397.  of Propofol. Image quality was good. The patient's vital signs; including heart rate, blood pressure, and oxygen saturation; remained stable throughout the procedure. The patient developed no complications during the procedure. IMPRESSIONS  1. Left ventricular ejection fraction, by estimation, is 60 to 65%. The left ventricle has normal function. The left ventricle has no regional wall motion abnormalities.  2. Right ventricular systolic function is normal. The right ventricular size is normal.  3. No left atrial/left atrial appendage thrombus was detected.  4. The mitral valve is normal in structure. Trivial mitral valve regurgitation. No evidence of mitral stenosis.  5. The aortic valve is tricuspid. Aortic valve regurgitation is not visualized. No aortic stenosis is present.  6. Agitated saline contrast bubble study was negative, with no evidence of any interatrial shunt. Conclusion(s)/Recommendation(s): No LA/LAA thrombus identified. Negative bubble study for interatrial shunt. No intracardiac source of embolism detected on this on this transesophageal echocardiogram. FINDINGS  Left Ventricle: Left ventricular ejection fraction, by estimation, is 60 to 65%. The left ventricle has normal function. The left ventricle has no regional wall motion abnormalities. The left ventricular internal cavity size was normal in size. Right Ventricle: The right ventricular size is normal. No increase in right ventricular wall thickness. Right ventricular systolic function is normal. Left Atrium: Left atrial size was normal  in size. No left atrial/left atrial appendage thrombus was detected. Right Atrium: Right atrial size was normal in size. Prominent Eustachian  valve. Pericardium: There is no evidence of pericardial effusion. Mitral Valve: The mitral valve is normal in structure. Trivial mitral valve regurgitation. No evidence of mitral valve stenosis. There is no evidence of mitral valve vegetation. Tricuspid Valve: The tricuspid valve is normal in structure. Tricuspid valve regurgitation is trivial. No evidence of tricuspid stenosis. There is no evidence of tricuspid valve vegetation. Aortic Valve: The aortic valve is tricuspid. Aortic valve regurgitation is not visualized. No aortic stenosis is present. There is no evidence of aortic valve vegetation. Pulmonic Valve: The pulmonic valve was normal in structure. Pulmonic valve regurgitation is trivial. No evidence of pulmonic stenosis. There is no evidence of pulmonic valve vegetation. Aorta: The aortic root and ascending aorta are structurally normal, with no evidence of dilitation. IAS/Shunts: No atrial level shunt detected by color flow Doppler. Agitated saline contrast was given intravenously to evaluate for intracardiac shunting. Agitated saline contrast bubble study was negative, with no evidence of any interatrial shunt. Jodelle RedBridgette Christopher MD Electronically signed by Jodelle RedBridgette Christopher MD Signature Date/Time: 06/26/2021/3:45:51 PM    Final    VAS US LOWER EXTREMITY VENOUS (DVT)  Result Date: 06/25/2021  Lower Venous DVT Study Patient Name:  Danielle Kelley  Date of Exam:   06/25/2021 Medical Rec #: [id number]       Accession #:    [id number] Date of Birth: 25-Aug-1981       Patient Gender: F Patient Age:   [age] years [facility] Procedure:      VAS US LOWER EXTREMITY VENOUS (DVT) Referring Phys: Scheryl MartenJINDONG XU --------------------------------------------------------------------------------  Indications: Stroke.  Risk Factors: None identified. Limitations: Body habitus and poor ultrasound/tissue interface. Comparison Study: No prior studies. Performing Technologist: Chanda BusingGregory Collins RVT   Examination Guidelines: A complete evaluation includes B-mode imaging, spectral Doppler, color Doppler, and power Doppler as needed of all accessible portions of each vessel. Bilateral testing is considered an integral part of a complete examination. Limited examinations for reoccurring indications may be performed as noted. The reflux portion of the exam is performed with the patient in reverse Trendelenburg.  +---------+---------------+---------+-----------+----------+--------------+ RIGHT    CompressibilityPhasicitySpontaneityPropertiesThrombus Aging +---------+---------------+---------+-----------+----------+--------------+ CFV      Full           Yes      Yes                                 +---------+---------------+---------+-----------+----------+--------------+ SFJ      Full                                                        +---------+---------------+---------+-----------+----------+--------------+ FV Prox  Full                                                        +---------+---------------+---------+-----------+----------+--------------+ FV Mid   Full                                                        +---------+---------------+---------+-----------+----------+--------------+  FV DistalFull                                                        +---------+---------------+---------+-----------+----------+--------------+ PFV      Full                                                        +---------+---------------+---------+-----------+----------+--------------+ POP      Full           Yes      Yes                                 +---------+---------------+---------+-----------+----------+--------------+ PTV      Full                                                        +---------+---------------+---------+-----------+----------+--------------+ PERO     Full                                                         +---------+---------------+---------+-----------+----------+--------------+   +---------+---------------+---------+-----------+----------+--------------+ LEFT     CompressibilityPhasicitySpontaneityPropertiesThrombus Aging +---------+---------------+---------+-----------+----------+--------------+ CFV      Full           Yes      Yes                                 +---------+---------------+---------+-----------+----------+--------------+ SFJ      Full                                                        +---------+---------------+---------+-----------+----------+--------------+ FV Prox  Full                                                        +---------+---------------+---------+-----------+----------+--------------+ FV Mid   Full                                                        +---------+---------------+---------+-----------+----------+--------------+ FV DistalFull                                                        +---------+---------------+---------+-----------+----------+--------------+  PFV      Full                                                        +---------+---------------+---------+-----------+----------+--------------+ POP      Full           Yes      Yes                                 +---------+---------------+---------+-----------+----------+--------------+ PTV      Full                                                        +---------+---------------+---------+-----------+----------+--------------+ PERO     Full                                                        +---------+---------------+---------+-----------+----------+--------------+     Summary: RIGHT: - There is no evidence of deep vein thrombosis in the lower extremity.  - No cystic structure found in the popliteal fossa.  LEFT: - There is no evidence of deep vein thrombosis in the lower extremity.  - No cystic structure found in the popliteal fossa.   *See table(s) above for measurements and observations. Electronically signed by Waverly Ferrari MD on 06/25/2021 at 2:50:34 PM.    Final        Medical Problem List and Plan: 1.  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  Antithrombotics: -DVT/anticoagulation:  Pharmaceutical: Lovenox  -antiplatelet therapy: DAPT X 17 more days followed by ASA alone 3. Headaches/Pain Management: tylenol prn 4. Mood: LCSW to follow for evaluation and suppor.t d  -antipsychotic agents: N/A 5. Neuropsych: This patient is capable of making decisions on her own behalf. 6. Skin/Wound Care: Routine pressure relief measures.  7. Fluids/Electrolytes/Nutrition: Monitor I/O. Check lytes in am.  8. Pre-diabetes: Hgb A1c-5.9. Will add CM restrictions.  9. Vitamin B 12 deficiency: B12 level- 141   10. Dyslipidemia: LDL 120-->now on lipitor.  11. Morbid obesity s/p gastric sleeve: Will check Vitamin B1 level, Mg and Vit D to rule out deficiency --start multivitamin.  -needs further education on diet to help reduce stroke risk and manage weight 12. Slow transit constipation:  -senna-s 2 tablets at bedtime  -miralax daily  -sorbitol prn    Jacquelynn Cree, PA-C 06/27/2021

## 2021-06-27 NOTE — Progress Notes (Signed)
Physical Therapy Treatment Patient Details Name: Danielle Kelley MRN: [id number] DOB: 11-Sep-1981 Today's Date: 06/27/2021    History of Present Illness Pt is 40 yo female who presents with L sided weakness and paresthesias on 06/24/21. 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  PMH: gastric bypass sx    PT Comments    Patient progressing towards physical therapy goals. Patient ambulated 225' with no AD and minA, patient demos very slow step through gait pattern. Cues required for increasing gait speed and improving fluidity of gait, intermittent follow through. Patient with increasing L knee instability with increasing gait speed. Difficulty dual tasking during ambulation noted. Patient reports STM problems since stroke. Continue to recommend comprehensive inpatient rehab (CIR) for post-acute therapy needs due to functional decline.     Follow Up Recommendations  CIR     Equipment Recommendations  Other (comment) (TBD)    Recommendations for Other Services       Precautions / Restrictions Precautions Precautions: Fall Restrictions Weight Bearing Restrictions: No    Mobility  Bed Mobility Overal bed mobility: Needs Assistance Bed Mobility: Supine to Sit;Sit to Supine     Supine to sit: Supervision Sit to supine: Supervision        Transfers Overall transfer level: Needs assistance Equipment used: None Transfers: Sit to/from Stand Sit to Stand: Min guard         General transfer comment: min guard for safety. patient able to recall cues provided last session of keeping R LE out in front prior to standing to challenge L LE. No physical assistance required  Ambulation/Gait Ambulation/Gait assistance: Min assist Gait Distance (Feet): 225 Feet Assistive device: Straight cane;None Gait Pattern/deviations: Decreased dorsiflexion - left;Decreased weight shift to left;Step-through pattern;Decreased stride length;Drifts  right/left;Wide base of support;Decreased stance time - left Gait velocity: decreased   General Gait Details: slow step through gait pattern. Initially ambulated 10' with [facility] but patient with difficulty sequencing. Ambulated rest of distance with no AD and min A for balance. Cues for reciprocal arm swing, slow increase of gait speed, and improving fluidity of movement. Patient with difficulty dual tasking during ambulation requiring minA at times and patient aware of need to stop ambulating to talk. Mild instability of L knee noted. Patient with awareness of need for L heel strike during ambulation   Stairs             Wheelchair Mobility    Modified Rankin (Stroke Patients Only) Modified Rankin (Stroke Patients Only) Pre-Morbid Rankin Score: No symptoms Modified Rankin: Moderately severe disability     Balance Overall balance assessment: Needs assistance Sitting-balance support: Feet supported;No upper extremity supported Sitting balance-Leahy Scale: Good     Standing balance support: No upper extremity supported;During functional activity Standing balance-Leahy Scale: Poor Standing balance comment: requires up to minA for balance                            Cognition Arousal/Alertness: Awake/alert Behavior During Therapy: WFL for tasks assessed/performed Overall Cognitive Status: Impaired/Different from baseline Area of Impairment: Problem solving;Memory                     Memory: Decreased short-term memory       Problem Solving: Slow processing General Comments: Patient has difficulty dual tasking. Patient with good awareness of deficits and need for assistance with mobility. Patient reports difficulty with short term memory since stroke  Exercises Other Exercises Other Exercises: [facility] exercises L hand    General Comments        Pertinent Vitals/Pain Pain Assessment: No/denies pain    Home Living                       Prior Function            PT Goals (current goals can now be found in the care plan section) Acute Rehab PT Goals Patient Stated Goal: return to home and work; would love to be home by kid's first day of highschool (Monday), but also reports parents day at college that she hopes to attend in Sept PT Goal Formulation: With patient Time For Goal Achievement: 07/09/21 Potential to Achieve Goals: Good Progress towards PT goals: Progressing toward goals    Frequency    Min 4X/week      PT Plan Current plan remains appropriate    Co-evaluation              AM-PAC PT "6 Clicks" Mobility   Outcome Measure  Help needed turning from your back to your side while in a flat bed without using bedrails?: None Help needed moving from lying on your back to sitting on the side of a flat bed without using bedrails?: A Little Help needed moving to and from a bed to a chair (including a wheelchair)?: A Little Help needed standing up from a chair using your arms (e.g., wheelchair or bedside chair)?: A Little Help needed to walk in hospital room?: A Little Help needed climbing 3-5 steps with a railing? : A Lot 6 Click Score: 18    End of Session Equipment Utilized During Treatment: Gait belt Activity Tolerance: Patient tolerated treatment well Patient left: in bed;with call bell/phone within reach;with bed alarm set Nurse Communication: Mobility status PT Visit Diagnosis: Unsteadiness on feet (R26.81);Difficulty in walking, not elsewhere classified (R26.2);Hemiplegia and hemiparesis Hemiplegia - Right/Left: Left Hemiplegia - dominant/non-dominant: Non-dominant Hemiplegia - caused by: Cerebral infarction     Time: 7616-0737 PT Time Calculation (min) (ACUTE ONLY): 36 min  Charges:  $Gait Training: 23-37 mins                     Danielle Kelley A. Dan Humphreys PT, DPT Acute Rehabilitation Services Pager [phone] Office [phone]    Danielle Kelley 06/27/2021, 5:47 PM

## 2021-06-27 NOTE — Progress Notes (Signed)
Occupational Therapy Treatment Patient Details Name: Danielle Kelley MRN: [id number] DOB: 26-Apr-1981 Today's Date: 06/27/2021    History of present illness Pt is 40 yo female who presents with L sided weakness and paresthesias on 06/24/21. 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  PMH: gastric bypass sx   OT comments  Pt progressing towards acute OT goals. Eager to work with therapies and regain prior level of independence. Pt currently needs up to min A with functional transfers/mobility and LB ADLs. Continue to recommend intensive rehab setting at time of d/c.    Follow Up Recommendations  CIR    Equipment Recommendations  None recommended by OT    Recommendations for Other Services      Precautions / Restrictions Precautions Precautions: Fall Restrictions Weight Bearing Restrictions: No       Mobility Bed Mobility Overal bed mobility: Needs Assistance Bed Mobility: Supine to Sit     Supine to sit: Supervision          Transfers Overall transfer level: Needs assistance Equipment used: 1 person hand held assist Transfers: Sit to/from Stand Sit to Stand: Min assist         General transfer comment: seeks external support    Balance Overall balance assessment: Needs assistance Sitting-balance support: Feet supported;No upper extremity supported Sitting balance-Leahy Scale: Good     Standing balance support: No upper extremity supported Standing balance-Leahy Scale: Fair Standing balance comment: fair static, seeks external support intermittently for synamic standing tasks                           ADL either performed or assessed with clinical judgement   ADL Overall ADL's : Needs assistance/impaired     Grooming: Wash/dry hands;Min guard;Minimal assistance Grooming Details (indicate cue type and reason): standing at sink. Min guard for 1 grooming task in standing, min A for >1 tasks.                  Toilet Transfer: Minimal assistance;Ambulation           Functional mobility during ADLs: Min guard;Minimal assistance General ADL Comments: Pt completed in room mobility, noted to seek external support. discussed functional tasks and exercises to work on fine motor coordination and strengthening L side.     Vision       Perception     Praxis      Cognition Arousal/Alertness: Awake/alert Behavior During Therapy: WFL for tasks assessed/performed Overall Cognitive Status: Impaired/Different from baseline                                 General Comments: difficulty dual tasking. Pt reports, and observed, cognitive fatigue.        Exercises Other Exercises Other Exercises: [facility] exercises L hand   Shoulder Instructions       General Comments      Pertinent Vitals/ Pain       Pain Assessment: No/denies pain  Home Living                                          Prior Functioning/Environment              Frequency  Min 2X/week        Progress Toward Goals  OT Goals(current goals can now be found in the care plan section)  Progress towards OT goals: Progressing toward goals  Acute Rehab OT Goals Patient Stated Goal: return to home and work; would love to be home by kid's first day of highschool (Monday), but also reports parents day at college that she hopes to attend in Sept OT Goal Formulation: With patient Time For Goal Achievement: 07/09/21 Potential to Achieve Goals: Good ADL Goals Pt Will Perform Grooming: Independently;standing Pt Will Perform Lower Body Dressing: Independently;sit to/from stand Pt Will Transfer to Toilet: Independently;ambulating Additional ADL Goal #1: Pt will independently initiate use of LUE throughout functioanl mobiltiy and ADL completion.  Plan Discharge plan remains appropriate    Co-evaluation                 AM-PAC OT "6 Clicks" Daily Activity     Outcome Measure    Help from another person eating meals?: None Help from another person taking care of personal grooming?: A Little Help from another person toileting, which includes using toliet, bedpan, or urinal?: A Little Help from another person bathing (including washing, rinsing, drying)?: A Little Help from another person to put on and taking off regular upper body clothing?: None Help from another person to put on and taking off regular lower body clothing?: A Little 6 Click Score: 20    End of Session    OT Visit Diagnosis: Other abnormalities of gait and mobility (R26.89);Muscle weakness (generalized) (M62.81);Other symptoms and signs involving cognitive function   Activity Tolerance Patient tolerated treatment well   Patient Left in bed;with call bell/phone within reach;with bed alarm set (sitting EOB eating lunch)   Nurse Communication          Time: 2536-6440 OT Time Calculation (min): 22 min  Charges: OT General Charges $OT Visit: 1 Visit OT Treatments $Self Care/Home Management : 8-22 mins  Raynald Kemp, OT Acute Rehabilitation Services Pager: [phone] Office: [phone]    Pilar Grammes 06/27/2021, 2:26 PM

## 2021-06-28 ENCOUNTER — Inpatient Hospital Stay (HOSPITAL_COMMUNITY)
Admission: RE | Admit: 2021-06-28 | Discharge: 2021-07-02 | DRG: 057 | Disposition: A | Discharge status: Home Health | Payer: BC Managed Care – PPO | Source: Intra-hospital | Attending: Physical Medicine and Rehabilitation | Admitting: Physical Medicine and Rehabilitation

## 2021-06-28 ENCOUNTER — Encounter (HOSPITAL_COMMUNITY): Payer: Self-pay | Admitting: Cardiology

## 2021-06-28 ENCOUNTER — Other Ambulatory Visit: Payer: Self-pay

## 2021-06-28 DIAGNOSIS — K5901 Slow transit constipation: Secondary | ICD-10-CM | POA: Diagnosis present

## 2021-06-28 DIAGNOSIS — G8194 Hemiplegia, unspecified affecting left nondominant side: Secondary | ICD-10-CM

## 2021-06-28 DIAGNOSIS — Z8249 Family history of ischemic heart disease and other diseases of the circulatory system: Secondary | ICD-10-CM | POA: Diagnosis not present

## 2021-06-28 DIAGNOSIS — D509 Iron deficiency anemia, unspecified: Secondary | ICD-10-CM | POA: Diagnosis present

## 2021-06-28 DIAGNOSIS — Z6841 Body Mass Index (BMI) 40.0 and over, adult: Secondary | ICD-10-CM | POA: Diagnosis not present

## 2021-06-28 DIAGNOSIS — Z888 Allergy status to other drugs, medicaments and biological substances status: Secondary | ICD-10-CM | POA: Diagnosis not present

## 2021-06-28 DIAGNOSIS — I69319 Unspecified symptoms and signs involving cognitive functions following cerebral infarction: Secondary | ICD-10-CM

## 2021-06-28 DIAGNOSIS — R2689 Other abnormalities of gait and mobility: Secondary | ICD-10-CM | POA: Diagnosis present

## 2021-06-28 DIAGNOSIS — I69398 Other sequelae of cerebral infarction: Secondary | ICD-10-CM

## 2021-06-28 DIAGNOSIS — E538 Deficiency of other specified B group vitamins: Secondary | ICD-10-CM

## 2021-06-28 DIAGNOSIS — E785 Hyperlipidemia, unspecified: Secondary | ICD-10-CM | POA: Diagnosis present

## 2021-06-28 DIAGNOSIS — I69354 Hemiplegia and hemiparesis following cerebral infarction affecting left non-dominant side: Principal | ICD-10-CM

## 2021-06-28 DIAGNOSIS — Z823 Family history of stroke: Secondary | ICD-10-CM | POA: Diagnosis not present

## 2021-06-28 DIAGNOSIS — I639 Cerebral infarction, unspecified: Secondary | ICD-10-CM | POA: Diagnosis present

## 2021-06-28 DIAGNOSIS — R7303 Prediabetes: Secondary | ICD-10-CM | POA: Diagnosis present

## 2021-06-28 DIAGNOSIS — R519 Headache, unspecified: Secondary | ICD-10-CM

## 2021-06-28 LAB — LUPUS ANTICOAGULANT PANEL
DRVVT: 35 s (ref 0.0–47.0)
PTT Lupus Anticoagulant: 33.3 s (ref 0.0–51.9)

## 2021-06-28 MED ORDER — POLYETHYLENE GLYCOL 3350 17 G PO PACK: 17.0000 g | PACK | Freq: Every day | ORAL | Status: DC | PRN

## 2021-06-28 MED ORDER — TRAZODONE HCL 50 MG PO TABS: 25.0000 mg | ORAL_TABLET | Freq: Every evening | ORAL | Status: DC | PRN

## 2021-06-28 MED ORDER — GUAIFENESIN-DM 100-10 MG/5ML PO SYRP: 5.0000 mL | ORAL_SOLUTION | Freq: Four times a day (QID) | ORAL | Status: DC | PRN

## 2021-06-28 MED ORDER — ASPIRIN 81 MG PO CHEW
81.0000 mg | CHEWABLE_TABLET | Freq: Every day | ORAL | Status: DC
  Administered 2021-06-29 – 2021-07-02 (×4): 81 mg via ORAL
  Filled 2021-06-28 (×4): qty 1

## 2021-06-28 MED ORDER — FERROUS SULFATE 325 (65 FE) MG PO TABS: 325.0000 mg | ORAL_TABLET | ORAL | 3 refills | Status: DC

## 2021-06-28 MED ORDER — CLOPIDOGREL BISULFATE 75 MG PO TABS: 75.0000 mg | ORAL_TABLET | Freq: Every day | ORAL | 0 refills | Status: DC

## 2021-06-28 MED ORDER — ALUM & MAG HYDROXIDE-SIMETH 200-200-20 MG/5ML PO SUSP: 30.0000 mL | ORAL | Status: DC | PRN

## 2021-06-28 MED ORDER — ACETAMINOPHEN 325 MG PO TABS: 650.0000 mg | ORAL_TABLET | ORAL | Status: DC | PRN

## 2021-06-28 MED ORDER — ASPIRIN 81 MG PO CHEW: 81.0000 mg | CHEWABLE_TABLET | Freq: Every day | ORAL | 1 refills | Status: DC

## 2021-06-28 MED ORDER — ATORVASTATIN CALCIUM 40 MG PO TABS
40.0000 mg | ORAL_TABLET | Freq: Every day | ORAL | Status: DC
  Administered 2021-06-29 – 2021-07-02 (×4): 40 mg via ORAL
  Filled 2021-06-28 (×4): qty 1

## 2021-06-28 MED ORDER — ADULT MULTIVITAMIN W/MINERALS CH
1.0000 | ORAL_TABLET | Freq: Every day | ORAL | Status: DC
  Administered 2021-06-28 – 2021-07-01 (×4): 1 via ORAL
  Filled 2021-06-28 (×4): qty 1

## 2021-06-28 MED ORDER — BISACODYL 10 MG RE SUPP
10.0000 mg | Freq: Every day | RECTAL | Status: DC | PRN
  Administered 2021-06-28: 10 mg via RECTAL
  Filled 2021-06-28: qty 1

## 2021-06-28 MED ORDER — DIPHENHYDRAMINE HCL 12.5 MG/5ML PO ELIX: 12.5000 mg | ORAL_SOLUTION | Freq: Four times a day (QID) | ORAL | Status: DC | PRN

## 2021-06-28 MED ORDER — FERROUS SULFATE 325 (65 FE) MG PO TABS
325.0000 mg | ORAL_TABLET | Freq: Every day | ORAL | Status: DC
  Administered 2021-06-29 – 2021-07-01 (×3): 325 mg via ORAL
  Filled 2021-06-28 (×3): qty 1

## 2021-06-28 MED ORDER — SENNOSIDES-DOCUSATE SODIUM 8.6-50 MG PO TABS
2.0000 | ORAL_TABLET | Freq: Two times a day (BID) | ORAL | Status: DC
  Administered 2021-06-28 – 2021-07-02 (×7): 2 via ORAL
  Filled 2021-06-28 (×9): qty 2

## 2021-06-28 MED ORDER — SORBITOL 70 % SOLN: 30.0000 mL | Freq: Every day | Status: DC | PRN

## 2021-06-28 MED ORDER — CLOPIDOGREL BISULFATE 75 MG PO TABS
75.0000 mg | ORAL_TABLET | Freq: Every day | ORAL | Status: DC
  Administered 2021-06-29 – 2021-07-01 (×3): 75 mg via ORAL
  Filled 2021-06-28 (×3): qty 1

## 2021-06-28 MED ORDER — FLEET ENEMA 7-19 GM/118ML RE ENEM: 1.0000 | ENEMA | Freq: Once | RECTAL | Status: DC | PRN

## 2021-06-28 MED ORDER — ATORVASTATIN CALCIUM 40 MG PO TABS: 40.0000 mg | ORAL_TABLET | Freq: Every day | ORAL | Status: DC

## 2021-06-28 MED ORDER — ACETAMINOPHEN 325 MG PO TABS
325.0000 mg | ORAL_TABLET | ORAL | Status: DC | PRN
  Administered 2021-06-29 – 2021-06-30 (×2): 650 mg via ORAL
  Filled 2021-06-28 (×2): qty 2

## 2021-06-28 MED ORDER — POLYETHYLENE GLYCOL 3350 17 G PO PACK: 17.0000 g | PACK | Freq: Every day | ORAL | 0 refills | Status: DC

## 2021-06-28 MED ORDER — PROCHLORPERAZINE 25 MG RE SUPP: 12.5000 mg | Freq: Four times a day (QID) | RECTAL | Status: DC | PRN

## 2021-06-28 MED ORDER — CYANOCOBALAMIN 1000 MCG PO TABS: 1000.0000 ug | ORAL_TABLET | Freq: Every day | ORAL | Status: DC

## 2021-06-28 MED ORDER — LORATADINE 10 MG PO TABS
10.0000 mg | ORAL_TABLET | Freq: Every day | ORAL | Status: DC
  Administered 2021-06-30 – 2021-07-02 (×3): 10 mg via ORAL
  Filled 2021-06-28 (×4): qty 1

## 2021-06-28 MED ORDER — POLYETHYLENE GLYCOL 3350 17 G PO PACK
17.0000 g | PACK | Freq: Every day | ORAL | Status: DC
  Administered 2021-06-28: 17 g via ORAL
  Filled 2021-06-28 (×2): qty 1

## 2021-06-28 MED ORDER — SENNOSIDES-DOCUSATE SODIUM 8.6-50 MG PO TABS: 1.0000 | ORAL_TABLET | Freq: Two times a day (BID) | ORAL | Status: DC

## 2021-06-28 MED ORDER — VITAMIN B-12 1000 MCG PO TABS
1000.0000 ug | ORAL_TABLET | Freq: Every day | ORAL | Status: DC
  Administered 2021-06-29 – 2021-07-02 (×4): 1000 ug via ORAL
  Filled 2021-06-28 (×4): qty 1

## 2021-06-28 MED ORDER — PROCHLORPERAZINE EDISYLATE 10 MG/2ML IJ SOLN: 5.0000 mg | Freq: Four times a day (QID) | INTRAMUSCULAR | Status: DC | PRN

## 2021-06-28 MED ORDER — ENOXAPARIN SODIUM 80 MG/0.8ML IJ SOSY
70.0000 mg | PREFILLED_SYRINGE | INTRAMUSCULAR | Status: DC
  Administered 2021-06-28 – 2021-06-30 (×3): 70 mg via SUBCUTANEOUS
  Filled 2021-06-28 (×3): qty 0.8

## 2021-06-28 MED ORDER — PROCHLORPERAZINE MALEATE 5 MG PO TABS: 5.0000 mg | ORAL_TABLET | Freq: Four times a day (QID) | ORAL | Status: DC | PRN

## 2021-06-28 NOTE — H&P (Addendum)
Physical Medicine and Rehabilitation Admission H&P        Chief Complaint  Patient presents with   Weakness      HPI: Danielle Kelley is a 40 year old female with history of morbid obesity s/p sleein relatively good health who was admitted on 06/24/21 with reports of left sided numbness with weakness, heaviness and difficulty walking. UDS  negative. MRI brain done revealing small acute infarct in right posterior frontal corona radiata and MRA negative for LVO/stenosis. TEE done revealing EF 60-65% with no wall abnormality and negative for shunting, PFO or thrombus. Dr. Justice Britain that stroke likely due to small vessel disease but atypical and recommended DAPT X 3 weeks followed by ASA alone. Coagulopathy panel pending. She continues to be limited by L-sided weakness with numbness, left knee instability with fatigue and balance deficits. CIR recommended due to functional decline.      Review of Systems  Constitutional:  Negative for chills and fever.  HENT:  Negative for hearing loss and tinnitus.   Eyes:  Negative for blurred vision and double vision.  Respiratory:  Negative for cough and hemoptysis.   Cardiovascular:  Negative for chest pain and palpitations.  Gastrointestinal:  Positive for constipation. Negative for nausea and vomiting.  Genitourinary:  Negative for dysuria.  Musculoskeletal:  Negative for myalgias.  Skin:  Negative for rash.  Neurological:  Positive for tingling, sensory change and focal weakness. Negative for headaches.  Psychiatric/Behavioral:  Negative for depression and suicidal ideas.           Past Medical History:  Diagnosis Date   Obesity     Seasonal allergies               Past Surgical History:  Procedure Laterality Date   ABDOMINAL SURGERY   04/06/2017    Gastric sleeve   CESAREAN SECTION        2004, 2006, 2007.           Family History  Problem Relation Age of Onset   Stroke Mother     Hypertension Mother     Hypertension Maternal  Grandmother     Hypertension Maternal Grandfather        Social History: Moved to [facility] 2021 for work--works in HR for  Her mother and children live with her. She  has no history on file for tobacco use, alcohol use, and drug use.     Allergies: No Known Allergies           Medications Prior to Admission  Medication Sig Dispense Refill   cetirizine (ZYRTEC) 10 MG tablet Take 10 mg by mouth daily as needed for allergies.          Drug Regimen Review  Drug regimen was reviewed and remains appropriate with no significant issues identified   Home: Home Living Family/patient expects to be discharged to:: Private residence Living Arrangements: Parent, Children Available Help at Discharge: Family, Available 24 hours/day Type of Home: House Home Access: Stairs to enter [employer] of Steps: 4 Entrance Stairs-Rails: Right Home Layout: Two level Alternate Level Stairs-Number of Steps: flight Alternate Level Stairs-Rails: Right Home Equipment: None Additional Comments: pt's mom who had a CVA, lives with her and is independent with self care but does not drive. 2 teenagers at home as well.  Lives With: Other (Comment) (mother lives with her and her children live with her)   Functional History: Prior Function Level of Independence: Independent Comments: works in [employer] for [employer],  can work from home   Functional Status:  Mobility: Bed Mobility Overal bed mobility: Needs Assistance Bed Mobility: Supine to Sit, Sit to Supine Supine to sit: Supervision Sit to supine: Supervision General bed mobility comments: Requiring increased time for L side, also having to self assist L leg back to bed Transfers Overall transfer level: Needs assistance Equipment used: Rolling walker (2 wheeled) Transfers: Sit to/from Stand Sit to Stand: Min assist General transfer comment: Performed x 4 for transfers (further for exercise - see ther ex).  Good carry over with hand placement.  Increased  time to rise with min A to steady at times. Ambulation/Gait Ambulation/Gait assistance: Min assist Gait Distance (Feet): 80 Feet (80' then 5') Assistive device: Rolling walker (2 wheeled), None Gait Pattern/deviations: Decreased dorsiflexion - left, Decreased weight shift to left, Step-through pattern General Gait Details: Pt with slow gait pattern but able to progress to step through gait.  Ambulated 60' in hallway with RW then wanted to attempt ambulation from sink to bed without AD (5').  With ambulation demonstrating decreased dorsiflexion and increased supination - able to mostly correct with verbal cues -pt also aware and self correcting at times.  Also, with mild instability at knee with fatigue and without RW.  REcommended continuing RW for now. Gait velocity: decreased Gait velocity interpretation: <1.31 ft/sec, indicative of household ambulator   ADL: ADL Overall ADL's : Needs assistance/impaired Eating/Feeding: Modified independent, Sitting Grooming: Wash/dry hands, Oral care, Minimal assistance, Standing Grooming Details (indicate cue type and reason): at sink level Upper Body Bathing: Set up, Sitting Lower Body Bathing: Min guard, Sit to/from stand Upper Body Dressing : Set up, Sitting Lower Body Dressing: Minimal assistance, Sit to/from stand Lower Body Dressing Details (indicate cue type and reason): pt re Toilet Transfer: Minimal assistance, Ambulation, RW Toileting- Clothing Manipulation and Hygiene: Minimal assistance, Sit to/from stand Functional mobility during ADLs: Minimal assistance, Rolling walker General ADL Comments: minA for safety and stability, vc for safe hand placement with mobility and cues to incorporate LUE during bimanual tasks;increased time and instability noted during tasks while standing at sink level   Cognition: Cognition Overall Cognitive Status: Within Functional Limits for tasks assessed Arousal/Alertness: Awake/alert Orientation Level:  Oriented X4 Year: 2022 Month: August Day of Week: Correct Attention: Sustained, Selective Sustained Attention: Appears intact Selective Attention: Appears intact Memory: Impaired Memory Impairment: Retrieval deficit (4/5 words recalled independently, 1/5 with category cue) Awareness: Appears intact Problem Solving: Appears intact Problem Solving Impairment: Verbal complex Safety/Judgment: Appears intact Comments: repeating number in backward order  - pt able to up to 4 digits, beyond this she missed one or two; clock drawimg WFL Cognition Arousal/Alertness: Awake/alert Behavior During Therapy: WFL for tasks assessed/performed Overall Cognitive Status: Within Functional Limits for tasks assessed Area of Impairment: Problem solving Problem Solving: Difficulty sequencing, Requires verbal cues General Comments: mildly decreased attention to L side. Overthinks commands and slow problem solving and sequencing.   Physical Exam: Blood pressure 125/77, pulse 62, temperature 98.3 F (36.8 C), temperature source Oral, resp. rate 16, height  (1.778 m), weight (!) 142.9 kg, last menstrual period 06/11/2021, SpO2 100 %. Physical Exam Constitutional:      Appearance: She is obese.  HENT:     Head: Normocephalic and atraumatic.     Right Ear: External ear normal.     Left Ear: External ear normal.     Nose: Nose normal.     Mouth/Throat:     Mouth: Mucous membranes are moist.  Pharynx: Oropharynx is clear.  Eyes:     Extraocular Movements: Extraocular movements intact.     Conjunctiva/sclera: Conjunctivae normal.     Pupils: Pupils are equal, round, and reactive to light.  Cardiovascular:     Rate and Rhythm: Normal rate and regular rhythm.     Pulses: Normal pulses.     Heart sounds: No murmur heard.   No gallop.  Pulmonary:     Effort: Pulmonary effort is normal. No respiratory distress.     Breath sounds: Normal breath sounds. No wheezing.  Abdominal:     General: There  is no distension.     Palpations: Abdomen is soft.     Tenderness: There is no abdominal tenderness.  Musculoskeletal:        General: No swelling or tenderness. Normal range of motion.     Cervical back: Normal range of motion and neck supple.     Right lower leg: No edema.     Left lower leg: No edema.  Skin:    General: Skin is warm and dry.  Neurological:     Mental Status: She is alert.     Comments: Alert and oriented x 3. Normal insight and awareness. Intact Memory. Normal language and speech. Cranial nerve exam unremarkable except for left central 7. RUE and RLE 5/5. LUE 4/5 prox to 3/5 distally at wrist, HI. LLE 2 to 2+/5 prox to 1+ to 2/5 ADF/PF. Mild loss of LT/PP in left foot, less moving proximally. Otherwise no sensory deficits. No resting tone, DTR's 1+ to 2+.   Psychiatric:        Mood and Affect: Mood normal.        Behavior: Behavior normal.      Lab Results Last 48 Hours        Results for orders placed or performed during the hospital encounter of 06/24/21 (from the past 48 hour(s))  Basic metabolic panel     Status: Abnormal    Collection Time: 06/26/21  3:18 AM  Result Value Ref Range    Sodium 137 135 - 145 mmol/L    Potassium 4.5 3.5 - 5.1 mmol/L      Comment: SLIGHT HEMOLYSIS    Chloride 104 98 - 111 mmol/L    CO2 19 (L) 22 - 32 mmol/L    Glucose, Bld 85 70 - 99 mg/dL      Comment: Glucose reference range applies only to samples taken after fasting for at least 8 hours.    BUN 7 6 - 20 mg/dL    Creatinine, Ser 6.04 0.44 - 1.00 mg/dL    Calcium 8.7 (L) 8.9 - 10.3 mg/dL    GFR, Estimated >54 >09 mL/min      Comment: (NOTE) Calculated using the CKD-EPI Creatinine Equation (2021)      Anion gap 14 5 - 15      Comment: Performed at [facility], [address], [place], [place] [address]  Ferritin     Status: Abnormal    Collection Time: 06/26/21  1:46 PM  Result Value Ref Range    Ferritin 8 (L) 11 - 307 ng/mL      Comment: Performed at [facility], [address], [place], [place] [address]  CBC with Differential/Platelet     Status: Abnormal    Collection Time: 06/27/21  4:14 AM  Result Value Ref Range    WBC 6.5 4.0 - 10.5 K/uL    RBC 4.42 3.87 -  5.11 MIL/uL    Hemoglobin 11.5 (L) 12.0 - 15.0 g/dL    HCT 16.1 (L) 09.6 - 46.0 %    MCV 80.3 80.0 - 100.0 fL    MCH 26.0 26.0 - 34.0 pg    MCHC 32.4 30.0 - 36.0 g/dL    RDW 04.5 40.9 - 81.1 %    Platelets 441 (H) 150 - 400 K/uL    nRBC 0.0 0.0 - 0.2 %    Neutrophils Relative % 53 %    Neutro Abs 3.5 1.7 - 7.7 K/uL    Lymphocytes Relative 39 %    Lymphs Abs 2.5 0.7 - 4.0 K/uL    Monocytes Relative 6 %    Monocytes Absolute 0.4 0.1 - 1.0 K/uL    Eosinophils Relative 2 %    Eosinophils Absolute 0.1 0.0 - 0.5 K/uL    Basophils Relative 0 %    Basophils Absolute 0.0 0.0 - 0.1 K/uL    Immature Granulocytes 0 %    Abs Immature Granulocytes 0.01 0.00 - 0.07 K/uL      Comment: Performed at [facility], [address] [place], [place] [address]       Imaging Results (Last 48 hours)  ECHO TEE   Result Date: 06/26/2021    TRANSESOPHOGEAL ECHO REPORT   Patient Name:   Jaspreet Ponds Date of Exam: 06/26/2021 Medical Rec #:  [id number]      Height:       70.0 in Accession #:    [id number]     Weight:       315.0 lb Date of Birth:  Sep 02, 1981      BSA:          2.532 m Patient Age:    39 years       BP:           113/69 mmHg Patient Gender: F              HR:           97 bpm. Exam Location:  Inpatient Procedure: Transesophageal Echo and Color Doppler Indications:     Stroke  History:         Patient has prior history of Echocardiogram examinations, most                  recent 06/24/2021. Risk Factors:Hypertension and Dyslipidemia.  Sonographer:     Leta Jungling RDCS Referring Phys:  [id number] Roe Rutherford [facility] Diagnosing Phys: Jodelle Red MD PROCEDURE: After discussion of the risks and benefits of a TEE, an informed consent was obtained from the patient. TEE  procedure time was 9 minutes. The transesophogeal probe was passed without difficulty through the esophogus of the patient. Imaged were  obtained with the patient in a left lateral decubitus position. Local oropharyngeal anesthetic was provided with Cetacaine. Sedation performed by different physician. The patient was monitored while under deep sedation. Anesthestetic sedation was provided intravenously by Anesthesiology: 397.  of Propofol. Image quality was good. The patient's vital signs; including heart rate, blood pressure, and oxygen saturation; remained stable throughout the procedure. The patient developed no complications during the procedure. IMPRESSIONS  1. Left ventricular ejection fraction, by estimation, is 60 to 65%. The left ventricle has normal function. The left ventricle has no regional wall motion abnormalities.  2. Right ventricular systolic function is normal. The right ventricular size is normal.  3. No left atrial/left atrial appendage thrombus was detected.  4. The mitral valve is normal  in structure. Trivial mitral valve regurgitation. No evidence of mitral stenosis.  5. The aortic valve is tricuspid. Aortic valve regurgitation is not visualized. No aortic stenosis is present.  6. Agitated saline contrast bubble study was negative, with no evidence of any interatrial shunt. Conclusion(s)/Recommendation(s): No LA/LAA thrombus identified. Negative bubble study for interatrial shunt. No intracardiac source of embolism detected on this on this transesophageal echocardiogram. FINDINGS  Left Ventricle: Left ventricular ejection fraction, by estimation, is 60 to 65%. The left ventricle has normal function. The left ventricle has no regional wall motion abnormalities. The left ventricular internal cavity size was normal in size. Right Ventricle: The right ventricular size is normal. No increase in right ventricular wall thickness. Right ventricular systolic function is normal. Left Atrium: Left  atrial size was normal in size. No left atrial/left atrial appendage thrombus was detected. Right Atrium: Right atrial size was normal in size. Prominent Eustachian valve. Pericardium: There is no evidence of pericardial effusion. Mitral Valve: The mitral valve is normal in structure. Trivial mitral valve regurgitation. No evidence of mitral valve stenosis. There is no evidence of mitral valve vegetation. Tricuspid Valve: The tricuspid valve is normal in structure. Tricuspid valve regurgitation is trivial. No evidence of tricuspid stenosis. There is no evidence of tricuspid valve vegetation. Aortic Valve: The aortic valve is tricuspid. Aortic valve regurgitation is not visualized. No aortic stenosis is present. There is no evidence of aortic valve vegetation. Pulmonic Valve: The pulmonic valve was normal in structure. Pulmonic valve regurgitation is trivial. No evidence of pulmonic stenosis. There is no evidence of pulmonic valve vegetation. Aorta: The aortic root and ascending aorta are structurally normal, with no evidence of dilitation. IAS/Shunts: No atrial level shunt detected by color flow Doppler. Agitated saline contrast was given intravenously to evaluate for intracardiac shunting. Agitated saline contrast bubble study was negative, with no evidence of any interatrial shunt. Jodelle RedBridgette Christopher MD Electronically signed by Jodelle RedBridgette Christopher MD Signature Date/Time: 06/26/2021/3:45:51 PM    Final     VAS US LOWER EXTREMITY VENOUS (DVT)   Result Date: 06/25/2021  Lower Venous DVT Study Patient Name:  Danielle CollinJESSICA Kelley  Date of Exam:   06/25/2021 Medical Rec #: [id number]       Accession #:    [id number] Date of Birth: 03/30/1981       Patient Gender: F Patient Age:   [age] years [facility] Procedure:      VAS US LOWER EXTREMITY VENOUS (DVT) Referring Phys: Scheryl MartenJINDONG XU --------------------------------------------------------------------------------  Indications: Stroke.  Risk Factors:  None identified. Limitations: Body habitus and poor ultrasound/tissue interface. Comparison Study: No prior studies. Performing Technologist: Chanda BusingGregory Collins RVT  Examination Guidelines: A complete evaluation includes B-mode imaging, spectral Doppler, color Doppler, and power Doppler as needed of all accessible portions of each vessel. Bilateral testing is considered an integral part of a complete examination. Limited examinations for reoccurring indications may be performed as noted. The reflux portion of the exam is performed with the patient in reverse Trendelenburg.  +---------+---------------+---------+-----------+----------+--------------+ RIGHT    CompressibilityPhasicitySpontaneityPropertiesThrombus Aging +---------+---------------+---------+-----------+----------+--------------+ CFV      Full           Yes      Yes                                 +---------+---------------+---------+-----------+----------+--------------+ SFJ      Full                                                        +---------+---------------+---------+-----------+----------+--------------+  FV Prox  Full                                                        +---------+---------------+---------+-----------+----------+--------------+ FV Mid   Full                                                        +---------+---------------+---------+-----------+----------+--------------+ FV DistalFull                                                        +---------+---------------+---------+-----------+----------+--------------+ PFV      Full                                                        +---------+---------------+---------+-----------+----------+--------------+ POP      Full           Yes      Yes                                 +---------+---------------+---------+-----------+----------+--------------+ PTV      Full                                                         +---------+---------------+---------+-----------+----------+--------------+ PERO     Full                                                        +---------+---------------+---------+-----------+----------+--------------+   +---------+---------------+---------+-----------+----------+--------------+ LEFT     CompressibilityPhasicitySpontaneityPropertiesThrombus Aging +---------+---------------+---------+-----------+----------+--------------+ CFV      Full           Yes      Yes                                 +---------+---------------+---------+-----------+----------+--------------+ SFJ      Full                                                        +---------+---------------+---------+-----------+----------+--------------+ FV Prox  Full                                                        +---------+---------------+---------+-----------+----------+--------------+  FV Mid   Full                                                        +---------+---------------+---------+-----------+----------+--------------+ FV DistalFull                                                        +---------+---------------+---------+-----------+----------+--------------+ PFV      Full                                                        +---------+---------------+---------+-----------+----------+--------------+ POP      Full           Yes      Yes                                 +---------+---------------+---------+-----------+----------+--------------+ PTV      Full                                                        +---------+---------------+---------+-----------+----------+--------------+ PERO     Full                                                        +---------+---------------+---------+-----------+----------+--------------+     Summary: RIGHT: - There is no evidence of deep vein thrombosis in the lower extremity.  - No cystic structure found in  the popliteal fossa.  LEFT: - There is no evidence of deep vein thrombosis in the lower extremity.  - No cystic structure found in the popliteal fossa.  *See table(s) above for measurements and observations. Electronically signed by Waverly Ferrari MD on 06/25/2021 at 2:50:34 PM.    Final              Medical Problem List and Plan: 1.  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  Antithrombotics: -DVT/anticoagulation:  Pharmaceutical: Lovenox             -antiplatelet therapy: DAPT X 17 more days followed by ASA alone 3. Headaches/Pain Management: tylenol prn 4. Mood: LCSW to follow for evaluation and suppor.t d             -antipsychotic agents: N/A 5. Neuropsych: This patient is capable of making decisions on her own behalf. 6. Skin/Wound  Care: Routine pressure relief measures.  7. Fluids/Electrolytes/Nutrition: Monitor I/O. Check lytes in am.  8. Pre-diabetes: Hgb A1c-5.9. Will add CM restrictions.  9. Vitamin B 12 deficiency: B12 level- 141   10. Dyslipidemia: LDL 120-->now on lipitor.  11. Morbid obesity s/p gastric sleeve: Will check Vitamin B1 level, Mg and Vit D to rule out deficiency --start multivitamin.  -needs further education on diet to help reduce stroke risk and manage weight 12. Slow transit constipation:             -senna-s 2 tablets bid             -miralax daily             -sorbitol prn     Jacquelynn Cree, PA-C 06/27/2021   I have personally performed a face to face diagnostic evaluation of this patient and formulated the key components of the plan.  Additionally, I have personally reviewed laboratory data, imaging studies, as well as relevant notes and concur with the physician assistant's documentation above.  The patient's status has not changed from the original H&P.  Any changes in  documentation from the acute care chart have been noted above.  Ranelle Oyster, MD, [place] [facility]

## 2021-06-28 NOTE — Progress Notes (Signed)
Order to discharge to CIR.  Report called to 4W RN.  Stroke education provided by handout and reviewed with patient.  Pt verbalized understanding.  Pt transported to 4W by wheelchair.

## 2021-06-28 NOTE — Progress Notes (Signed)
Inpatient Rehabilitation Medication Review by a Pharmacist  A complete drug regimen review was completed for this patient to identify any potential clinically significant medication issues.  High Risk Drug Classes Is patient taking? Indication by Medication  Antipsychotic No   Anticoagulant Yes   Antibiotic No   Opioid No   Antiplatelet Yes ASA and Plavix s/p CVA  Hypoglycemics/insulin No   Vasoactive Medication No   Chemotherapy No   Other No      Type of Medication Issue Identified Description of Issue Recommendation(s)  Drug Interaction(s) (clinically significant)     Duplicate Therapy     Allergy     No Medication Administration End Date     Incorrect Dose     Additional Drug Therapy Needed     Significant med changes from prior encounter (inform family/care partners about these prior to discharge).    Other  The patient was prescribed DAPT for a total of 3 weeks and then transition to ASA only. While inpatient, this patient received 4 total doses of Plavix. The patient has a total of 17 remaining doses. The current order for this medication includes 18 total doses.  Adjust Plavix order to end after 17 total doses instead of the current 18 total doses.     Clinically significant medication issues were identified that warrant physician communication and completion of prescribed/recommended actions by midnight of the next day:  No  Name of provider notified for urgent issues identified:   Provider Method of Notification: Pharmacists follow up     Pharmacist comments: Please review Plavix order for correct length of therapy.   Time spent performing this drug regimen review (minutes):  15  Isaias Sakai, PharmD, [facility] Resident [phone] 06/28/2021 5:28 PM

## 2021-06-28 NOTE — Discharge Instructions (Addendum)
Dear [name],  Thank you for letting us participate in your care. You were hospitalized for left-sided weakness and found to have a stroke. This was treated with anti-platelet medication and rehabilitation with physical therapy, occupational therapy and speech therapy. You were also started on a cholesterol medication called Atorvastatin, to help decrease your risk of future strokes. You have been recommended for inpatient rehabilitation.   POST-HOSPITAL & CARE INSTRUCTIONS You were started on both Aspirin and Plavix for the stroke. You should continue to take both daily until 9/12. At this time you should continue to take Aspirin daily.  Continue to take the Atorvastatin daily. You should have repeat cholesterol labs with your primary care doctor in about 6 weeks. You should call to schedule an appointment with the Neurologist listed below, to be seen in approximately 1 month.  You should call to schedule a follow up with your primary care physician. Go to your follow up appointments (listed below)   DOCTOR'S APPOINTMENT   No future appointments.  Follow-up Information     Micki Riley, MD. Schedule an appointment as soon as possible for a visit in 1 month(s).   Specialties: Neurology, Radiology Why: stroke clinic Contact information: [address] [place] [address] [phone]                 Take care and be well!  Family Medicine Teaching Service Inpatient Team [place]  [facility]  [address] [place], [place] [address] [phone]

## 2021-06-28 NOTE — Progress Notes (Signed)
Family Medicine Teaching Service Daily Progress Note Intern Pager: [phone]  Patient name: Danielle Kelley Medical record number: [id number] Date of birth: 01-14-1981 Age: 40 y.o. Gender: female  Primary Care Provider: Virgilio Belling, PA-C Consultants: Neurology Code Status: Full  Pt Overview and Major Events to Date:  8/22 admitted-brain MRI/MRI small acute infarct in the periventricular right posterior frontal corona radiata; no large vessel occlusion  Assessment and Plan: Danielle Kelley is a 40 year old female admitted with left-sided weakness and paresthesias, found to have acute CVA.  Past medical history significant for obesity s/p gastric bypass.  Acute CVA, unknown etiology Hemodynamically stable. No headache this morning.   Homocystine normal.  ANA and lupus anticoagulant pending, can be followed up outpatient. Continued left upper and lower extremity weakness, improving.  Fine motor function in left hand improved.  No new neurologic deficits.  Awaiting bed for CIR-pending insurance authorization, likely today.  She worked with PT yesterday and had some left knee instability with increasing gait speed, PT continues to recommend CIR.  Patient is medically stable for discharge to CIR. -Vital signs per floor -Fall precautions -PT/OT -ASA 81 mg daily and Plavix 75 DAPT for 3 weeks, then aspirin alone, per neuro recs -Neurology signed off, appreciate recommendations  Constipation Patient has yet to have a bowel movement, although we started bowel regimen yesterday.  No abdominal tenderness or distention on exam. -Encourage oral hydration -MiraLAX twice daily -Senna twice daily  Normocytic (likely mixed macrocytic and microcytic) anemia Hgb 11.5. Asymptomatic including no pallor, weakness, dizziness, tachycardia, SOB. - Ferous sulfate 325mg  every other day  Other chronic, stable conditions HLD Seasonal allergies  FEN/GI: Regular PPx: Lovenox Dispo:CIR  pending insurance  authorization . Barriers include bed availability.   Subjective:  Ms. Tarrant feels well today.  She was resting comfortably in the bed when I entered the room.  She tells me she is ready to go to CIR.  Her gait has been improving slowly.  She has no complaints, with no pain or discomfort anywhere.  She says she is eating and drinking normally.  Normal urine output.  She has yet to have a bowel movement.  No abdominal pain, nausea, vomiting.  Objective: Temp:  [98.2 F (36.8 C)-98.3 F (36.8 C)] 98.3 F (36.8 C) (08/25 2003) Pulse Rate:  [56-80] 56 (08/26 0409) Resp:  [16-19] 16 (08/26 0409) BP: (125-145)/(74-97) 142/97 (08/26 0409) SpO2:  [98 %-100 %] 100 % (08/26 0409) Physical Exam: General: Well-appearing, pleasant, conversational, no acute distress, laying in bed Cardiovascular: Regular rate and rhythm, no murmurs appreciated Respiratory: Good aeration in all lung fields, no wheezing or crackles Abdomen: Soft, nondistended, nontender, obese Extremities: Clear and fluent speech.  No aphasia.  Patient is awake, alert and oriented x4.  EOMI without ptosis.  Normal voice tone and volume.  Continued, but improved decreased left hand grip strength compared to right.  Wiggles toes bilaterally.  Diminished 3-5 strength in upper left extremity, 5 out of 5 strength in upper right extremity.  4-5 lower leg strength, 5/5 right lower leg strength.  Laboratory: Recent Labs  Lab 06/24/21 1555 06/25/21 0326 06/27/21 0414  WBC 7.2 5.8 6.5  HGB 12.2 10.9* 11.5*  HCT 37.9 33.9* 35.5*  PLT 454* 421* 441*   Recent Labs  Lab 06/24/21 0942 06/25/21 0326 06/26/21 0318  NA 137 137 137  K 3.9 3.4* 4.5  CL 106 105 104  CO2 24 25 19*  BUN 6 6 7   CREATININE 0.76 0.76 0.72  CALCIUM  8.9 8.8* 8.7*  PROT 7.3 6.5  --   BILITOT 0.4 0.5  --   ALKPHOS 61 57  --   ALT 21 21  --   AST 19 18  --   GLUCOSE 95 100* 85     Imaging/Diagnostic Tests: No results found.   Darral Dash,  DO 06/28/2021, 6:57 AM PGY-1, [facility] FPTS Intern pager: [phone], text pages welcome

## 2021-06-28 NOTE — TOC Transition Note (Signed)
Transition of Care [facility]) - CM/SW Discharge Note   Patient Details  Name: Ricca Melgarejo MRN: [id number] Date of Birth: [birth year]  Transition of Care [facility]) CM/SW Contact:  Kermit Balo, RN Phone Number: 06/28/2021, 11:10 AM   Clinical Narrative:    Patient is discharging to CIR today. CM signing off.    Final next level of care: IP Rehab Facility Barriers to Discharge: No Barriers Identified   Patient Goals and CMS Choice        Discharge Placement                       Discharge Plan and Services                                     Social Determinants of Health (SDOH) Interventions     Readmission Risk Interventions No flowsheet data found.

## 2021-06-28 NOTE — Progress Notes (Signed)
PMR Admission Coordinator Pre-Admission Assessment  Patient: Danielle Kelley is an 40 y.o., female MRN: [id number] DOB: 03/12/1981 Height: 5' 10" (177.8 cm) Weight: (!) 142.9 kg  Insurance Information HMO:     PPO: yes     PCP:      IPA:      80/20:      OTHER:  PRIMARY: [employer] [place]      Policy#: [id number]      Subscriber: pt CM Name: Angie      Phone#: [phone]     Fax#: [phone] Pre-Cert#: [id number] auth for CIR given by Angie with [employer] [place], with updates due to fax listed above on 8/31       Employer:  Benefits:  Phone #: [phone]     Name:  Eff. Date: 04/17/2020     Deduct: $2800 (met)      Out of Pocket Max: $6000 (met $3078)      Life Max: n/a CIR: 90%      SNF: 90% Outpatient: 90%     Co-Ins: 10% Home Health: 90%      Co-Ins: 10% DME: 90%     Co-Ins: 10% Providers:  SECONDARY:       Policy#:      Phone#:   Financial Counselor:       Phone#:   The "Data Collection Information Summary" for patients in Inpatient Rehabilitation Facilities with attached "Privacy Act Statement-Health Care Records" was provided and verbally reviewed with: N/A  Emergency Contact Information Contact Information     Name Relation Home Work Mobile   Mccann,Christian Friend   [phone]       Current Medical History  Patient Admitting Diagnosis: R posterior frontal corona radiata CVA  History of Present Illness:  Pt is a 40 y/o female with no significant PMH who presented to [facility] on 8/22 with L sided weakness and difficulty ambulating.  Head CT did not show any abnormalities, brain MRI showed acute infarct in the periventricular frontal corona radiata. She was evaluated by neurology and started on atorvastatin and aspirin. MRA did not show large vessel occlusion nor stenosis. Labs on admission were unremarkable. Echo was obtained and showed ejection fraction of 60 to 65%, mild LVH. Left ventricular diastolic parameters were normal Lipid panel was notable for LDL 120, patient was  started on atorvastatin 40 mg daily. DVT US showed no abnormalities.  TEE showed no abnormalities, no PFO. Homocysteine and cardiolipin antibody levels were normal, and remainder of autoimmune labs pending at discharge. Patient hemodynamically stable at discharge with no new focal neurologic deficits.  Tolerating a regular diet.  Therapy evaluations were completed and pt was recommended for CIR.   Complete NIHSS TOTAL: 4  Patient's medical record from [facility] has been reviewed by the rehabilitation admission coordinator and physician.  Past Medical History  Past Medical History:  Diagnosis Date   Obesity    Seasonal allergies     Has the patient had major surgery during 100 days prior to admission? No  Family History   family history includes Hypertension in her maternal grandfather, maternal grandmother, and mother; Stroke in her mother.  Current Medications  Current Facility-Administered Medications:    acetaminophen (TYLENOL) tablet 650 mg, 650 mg, Oral, Q4H PRN, 650 mg at 06/27/21 2106 **OR** [DISCONTINUED] acetaminophen (TYLENOL) 160 MG/5ML solution 650 mg, 650 mg, Per Tube, Q4H PRN **OR** [DISCONTINUED] acetaminophen (TYLENOL) suppository 650 mg, 650 mg, Rectal, Q4H PRN, Simmons-Robinson, Makiera, MD   aspirin chewable tablet 81 mg,   81 mg, Oral, Daily, Simmons-Robinson, Makiera, MD, 81 mg at 06/28/21 0846   atorvastatin (LIPITOR) tablet 40 mg, 40 mg, Oral, Daily, Simmons-Robinson, Makiera, MD, 40 mg at 06/28/21 0846   clopidogrel (PLAVIX) tablet 75 mg, 75 mg, Oral, Daily, Hammons, Kimberly B, RPH, 75 mg at 06/28/21 0846   enoxaparin (LOVENOX) injection 70 mg, 70 mg, Subcutaneous, Q24H, Simmons-Robinson, Makiera, MD, 70 mg at 06/27/21 1547   ferrous sulfate tablet 325 mg, 325 mg, Oral, Q breakfast, Dameron, Marisa, DO, 325 mg at 06/28/21 0846   loratadine (CLARITIN) tablet 10 mg, 10 mg, Oral, Daily, Simmons-Robinson, Makiera, MD, 10 mg at 06/28/21 0846   polyethylene glycol  (MIRALAX / GLYCOLAX) packet 17 g, 17 g, Oral, Daily, Dameron, Marisa, DO, 17 g at 06/28/21 0846   senna-docusate (Senokot-S) tablet 1 tablet, 1 tablet, Oral, BID, Dameron, Marisa, DO, 1 tablet at 06/28/21 0846   vitamin B-12 (CYANOCOBALAMIN) tablet 1,000 mcg, 1,000 mcg, Oral, Daily, Xu, Jindong, MD, 1,000 mcg at 06/28/21 0846  Patients Current Diet:  Diet Order             Diet - low sodium heart healthy           Diet Heart Room service appropriate? Yes; Fluid consistency: Thin  Diet effective now                   Precautions / Restrictions Precautions Precautions: Fall Restrictions Weight Bearing Restrictions: No   Has the patient had 2 or more falls or a fall with injury in the past year? No  Prior Activity Level Community (5-7x/wk): fully independent, no DME at baseline, works in HR, still driving  Prior Functional Level Self Care: Did the patient need help bathing, dressing, using the toilet or eating? Independent  Indoor Mobility: Did the patient need assistance with walking from room to room (with or without device)? Independent  Stairs: Did the patient need assistance with internal or external stairs (with or without device)? Independent  Functional Cognition: Did the patient need help planning regular tasks such as shopping or remembering to take medications? Independent  Patient Information Are you of Hispanic, Latino/a,or Spanish origin?: B. Yes, Mexican, Mexican American, Chicano/a What is your race?: B. Black or African American Do you need or want an interpreter to communicate with a doctor or health care staff?: 0. No  Patient's Response To:  Health Literacy and Transportation Is the patient able to respond to health literacy and transportation needs?: Yes Health Literacy - How often do you need to have someone help you when you read instructions, pamphlets, or other written material from your doctor or pharmacy?: Never In the past 12 months, has lack  of transportation kept you from medical appointments or from getting medications?: No In the past 12 months, has lack of transportation kept you from meetings, work, or from getting things needed for daily living?: No  Home Assistive Devices / Equipment Home Assistive Devices/Equipment: None Home Equipment: None  Prior Device Use: Indicate devices/aids used by the patient prior to current illness, exacerbation or injury? None of the above  Current Functional Level Cognition  Arousal/Alertness: Awake/alert Overall Cognitive Status: Impaired/Different from baseline Orientation Level: Oriented X4 General Comments: Patient has difficulty dual tasking. Patient with good awareness of deficits and need for assistance with mobility. Patient reports difficulty with short term memory since stroke Attention: Sustained, Selective Sustained Attention: Appears intact Selective Attention: Appears intact Memory: Impaired Memory Impairment: Retrieval deficit (4/5 words recalled independently, 1/5 with category   cue) Awareness: Appears intact Problem Solving: Appears intact Problem Solving Impairment: Verbal complex Safety/Judgment: Appears intact Comments: repeating number in backward order  - pt able to up to 4 digits, beyond this she missed one or two; clock drawimg WFL    Extremity Assessment (includes Sensation/Coordination)  Upper Extremity Assessment: LUE deficits/detail LUE Deficits / Details: decreased fine motor skills, decreased coordination, pt reports sensation is not as strong compared to RUE; Pt requires cues to intiate use of LUE. Strength grossly 3-/5. LUE Sensation: decreased light touch LUE Coordination: decreased fine motor, decreased gross motor  Lower Extremity Assessment: Defer to PT evaluation LLE Deficits / Details: hip flex 3-/5, knee ext 3/5, ankle df 3/5, all mvmts very slow and labored LLE Sensation: decreased proprioception, decreased light touch (tingling) LLE  Coordination: decreased gross motor    ADLs  Overall ADL's : Needs assistance/impaired Eating/Feeding: Modified independent, Sitting Grooming: Wash/dry hands, Min guard, Minimal assistance Grooming Details (indicate cue type and reason): standing at sink. Min guard for 1 grooming task in standing, min A for >1 tasks. Upper Body Bathing: Set up, Sitting Lower Body Bathing: Min guard, Sit to/from stand Upper Body Dressing : Set up, Sitting Lower Body Dressing: Minimal assistance, Sit to/from stand Lower Body Dressing Details (indicate cue type and reason): pt re Toilet Transfer: Minimal assistance, Ambulation Toileting- Clothing Manipulation and Hygiene: Minimal assistance, Sit to/from stand Functional mobility during ADLs: Min guard, Minimal assistance General ADL Comments: Pt completed in room mobility, noted to seek external support. discussed functional tasks and exercises to work on fine motor coordination and strengthening L side.    Mobility  Overal bed mobility: Needs Assistance Bed Mobility: Supine to Sit, Sit to Supine Supine to sit: Supervision Sit to supine: Supervision General bed mobility comments: Requiring increased time for L side, also having to self assist L leg back to bed    Transfers  Overall transfer level: Needs assistance Equipment used: None Transfers: Sit to/from Stand Sit to Stand: Min guard General transfer comment: min guard for safety. patient able to recall cues provided last session of keeping R LE out in front prior to standing to challenge L LE. No physical assistance required    Ambulation / Gait / Stairs / Wheelchair Mobility  Ambulation/Gait Ambulation/Gait assistance: Min assist Gait Distance (Feet): 225 Feet Assistive device: Straight cane, None Gait Pattern/deviations: Decreased dorsiflexion - left, Decreased weight shift to left, Step-through pattern, Decreased stride length, Drifts right/left, Wide base of support, Decreased stance time -  left General Gait Details: slow step through gait pattern. Initially ambulated 10' with SPC but patient with difficulty sequencing. Ambulated rest of distance with no AD and min A for balance. Cues for reciprocal arm swing, slow increase of gait speed, and improving fluidity of movement. Patient with difficulty dual tasking during ambulation requiring minA at times and patient aware of need to stop ambulating to talk. Mild instability of L knee noted. Patient with awareness of need for L heel strike during ambulation Gait velocity: decreased Gait velocity interpretation: <1.31 ft/sec, indicative of household ambulator    Posture / Balance Balance Overall balance assessment: Needs assistance Sitting-balance support: Feet supported, No upper extremity supported Sitting balance-Leahy Scale: Good Standing balance support: No upper extremity supported, During functional activity Standing balance-Leahy Scale: Poor Standing balance comment: requires up to minA for balance    Special needs/care consideration N/a   Previous Home Environment (from acute therapy documentation) Living Arrangements: Parent, Children  Lives With: Other (Comment) (mother lives with   her and her children live with her) Available Help at Discharge: Family, Available 24 hours/day Type of Home: House Home Layout: Two level Alternate Level Stairs-Rails: Right Alternate Level Stairs-Number of Steps: flight Home Access: Stairs to enter Entrance Stairs-Rails: Right Entrance Stairs-Number of Steps: 4 Home Care Services: No Additional Comments: pt's mom who had a CVA, lives with her and is independent with self care but does not drive. 2 teenagers at home as well.  Discharge Living Setting Plans for Discharge Living Setting: Patient's home, Lives with (comment) (mom and children) Type of Home at Discharge: House Discharge Home Layout: Two level Alternate Level Stairs-Rails: Right Alternate Level Stairs-Number of Steps: full  flight Discharge Home Access: Stairs to enter Entrance Stairs-Rails: Right Entrance Stairs-Number of Steps: 4 Discharge Bathroom Shower/Tub: Tub/shower unit Discharge Bathroom Toilet: Standard Discharge Bathroom Accessibility: Yes How Accessible: Accessible via walker Does the patient have any problems obtaining your medications?: No  Social/Family/Support Systems Patient Roles: Parent Contact Information: 3 children aged 15, 16, and 18 (18 y/o just moved to [place] for college) Anticipated Caregiver: n/a mod I goals Anticipated Caregiver's Contact Information: contact person is Christian McCann [phone] Ability/Limitations of Caregiver: n/a Caregiver Availability: Intermittent Discharge Plan Discussed with Primary Caregiver: Yes Is Caregiver In Agreement with Plan?: Yes Does Caregiver/Family have Issues with Lodging/Transportation while Pt is in Rehab?: No  Goals Patient/Family Goal for Rehab: PT/OT mod I, SLP n/a Expected length of stay: 6-10 days Pt/Family Agrees to Admission and willing to participate: Yes Program Orientation Provided & Reviewed with Pt/Caregiver Including Roles  & Responsibilities: Yes  Decrease burden of Care through IP rehab admission: n/a  Possible need for SNF placement upon discharge: no  Patient Condition: I have reviewed medical records from [facility], spoken with CSW, and patient. I met with patient at the bedside for inpatient rehabilitation assessment.  Patient will benefit from ongoing PT and OT, can actively participate in 3 hours of therapy a day 5 days of the week, and can make measurable gains during the admission.  Patient will also benefit from the coordinated team approach during an Inpatient Acute Rehabilitation admission.  The patient will receive intensive therapy as well as Rehabilitation physician, nursing, social worker, and care management interventions.  Due to safety, medication administration, pain management, and patient education the  patient requires 24 hour a day rehabilitation nursing.  The patient is currently min assist with mobility and basic ADLs.  Discharge setting and therapy post discharge at home with outpatient is anticipated.  Patient has agreed to participate in the Acute Inpatient Rehabilitation Program and will admit today.  Preadmission Screen Completed By:  Damon Hargrove E Kelee Cunningham, PT, DPT 06/28/2021 10:46 AM ______________________________________________________________________   Discussed status with Dr. Swartz on 06/28/21  at 10:52 AM  and received approval for admission today.  Admission Coordinator:  Samarion Ehle E Demerius Podolak, PT, DPT time 10:52 AM /Date 06/28/21    Assessment/Plan: Diagnosis: right posterior frontal corona radiata infarct Does the need for close, 24 hr/day Medical supervision in concert with the patient's rehab needs make it unreasonable for this patient to be served in a less intensive setting? Yes Co-Morbidities requiring supervision/potential complications: obesity Due to bladder management, bowel management, safety, skin/wound care, disease management, medication administration, pain management, and patient education, does the patient require 24 hr/day rehab nursing? Yes Does the patient require coordinated care of a physician, rehab nurse, PT, OT  to address physical and functional deficits in the context of the above medical diagnosis(es)? Yes Addressing deficits in   the following areas: balance, endurance, locomotion, strength, transferring, bowel/bladder control, bathing, dressing, feeding, grooming, toileting, and psychosocial support Can the patient actively participate in an intensive therapy program of at least 3 hrs of therapy 5 days a week? Yes The potential for patient to make measurable gains while on inpatient rehab is excellent Anticipated functional outcomes upon discharge from inpatient rehab: modified independent PT, modified independent OT, n/a SLP Estimated rehab length of stay to  reach the above functional goals is: 7-10days Anticipated discharge destination: Home 10. Overall Rehab/Functional Prognosis: excellent   MD Signature: Zachary T. Swartz, MD, FAAPMR [facility] 06/28/2021  

## 2021-06-28 NOTE — Progress Notes (Signed)
Physical Therapy Treatment Patient Details Name: Danielle Kelley MRN: [id number] DOB: [birth year] Today's Date: 06/28/2021    History of Present Illness Pt is 40 yo female who presents with L sided weakness and paresthesias on 06/24/21. 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  PMH: gastric bypass sx    PT Comments    Patient progressing towards physical therapy goals. Patient ambulated with dual tasking with min guard and significant decrease in gait speed. Cues for increasing gait speed with intermittent follow through. Patient has difficulty maintaining attention on more than 2 things at a time. Continue to recommend comprehensive inpatient rehab (CIR) for post-acute therapy needs.     Follow Up Recommendations  CIR     Equipment Recommendations  None recommended by PT    Recommendations for Other Services       Precautions / Restrictions Precautions Precautions: Fall Restrictions Weight Bearing Restrictions: No    Mobility  Bed Mobility Overal bed mobility: Modified Independent                  Transfers Overall transfer level: Needs assistance Equipment used: None Transfers: Sit to/from Stand Sit to Stand: Supervision         General transfer comment: supervision for safety.  Ambulation/Gait Ambulation/Gait assistance: Min guard Gait Distance (Feet): 400 Feet Assistive device: None Gait Pattern/deviations: Decreased dorsiflexion - left;Decreased weight shift to left;Step-through pattern;Decreased stride length;Drifts right/left;Wide base of support;Decreased stance time - left Gait velocity: decreased   General Gait Details: slow step through gait pattern. Cues for increasing pace to introduce more challenge during ambulation. Intermittent follow through. Patient able to talk on phone during ambulation but showed decrease in gait speed. Provided patient with cognitive dual task with maintaining of gait speed.  Introduced motor dual task (holding cup of water) with significant decrease in gait speed. Cues for increasing gait speed with good improvement. Patient with difficulty maintaining attention on more than 2 things.   Stairs             Wheelchair Mobility    Modified Rankin (Stroke Patients Only) Modified Rankin (Stroke Patients Only) Pre-Morbid Rankin Score: No symptoms Modified Rankin: Moderately severe disability     Balance Overall balance assessment: Needs assistance Sitting-balance support: Feet supported;No upper extremity supported Sitting balance-Leahy Scale: Good     Standing balance support: No upper extremity supported;During functional activity Standing balance-Leahy Scale: Poor Standing balance comment: requires min guard and slowed gait speed                            Cognition Arousal/Alertness: Awake/alert Behavior During Therapy: WFL for tasks assessed/performed Overall Cognitive Status: Impaired/Different from baseline Area of Impairment: Problem solving;Memory                     Memory: Decreased short-term memory       Problem Solving: Slow processing General Comments: Good awareness of deficits. Difficulty with short term memory requiring repetition to remember lunch order      Exercises      General Comments        Pertinent Vitals/Pain Pain Assessment: No/denies pain    Home Living                      Prior Function            PT Goals (current goals can now be found in the  care plan section) Acute Rehab PT Goals Patient Stated Goal: return to home and work; would love to be home by kid's first day of highschool (Monday), but also reports parents day at college that she hopes to attend in Sept PT Goal Formulation: With patient Time For Goal Achievement: 07/09/21 Potential to Achieve Goals: Good Progress towards PT goals: Progressing toward goals    Frequency    Min 4X/week      PT Plan  Current plan remains appropriate    Co-evaluation              AM-PAC PT "6 Clicks" Mobility   Outcome Measure  Help needed turning from your back to your side while in a flat bed without using bedrails?: None Help needed moving from lying on your back to sitting on the side of a flat bed without using bedrails?: None Help needed moving to and from a bed to a chair (including a wheelchair)?: A Little Help needed standing up from a chair using your arms (e.g., wheelchair or bedside chair)?: A Little Help needed to walk in hospital room?: A Little Help needed climbing 3-5 steps with a railing? : A Little 6 Click Score: 20    End of Session Equipment Utilized During Treatment: Gait belt Activity Tolerance: Patient tolerated treatment well Patient left: in bed;with call bell/phone within reach Nurse Communication: Mobility status PT Visit Diagnosis: Unsteadiness on feet (R26.81);Difficulty in walking, not elsewhere classified (R26.2);Hemiplegia and hemiparesis Hemiplegia - Right/Left: Left Hemiplegia - dominant/non-dominant: Non-dominant Hemiplegia - caused by: Cerebral infarction     Time: 2956-2130 PT Time Calculation (min) (ACUTE ONLY): 41 min  Charges:  $Gait Training: 38-52 mins                     [place]ville Rico A. Dan Humphreys PT, DPT Acute Rehabilitation Services Pager [phone] Office [phone]    Viviann Spare 06/28/2021, 3:54 PM

## 2021-06-28 NOTE — Progress Notes (Signed)
Inpatient Rehab Admissions Coordinator:   I have insurance authorization and a bed available for this patient to admit to CIR today. FMTS in agreement, pending rounds today.  I will speak to pt and TOC team to let them know.   Estill Dooms, PT, DPT Admissions Coordinator [phone] 06/28/21  9:52 AM

## 2021-06-28 NOTE — Plan of Care (Signed)
  Problem: Education: Goal: Knowledge of General Education information will improve Description: Including pain rating scale, medication(s)/side effects and non-pharmacologic comfort measures Outcome: Adequate for Discharge   Problem: Health Behavior/Discharge Planning: Goal: Ability to manage health-related needs will improve Outcome: Adequate for Discharge   Problem: Clinical Measurements: Goal: Ability to maintain clinical measurements within normal limits will improve Outcome: Adequate for Discharge Goal: Will remain free from infection Outcome: Adequate for Discharge Goal: Diagnostic test results will improve Outcome: Adequate for Discharge Goal: Respiratory complications will improve Outcome: Adequate for Discharge Goal: Cardiovascular complication will be avoided Outcome: Adequate for Discharge   Problem: Activity: Goal: Risk for activity intolerance will decrease Outcome: Adequate for Discharge   Problem: Nutrition: Goal: Adequate nutrition will be maintained Outcome: Adequate for Discharge   Problem: Coping: Goal: Level of anxiety will decrease Outcome: Adequate for Discharge   Problem: Elimination: Goal: Will not experience complications related to bowel motility Outcome: Adequate for Discharge Goal: Will not experience complications related to urinary retention Outcome: Adequate for Discharge   Problem: Pain Managment: Goal: General experience of comfort will improve Outcome: Adequate for Discharge   Problem: Safety: Goal: Ability to remain free from injury will improve Outcome: Adequate for Discharge   Problem: Skin Integrity: Goal: Risk for impaired skin integrity will decrease Outcome: Adequate for Discharge   Problem: Education: Goal: Knowledge of disease or condition will improve Outcome: Adequate for Discharge Goal: Knowledge of secondary prevention will improve Outcome: Adequate for Discharge Goal: Knowledge of patient specific risk factors  addressed and post discharge goals established will improve Outcome: Adequate for Discharge   Problem: Health Behavior/Discharge Planning: Goal: Ability to manage health-related needs will improve Outcome: Adequate for Discharge   Problem: Self-Care: Goal: Ability to participate in self-care as condition permits will improve Outcome: Adequate for Discharge   Problem: Intracerebral Hemorrhage Tissue Perfusion: Goal: Complications of Intracerebral Hemorrhage will be minimized Outcome: Adequate for Discharge   Problem: Ischemic Stroke/TIA Tissue Perfusion: Goal: Complications of ischemic stroke/TIA will be minimized Outcome: Adequate for Discharge

## 2021-06-28 NOTE — Discharge Summary (Addendum)
Family Medicine Teaching [facility] Discharge Summary  Patient name: Danielle Kelley Medical record number: [id number] Date of birth: 1980-11-05 Age: 40 y.o. Gender: female Date of Admission: 06/24/2021  Date of Discharge: 06/28/21 Admitting Physician: Carney Living, MD  Primary Care Provider: Virgilio Belling, PA-C Consultants: Neurology  Indication for Hospitalization: Left sided-weakness  Discharge Diagnoses/Problem List:  Active Problems:   Cerebrovascular accident (CVA) (HCC)   Acute CVA (cerebrovascular accident) (HCC)   Iron deficiency anemia   Seasonal allergies   Disposition: CIR  Discharge Condition: Stable  Discharge Exam:  General: Well-appearing, pleasant, conversational, no acute distress, laying in bed Cardiovascular: Regular rate and rhythm, no murmurs appreciated Respiratory: Good aeration in all lung fields, no wheezing or crackles Abdomen: Soft, nondistended, nontender, obese Extremities: Clear and fluent speech.  No aphasia.  Patient is awake, alert and oriented x4.  EOMI without ptosis.  Normal voice tone and volume.  Continued, but improved decreased left hand grip strength compared to right.  Wiggles toes bilaterally.  Diminished 3-5 strength in upper left extremity, 5 out of 5 strength in upper right extremity.  4-5 lower leg strength, 5/5 right lower leg strength.  Brief Hospital Course:  Danielle Kelley is a 40 y.o. female who presented with left sided weakness with hx of obesity and seasonal allergies.   Acute CVA  Patient reports that she began to have left sided weakness in her upper and lower extremities on the night prior to admission. She noticed difficulty with ambulating which prompted her to go to the ED for further evaluation. Head CT did not show any abnormalities, brain MRI showed acute infarct in the periventricular frontal corona radiata. She was evaluated by neurology and started on atorvastatin and aspirin. MRA did not show large  vessel occlusion nor stenosis. Labs on admission were unremarkable. Echo was obtained and showed ejection fraction of 60 to 65%, mild LVH. Left ventricular diastolic parameters were normal Lipid panel was notable for LDL 120, patient was started on atorvastatin 40 mg daily. DVT US showed no abnormalities.  TEE showed no abnormalities, no PFO. Homocysteine and cardiolipin antibody levels were normal, and remainder of autoimmune labs were pending at discharge. Patient hemodynamically stable at discharge with no new focal neurologic deficits. Patient was evaluated by physical therapy and qualified for CIR where she was accepted upon discharge from the inpatient family medicine service.    Issues for PCP follow up  LDL greater than 120 (goal <70).  Patient started on atorvastatin 40 mg daily. Consider repeat lipid panel in 6 weeks.  Follow-up with neurology outpatient in 1 month. Patient to continue aspirin and Plavix 75 DAPT for 3 weeks, and then aspirin alone.  Ensure patient continues to take aspirin. Continue to educate on risk factor modification. Iron-deficiency anemia. Started on iron-sulfate. Consider further workup for etiology of this. Follow up on ANA, lupus anticoagulant   Significant Procedures: None  Significant Labs and Imaging:  Recent Labs  Lab 06/24/21 1555 06/25/21 0326 06/27/21 0414  WBC 7.2 5.8 6.5  HGB 12.2 10.9* 11.5*  HCT 37.9 33.9* 35.5*  PLT 454* 421* 441*   Recent Labs  Lab 06/24/21 0942 06/25/21 0326 06/26/21 0318  NA 137 137 137  K 3.9 3.4* 4.5  CL 106 105 104  CO2 24 25 19*  GLUCOSE 95 100* 85  BUN 6 6 7   CREATININE 0.76 0.76 0.72  CALCIUM 8.9 8.8* 8.7*  ALKPHOS 61 57  --   AST 19 18  --  ALT 21 21  --   ALBUMIN 3.5 3.2*  --       Results/Tests Pending at Time of Discharge: Pending ANA, Lupus anticoagulant  Discharge Medications:  Allergies as of 06/28/2021   No Known Allergies      Medication List     TAKE these medications     acetaminophen 325 MG tablet Commonly known as: TYLENOL Take 2 tablets (650 mg total) by mouth every 4 (four) hours as needed for mild pain (or temp > 37.5 C (99.5 F)).   aspirin 81 MG chewable tablet Chew 1 tablet (81 mg total) by mouth daily.   atorvastatin 40 MG tablet Commonly known as: LIPITOR Take 1 tablet (40 mg total) by mouth daily.   cetirizine 10 MG tablet Commonly known as: ZYRTEC Take 10 mg by mouth daily as needed for allergies.   clopidogrel 75 MG tablet Commonly known as: PLAVIX Take 1 tablet (75 mg total) by mouth daily for 17 days.   cyanocobalamin 1000 MCG tablet Take 1 tablet (1,000 mcg total) by mouth daily.   ferrous sulfate 325 (65 FE) MG tablet Take 1 tablet (325 mg total) by mouth every other day.   polyethylene glycol 17 g packet Commonly known as: MIRALAX / GLYCOLAX Take 17 g by mouth daily.   senna-docusate 8.6-50 MG tablet Commonly known as: Senokot-S Take 1 tablet by mouth 2 (two) times daily.        Discharge Instructions: Please refer to Patient Instructions section of EMR for full details.  Patient was counseled important signs and symptoms that should prompt return to medical care, changes in medications, dietary instructions, activity restrictions, and follow up appointments.   Follow-Up Appointments:  Follow-up Information     Micki Riley, MD. Schedule an appointment as soon as possible for a visit in 1 month(s).   Specialties: Neurology, Radiology Why: stroke clinic Contact information: [address] [place] [address] [phone]         Sherrie Mustache T, PA-C Follow up.   Specialty: Physician Assistant Why: Call to schedule an appointment for hospital follow up. Contact information: [address] [place] [address] [phone]                 Darral Dash, DO 06/28/2021, 10:19 AM PGY-1, [facility]

## 2021-06-28 NOTE — Progress Notes (Signed)
Patient ID: Danielle Kelley, female   DOB: 11/20/1980, 40 y.o.   MRN: [id number] Met with the patient to introduce self and role of the nurse CM. Discussed rehab routine, therapy schedule and plan of care. Reviewed stroke risks including HLD (LDL 120 with goal of 70) and DAPT x 3 weeks per MD then ASA solo. Reviewed dietary modifications and tips to increase protein and calcium. Continue to follow along to discharge to address educational needs and collaborate with the team to facilitate preparation for discharge. Home with mother with hx of CVA 2018;2 teens. Hopeful to go home within a week. Margarito Liner

## 2021-06-28 NOTE — PMR Pre-admission (Signed)
PMR Admission Coordinator Pre-Admission Assessment  Patient: Danielle Kelley is an 40 y.o., female MRN: [id number] DOB: 1980-11-25 Height: _0  (177.8 cm) Weight: (!) 142.9 kg  Insurance Information HMO:     PPO: yes     PCP:      IPA:      80/20:      OTHER:  PRIMARY: [employer] [place]      Policy#: [id number]      Subscriber: pt CM Name: Janace Hoard      Phone#: [phone]     Fax#: [phone] Pre-Cert#: [id number] auth for CIR given by Janace Hoard with [place], with updates due to fax listed above on 8/31       Employer:  Benefits:  Phone #: [phone]     Name:  Eff. Date: 04/17/2020     Deduct: $2800 (met)      Out of Pocket Max: $6000 (met [phone])      Life Max: n/a CIR: 90%      SNF: 90% Outpatient: 90%     Co-Ins: 10% Home Health: 90%      Co-Ins: 10% DME: 90%     Co-Ins: 10% Providers:  SECONDARY:       Policy#:      Phone#:   Development worker, community:       Phone#:   The Therapist, art Information Summary" for patients in Inpatient Rehabilitation Facilities with attached "Privacy Act [place] Records" was provided and verbally reviewed with: N/A  Emergency Contact Information Contact Information     Name Relation Home Work Mobile   [place]   [phone]       Current Medical History  Patient Admitting Diagnosis: R posterior frontal corona radiata CVA  History of Present Illness:  Pt is a 40 y/o female with no significant PMH who presented to [facility] on 8/22 with L sided weakness and difficulty ambulating.  Head CT did not show any abnormalities, brain MRI showed acute infarct in the periventricular frontal corona radiata. She was evaluated by neurology and started on atorvastatin and aspirin. MRA did not show large vessel occlusion nor stenosis. Labs on admission were unremarkable. Echo was obtained and showed ejection fraction of 60 to 65%, mild LVH. Left ventricular diastolic parameters were normal Lipid panel was notable for LDL 120, patient was  started on atorvastatin 40 mg daily. DVT US showed no abnormalities.  TEE showed no abnormalities, no PFO. Homocysteine and cardiolipin antibody levels were normal, and remainder of autoimmune labs pending at discharge. Patient hemodynamically stable at discharge with no new focal neurologic deficits.  Tolerating a regular diet.  Therapy evaluations were completed and pt was recommended for CIR.   Complete NIHSS TOTAL: 4  Patient's medical record from [facility] has been reviewed by the rehabilitation admission coordinator and physician.  Past Medical History  Past Medical History:  Diagnosis Date   Obesity    Seasonal allergies     Has the patient had major surgery during 100 days prior to admission? No  Family History   family history includes Hypertension in her maternal grandfather, maternal grandmother, and mother; Stroke in her mother.  Current Medications  Current Facility-Administered Medications:    acetaminophen (TYLENOL) tablet 650 mg, 650 mg, Oral, Q4H PRN, 650 mg at 06/27/21 2106 **OR** [DISCONTINUED] acetaminophen (TYLENOL) 160 MG/5ML solution 650 mg, 650 mg, Per Tube, Q4H PRN **OR** [DISCONTINUED] acetaminophen (TYLENOL) suppository 650 mg, 650 mg, Rectal, Q4H PRN, Simmons-Robinson, Makiera, MD   aspirin chewable tablet 81 mg,  81 mg, Oral, Daily, Simmons-Robinson, Makiera, MD, 81 mg at 06/28/21 0846   atorvastatin (LIPITOR) tablet 40 mg, 40 mg, Oral, Daily, Simmons-Robinson, Makiera, MD, 40 mg at 06/28/21 0846   clopidogrel (PLAVIX) tablet 75 mg, 75 mg, Oral, Daily, Hammons, Kimberly B, RPH, 75 mg at 06/28/21 0846   enoxaparin (LOVENOX) injection 70 mg, 70 mg, Subcutaneous, Q24H, Simmons-Robinson, Makiera, MD, 70 mg at 06/27/21 1547   ferrous sulfate tablet 325 mg, 325 mg, Oral, Q breakfast, Dameron, Marisa, DO, 325 mg at 06/28/21 0846   loratadine (CLARITIN) tablet 10 mg, 10 mg, Oral, Daily, Simmons-Robinson, Makiera, MD, 10 mg at 06/28/21 0846   polyethylene glycol  (MIRALAX / GLYCOLAX) packet 17 g, 17 g, Oral, Daily, Dameron, Marisa, DO, 17 g at 06/28/21 0846   senna-docusate (Senokot-S) tablet 1 tablet, 1 tablet, Oral, BID, Dameron, Marisa, DO, 1 tablet at 06/28/21 0846   vitamin B-12 (CYANOCOBALAMIN) tablet 1,000 mcg, 1,000 mcg, Oral, Daily, Rosalin Hawking, MD, 1,000 mcg at 06/28/21 0347  Patients Current Diet:  Diet Order             Diet - low sodium heart healthy           Diet Heart Room service appropriate? Yes; Fluid consistency: Thin  Diet effective now                   Precautions / Restrictions Precautions Precautions: Fall Restrictions Weight Bearing Restrictions: No   Has the patient had 2 or more falls or a fall with injury in the past year? No  Prior Activity Level Community (5-7x/wk): fully independent, no DME at baseline, works in [place], still driving  Prior Functional Level Self Care: Did the patient need help bathing, dressing, using the toilet or eating? Independent  Indoor Mobility: Did the patient need assistance with walking from room to room (with or without device)? Independent  Stairs: Did the patient need assistance with internal or external stairs (with or without device)? Independent  Functional Cognition: Did the patient need help planning regular tasks such as shopping or remembering to take medications? Independent  Patient Information Are you of Hispanic, Latino/a,or Spanish origin?: B. Yes, [place], [place] American, Chicano/a What is your race?: B. Black or African American Do you need or want an interpreter to communicate with a doctor or health care staff?: 0. No  Patient's Response To:  Health Literacy and Transportation Is the patient able to respond to health literacy and transportation needs?: Yes Health Literacy - How often do you need to have someone help you when you read instructions, pamphlets, or other written material from your doctor or pharmacy?: Never In the past 12 months, has lack  of transportation kept you from medical appointments or from getting medications?: No In the past 12 months, has lack of transportation kept you from meetings, work, or from getting things needed for daily living?: No  Development worker, international aid / [place] Devices/Equipment: None Home Equipment: None  Prior Device Use: Indicate devices/aids used by the patient prior to current illness, exacerbation or injury? None of the above  Current Functional Level Cognition  Arousal/Alertness: Awake/alert Overall Cognitive Status: Impaired/Different from baseline Orientation Level: Oriented X4 General Comments: Patient has difficulty dual tasking. Patient with good awareness of deficits and need for assistance with mobility. Patient reports difficulty with short term memory since stroke Attention: Sustained, Selective Sustained Attention: Appears intact Selective Attention: Appears intact Memory: Impaired Memory Impairment: Retrieval deficit (4/5 words recalled independently, 1/5 with category  cue) Awareness: Appears intact Problem Solving: Appears intact Problem Solving Impairment: Verbal complex Safety/Judgment: Appears intact Comments: repeating number in backward order  - pt able to up to 4 digits, beyond this she missed one or two; clock drawimg [facility]    Extremity Assessment (includes Sensation/Coordination)  Upper Extremity Assessment: LUE deficits/detail LUE Deficits / Details: decreased fine motor skills, decreased coordination, pt reports sensation is not as strong compared to RUE; Pt requires cues to intiate use of LUE. Strength grossly 3-/5. LUE Sensation: decreased light touch LUE Coordination: decreased fine motor, decreased gross motor  Lower Extremity Assessment: Defer to PT evaluation LLE Deficits / Details: hip flex 3-/5, knee ext 3/5, ankle df 3/5, all mvmts very slow and labored LLE Sensation: decreased proprioception, decreased light touch (tingling) LLE  Coordination: decreased gross motor    ADLs  Overall ADL's : Needs assistance/impaired Eating/Feeding: Modified independent, Sitting Grooming: Wash/dry hands, Min guard, Minimal assistance Grooming Details (indicate cue type and reason): standing at sink. Min guard for 1 grooming task in standing, min A for >1 tasks. Upper Body Bathing: Set up, Sitting Lower Body Bathing: Min guard, Sit to/from stand Upper Body Dressing : Set up, Sitting Lower Body Dressing: Minimal assistance, Sit to/from stand Lower Body Dressing Details (indicate cue type and reason): pt re Toilet Transfer: Minimal assistance, Ambulation Toileting- Clothing Manipulation and Hygiene: Minimal assistance, Sit to/from stand Functional mobility during ADLs: Min guard, Minimal assistance General ADL Comments: Pt completed in room mobility, noted to seek external support. discussed functional tasks and exercises to work on fine motor coordination and strengthening L side.    Mobility  Overal bed mobility: Needs Assistance Bed Mobility: Supine to Sit, Sit to Supine Supine to sit: Supervision Sit to supine: Supervision General bed mobility comments: Requiring increased time for L side, also having to self assist L leg back to bed    Transfers  Overall transfer level: Needs assistance Equipment used: None Transfers: Sit to/from Stand Sit to Stand: Min guard General transfer comment: min guard for safety. patient able to recall cues provided last session of keeping R LE out in front prior to standing to challenge L LE. No physical assistance required    Ambulation / Gait / Stairs / Wheelchair Mobility  Ambulation/Gait Ambulation/Gait assistance: Herbalist (Feet): 225 Feet Assistive device: Straight cane, None Gait Pattern/deviations: Decreased dorsiflexion - left, Decreased weight shift to left, Step-through pattern, Decreased stride length, Drifts right/left, Wide base of support, Decreased stance time -  left General Gait Details: slow step through gait pattern. Initially ambulated 10' with [facility] but patient with difficulty sequencing. Ambulated rest of distance with no AD and min A for balance. Cues for reciprocal arm swing, slow increase of gait speed, and improving fluidity of movement. Patient with difficulty dual tasking during ambulation requiring minA at times and patient aware of need to stop ambulating to talk. Mild instability of L knee noted. Patient with awareness of need for L heel strike during ambulation Gait velocity: decreased Gait velocity interpretation: <1.31 ft/sec, indicative of household ambulator    Posture / Balance Balance Overall balance assessment: Needs assistance Sitting-balance support: Feet supported, No upper extremity supported Sitting balance-Leahy Scale: Good Standing balance support: No upper extremity supported, During functional activity Standing balance-Leahy Scale: Poor Standing balance comment: requires up to minA for balance    Special needs/care consideration N/a   Previous Home Environment (from acute therapy documentation) Living Arrangements: Parent, Children  Lives With: Other (Comment) (mother lives with  her and her children live with her) Available Help at Discharge: Family, Available 24 hours/day Type of Home: House Home Layout: Two level Alternate Level Stairs-Rails: Right Alternate Level Stairs-Number of Steps: flight Home Access: Stairs to enter Entrance Stairs-Rails: Right Entrance Stairs-Number of Steps: 4 Home Care Services: No Additional Comments: pt's mom who had a CVA, lives with her and is independent with self care but does not drive. 2 teenagers at home as well.  Discharge Living Setting Plans for Discharge Living Setting: Patient's home, Lives with (comment) (mom and children) Type of Home at Discharge: House Discharge Home Layout: Two level Alternate Level Stairs-Rails: Right Alternate Level Stairs-Number of Steps: full  flight Discharge Home Access: Stairs to enter Entrance Stairs-Rails: Right Entrance Stairs-Number of Steps: 4 Discharge Bathroom Shower/Tub: Tub/shower unit Discharge Bathroom Toilet: Standard Discharge Bathroom Accessibility: Yes How Accessible: Accessible via walker Does the patient have any problems obtaining your medications?: No  Social/Family/Support Systems Patient Roles: Parent Contact Information: 3 children aged 76, 87, and 57 (35 y/o just moved to [place] for college) Anticipated Caregiver: n/a mod I goals Anticipated Ambulance person Information: contact person is Renee Pain [phone] Ability/Limitations of Caregiver: n/a Caregiver Availability: Intermittent Discharge Plan Discussed with Primary Caregiver: Yes Is Caregiver In Agreement with Plan?: Yes Does Caregiver/Family have Issues with Lodging/Transportation while Pt is in Rehab?: No  Goals Patient/Family Goal for Rehab: PT/OT mod I, SLP n/a Expected length of stay: 6-10 days Pt/Family Agrees to Admission and willing to participate: Yes Program Orientation Provided & Reviewed with Pt/Caregiver Including Roles  & Responsibilities: Yes  Decrease burden of Care through IP rehab admission: n/a  Possible need for SNF placement upon discharge: no  Patient Condition: I have reviewed medical records from [facility], spoken with CSW, and patient. I met with patient at the bedside for inpatient rehabilitation assessment.  Patient will benefit from ongoing PT and OT, can actively participate in 3 hours of therapy a day 5 days of the week, and can make measurable gains during the admission.  Patient will also benefit from the coordinated team approach during an Inpatient Acute Rehabilitation admission.  The patient will receive intensive therapy as well as Rehabilitation physician, nursing, social worker, and care management interventions.  Due to safety, medication administration, pain management, and patient education the  patient requires 24 hour a day rehabilitation nursing.  The patient is currently min assist with mobility and basic ADLs.  Discharge setting and therapy post discharge at home with outpatient is anticipated.  Patient has agreed to participate in the Acute Inpatient Rehabilitation Program and will admit today.  Preadmission Screen Completed By:  Michel Santee, PT, DPT 06/28/2021 10:46 AM ______________________________________________________________________   Discussed status with Dr. Naaman Plummer on 06/28/21  at 10:52 AM  and received approval for admission today.  Admission Coordinator:  Michel Santee, PT, DPT time 10:52 AM Sudie Grumbling 06/28/21    Assessment/Plan: Diagnosis: right posterior frontal corona radiata infarct Does the need for close, 24 hr/day Medical supervision in concert with the patient's rehab needs make it unreasonable for this patient to be served in a less intensive setting? Yes Co-Morbidities requiring supervision/potential complications: obesity Due to bladder management, bowel management, safety, skin/wound care, disease management, medication administration, pain management, and patient education, does the patient require 24 hr/day rehab nursing? Yes Does the patient require coordinated care of a physician, rehab nurse, PT, OT  to address physical and functional deficits in the context of the above medical diagnosis(es)? Yes Addressing deficits in  the following areas: balance, endurance, locomotion, strength, transferring, bowel/bladder control, bathing, dressing, feeding, grooming, toileting, and psychosocial support Can the patient actively participate in an intensive therapy program of at least 3 hrs of therapy 5 days a week? Yes The potential for patient to make measurable gains while on inpatient rehab is excellent Anticipated functional outcomes upon discharge from inpatient rehab: modified independent PT, modified independent OT, n/a SLP Estimated rehab length of stay to  reach the above functional goals is: 7-10days Anticipated discharge destination: Home 10. Overall Rehab/Functional Prognosis: excellent   MD Signature: Meredith Staggers, MD, [facility] 06/28/2021

## 2021-06-29 DIAGNOSIS — I639 Cerebral infarction, unspecified: Secondary | ICD-10-CM | POA: Diagnosis not present

## 2021-06-29 LAB — CBC WITH DIFFERENTIAL/PLATELET
Abs Immature Granulocytes: 0.02 10*3/uL (ref 0.00–0.07)
Basophils Absolute: 0 10*3/uL (ref 0.0–0.1)
Basophils Relative: 0 %
Eosinophils Absolute: 0.2 10*3/uL (ref 0.0–0.5)
Eosinophils Relative: 3 %
HCT: 33.4 % — ABNORMAL LOW (ref 36.0–46.0)
Hemoglobin: 11.2 g/dL — ABNORMAL LOW (ref 12.0–15.0)
Immature Granulocytes: 0 %
Lymphocytes Relative: 37 %
Lymphs Abs: 2.7 10*3/uL (ref 0.7–4.0)
MCH: 26.7 pg (ref 26.0–34.0)
MCHC: 33.5 g/dL (ref 30.0–36.0)
MCV: 79.5 fL — ABNORMAL LOW (ref 80.0–100.0)
Monocytes Absolute: 0.4 10*3/uL (ref 0.1–1.0)
Monocytes Relative: 6 %
Neutro Abs: 3.9 10*3/uL (ref 1.7–7.7)
Neutrophils Relative %: 54 %
Platelets: 401 10*3/uL — ABNORMAL HIGH (ref 150–400)
RBC: 4.2 MIL/uL (ref 3.87–5.11)
RDW: 13.7 % (ref 11.5–15.5)
WBC: 7.2 10*3/uL (ref 4.0–10.5)
nRBC: 0 % (ref 0.0–0.2)

## 2021-06-29 LAB — VITAMIN D 25 HYDROXY (VIT D DEFICIENCY, FRACTURES): Vit D, 25-Hydroxy: 15.83 ng/mL — ABNORMAL LOW (ref 30–100)

## 2021-06-29 LAB — COMPREHENSIVE METABOLIC PANEL
ALT: 26 U/L (ref 0–44)
AST: 23 U/L (ref 15–41)
Albumin: 3.3 g/dL — ABNORMAL LOW (ref 3.5–5.0)
Alkaline Phosphatase: 64 U/L (ref 38–126)
Anion gap: 7 (ref 5–15)
BUN: 8 mg/dL (ref 6–20)
CO2: 24 mmol/L (ref 22–32)
Calcium: 8.9 mg/dL (ref 8.9–10.3)
Chloride: 105 mmol/L (ref 98–111)
Creatinine, Ser: 0.7 mg/dL (ref 0.44–1.00)
GFR, Estimated: >60 mL/min (ref >60)
Glucose, Bld: 101 mg/dL — ABNORMAL HIGH (ref 70–99)
Potassium: 3.6 mmol/L (ref 3.5–5.1)
Sodium: 136 mmol/L (ref 135–145)
Total Bilirubin: 0.5 mg/dL (ref 0.3–1.2)
Total Protein: 6.8 g/dL (ref 6.5–8.1)

## 2021-06-29 MED ORDER — WHITE PETROLATUM EX OINT
TOPICAL_OINTMENT | CUTANEOUS | Status: AC
  Filled 2021-06-29: qty 28.35

## 2021-06-29 MED ORDER — TOPIRAMATE 25 MG PO TABS
25.0000 mg | ORAL_TABLET | Freq: Every day | ORAL | Status: DC
  Administered 2021-06-29 – 2021-06-30 (×2): 25 mg via ORAL
  Filled 2021-06-29 (×2): qty 1

## 2021-06-29 NOTE — Progress Notes (Signed)
PROGRESS NOTE   Subjective/Complaints:  Pt reports wants to go home soon, but does understand she's not ready.  LBM last night and needs to go again today- soon.  Has HA daily since stroke- 5-7/10- every day at least 1x/day.  BP 141/99- also is light sensitive from it.     ROS:  Pt denies SOB, abd pain, CP, N/V/C/D, and vision changes   Objective:   No results found. Recent Labs    06/27/21 0414 06/29/21 0454  WBC 6.5 7.2  HGB 11.5* 11.2*  HCT 35.5* 33.4*  PLT 441* 401*   Recent Labs    06/29/21 0454  NA 136  K 3.6  CL 105  CO2 24  GLUCOSE 101*  BUN 8  CREATININE 0.70  CALCIUM 8.9    Intake/Output Summary (Last 24 hours) at 06/29/2021 1651 Last data filed at 06/29/2021 1249 Gross per 24 hour  Intake 480 ml  Output --  Net 480 ml        Physical Exam: Vital Signs Blood pressure 127/76, pulse 76, temperature 98.2 F (36.8 C), temperature source Oral, resp. rate 16, weight (!) 143.4 kg, last menstrual period 06/11/2021, SpO2 100 %.     General: awake, alert, appropriate, sitting up EOB with PT in room; BMI 45; NAD HENT: conjugate gaze; oropharynx moist CV: regular rate; no JVD Pulmonary: CTA B/L; no W/R/R- good air movement GI: soft, NT, ND, (+)BS Psychiatric: appropriate; interactive Neurological: Ox3; c/o HA  Musculoskeletal:        General: No swelling or tenderness. Normal range of motion.     Cervical back: Normal range of motion and neck supple.     Right lower leg: No edema.     Left lower leg: No edema.  Skin:    General: Skin is warm and dry.  Neurological:     Mental Status: She is alert.     Comments: Alert and oriented x 3. Normal insight and awareness. Intact Memory. Normal language and speech. Cranial nerve exam unremarkable except for left central 7. RUE and RLE 5/5. LUE 4/5 prox to 3/5 distally at wrist, HI. LLE 2 to 2+/5 prox to 1+ to 2/5 ADF/PF. Mild loss of LT/PP in left  foot, less moving proximally. Otherwise no sensory deficits. No resting tone, DTR's 1+ to 2+.    Assessment/Plan: 1. Functional deficits which require 3+ hours per day of interdisciplinary therapy in a comprehensive inpatient rehab setting. Physiatrist is providing close team supervision and 24 hour management of active medical problems listed below. Physiatrist and rehab team continue to assess barriers to discharge/monitor patient progress toward functional and medical goals  Care Tool:  Bathing    Body parts bathed by patient: Right arm, Right lower leg, Left lower leg, Left arm, Chest, Face, Abdomen, Front perineal area, Buttocks, Right upper leg, Left upper leg         Bathing assist Assist Level: Supervision/Verbal cueing     Upper Body Dressing/Undressing Upper body dressing        Upper body assist Assist Level: Set up assist    Lower Body Dressing/Undressing Lower body dressing      What is the patient wearing?:  Underwear/pull up, Pants     Lower body assist Assist for lower body dressing: Set up assist     Toileting Toileting    Toileting assist Assist for toileting: Supervision/Verbal cueing     Transfers Chair/bed transfer  Transfers assist     Chair/bed transfer assist level: Supervision/Verbal cueing     Locomotion Ambulation   Ambulation assist   Ambulation activity did not occur: Safety/medical concerns  Assist level: Contact Guard/Touching assist Assistive device: No Device Max distance: 500   Walk 10 feet activity   Assist     Assist level: Supervision/Verbal cueing Assistive device: No Device   Walk 50 feet activity   Assist    Assist level: Supervision/Verbal cueing Assistive device: No Device    Walk 150 feet activity   Assist    Assist level: Contact Guard/Touching assist Assistive device: No Device    Walk 10 feet on uneven surface  activity   Assist     Assist level: Contact Guard/Touching  assist Assistive device: Other (comment) (none)   Wheelchair     Assist Is the patient using a wheelchair?: No             Wheelchair 50 feet with 2 turns activity    Assist            Wheelchair 150 feet activity     Assist          Blood pressure 127/76, pulse 76, temperature 98.2 F (36.8 C), temperature source Oral, resp. rate 16, weight (!) 143.4 kg, last menstrual period 06/11/2021, SpO2 100 %.  Medical Problem List and Plan: 1.  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  Antithrombotics: -DVT/anticoagulation:  Pharmaceutical: Lovenox             -antiplatelet therapy: DAPT X 17 more days followed by ASA alone 3. Headaches/Pain Management: tylenol prn 4. Mood: LCSW to follow for evaluation and suppor.t d             -antipsychotic agents: N/A 5. Neuropsych: This patient is capable of making decisions on her own behalf. 6. Skin/Wound Care: Routine pressure relief measures.  7. Fluids/Electrolytes/Nutrition: Monitor I/O. Check lytes in am.  8. Pre-diabetes: Hgb A1c-5.9. Will add CM restrictions.  9. Vitamin B 12 deficiency: B12 level- 141   10. Dyslipidemia: LDL 120-->now on lipitor.  11. Morbid obesity s/p gastric sleeve: Will check Vitamin B1 level, Mg and Vit D to rule out deficiency --start multivitamin.  -needs further education on diet to help reduce stroke risk and manage weight 12. Slow transit constipation:             -senna-s 2 tablets bid             -miralax daily             -sorbitol prn   8/27- LBM last night and again today 13. Daily Headaches since stroke  8/27- will start Topamax 25 mg QHS- explained to pt is weight neutral vs weight loss- she agreed to med. Is started  LOS: 1 days A FACE TO FACE EVALUATION WAS PERFORMED  Danielle Kelley 06/29/2021, 4:51 PM

## 2021-06-29 NOTE — Evaluation (Signed)
Occupational Therapy Assessment and Plan  Patient Details  Name: Danielle Kelley MRN: [id number] Date of Birth: 02-18-1981  OT Diagnosis: cognitive deficits, hemiplegia affecting non-dominant side, and muscle weakness (generalized) Rehab Potential: Rehab Potential (ACUTE ONLY): Excellent ELOS: 3-5 days   Today's Date: 06/29/2021 OT Individual Time: 0903-1003 OT Individual Time Calculation (min): 60 min     Hospital Problem: Principal Problem:   Acute CVA (cerebrovascular accident) ([place]) Active Problems:   Stroke (cerebrum) ([place])   Past Medical History:  Past Medical History:  Diagnosis Date   Obesity    Seasonal allergies    Past Surgical History:  Past Surgical History:  Procedure Laterality Date   ABDOMINAL SURGERY  04/06/2017   Gastric sleeve   BUBBLE STUDY N/A 06/26/2021   Procedure: BUBBLE STUDY;  Surgeon: Buford Dresser, MD;  Location: [place];  Service: Cardiovascular;  Laterality: N/A;   CESAREAN SECTION     2004, 2006, 2007.   TEE WITHOUT CARDIOVERSION N/A 06/26/2021   Procedure: TRANSESOPHAGEAL ECHOCARDIOGRAM (TEE);  Surgeon: Buford Dresser, MD;  Location[facility] ENDOSCOPY;  Service: Cardiovascular;  Laterality: N/A;    Assessment & Plan Clinical Impression: Danielle Kelley is a 40 year old female with history of morbid obesity s/p sleein relatively good health who was admitted on 06/24/21 with reports of left sided numbness with weakness, heaviness and difficulty walking. UDS  negative. MRI brain done revealing small acute infarct in right posterior frontal corona radiata and MRA negative for LVO/stenosis. TEE done revealing EF 60-65% with no wall abnormality and negative for shunting, PFO or thrombus. Dr. Barrington Ellison that stroke likely due to small vessel disease but atypical and recommended DAPT X 3 weeks followed by ASA alone. Coagulopathy panel pending. She continues to be limited by L-sided weakness with numbness, left knee instability with fatigue and  balance deficits.  Patient transferred to CIR on 06/28/2021 .    Patient currently requires supervision with basic self-care skills secondary to muscle weakness, decreased cardiorespiratoy endurance, decreased coordination and decreased motor planning, decreased attention, and decreased sitting balance, decreased standing balance, hemiplegia, and decreased balance strategies.  Prior to hospitalization, patient could complete all ADLs and IADLs with independent .  Patient will benefit from skilled intervention to increase independence with basic self-care skills prior to discharge home independently.  Anticipate patient will require  follow up outpatient.  OT - End of Session Activity Tolerance: Tolerates 30+ min activity with multiple rests Endurance Deficit: Yes Endurance Deficit Description: Reports feeling more fatigued during basic self care tasks; benefits from intermittent seated rest breaks of min duration OT Assessment Rehab Potential (ACUTE ONLY): Excellent OT Patient demonstrates impairments in the following area(s): Balance;Cognition;Endurance;Motor;Pain OT Basic ADL's Functional Problem(s): Bathing;Dressing;Toileting OT Advanced ADL's Functional Problem(s): Simple Meal Preparation;Laundry;Light Housekeeping OT Transfers Functional Problem(s): Toilet;Tub/Shower OT Additional Impairment(s): Fuctional Use of Upper Extremity OT Plan OT Intensity: Minimum of 1-2 x/day, 45 to 90 minutes OT Frequency: 5 out of 7 days OT Duration/Estimated Length of Stay: 3-5 days OT Treatment/Interventions: Balance/vestibular training;Discharge planning;Pain management;Self Care/advanced ADL retraining;Therapeutic Activities;UE/LE Coordination activities;Cognitive remediation/compensation;Disease mangement/prevention;Functional mobility training;Patient/family education;Therapeutic Exercise;DME/adaptive equipment instruction;Neuromuscular re-education;Community reintegration;Psychosocial support;UE/LE  Strength taining/ROM OT Self Feeding Anticipated Outcome(s): independent OT Basic Self-Care Anticipated Outcome(s): mod I OT Toileting Anticipated Outcome(s): mod I OT Bathroom Transfers Anticipated Outcome(s): mod I OT Recommendation Patient destination: Home Follow Up Recommendations: Outpatient OT Equipment Recommended: To be determined Equipment Details: has no DME currently   OT Evaluation Precautions/Restrictions  Precautions Precautions: Fall Restrictions Weight Bearing Restrictions: No General Chart Reviewed:  Yes Pain Pain Assessment Pain Scale: 0-10 Pain Score: 5  Pain Type: Acute pain Pain Location: Head Pain Orientation: Mid Pain Descriptors / Indicators: Aching Pain Frequency: Intermittent Pain Onset: Gradual Patients Stated Pain Goal: 0 Pain Intervention(s): RN made aware Multiple Pain Sites: No Home Living/Prior Functioning Home Living Family/patient expects to be discharged to:: Private residence Living Arrangements: Parent, Children, Other (Comment) (Mother) Available Help at Discharge: Family, Available 24 hours/day Type of Home: House Home Access: Stairs to enter [employer] of Steps: 4 Entrance Stairs-Rails: Right, Left, Can reach both Home Layout: Two level Alternate Level Stairs-Number of Steps: flight Alternate Level Stairs-Rails: Left Bathroom Shower/Tub: Multimedia programmer: Standard Additional Comments: pt's mom who had a CVA, lives with her and is independent with self care but does not drive. 2 teenagers at home as well.  Lives With: Family IADL History Education: Dietitian in Business, works doing HR for [employer] Prior Function Level of Independence: Independent with gait, Independent with homemaking with ambulation, Independent with transfers, Independent with basic ADLs  Able to Take Stairs?: Yes Driving: Yes Vocation: Full time employment Vocation Requirements: Programmer, applications, Engineer, building services Comments: works in [employer] for [employer], can work from home Vision Baseline Vision/History: 1 Wears glasses Ability to See in Adequate Light: 0 Adequate Patient Visual Report: No change from baseline Vision Assessment?: No apparent visual deficits Perception  Perception: Within Functional Limits Praxis Praxis: Intact Cognition Overall Cognitive Status: Impaired/Different from baseline Arousal/Alertness: Awake/alert Orientation Level: Person;Place;Situation Person: Oriented Place: Oriented Situation: Oriented Year: 2022 Month: August Day of Week: Correct Memory: Appears intact Immediate Memory Recall: Sock;Blue;Bed Memory Recall Sock: Without Cue Memory Recall Blue: Without Cue Memory Recall Bed: Without Cue Attention: Sustained;Selective;Alternating;Divided Sustained Attention: Appears intact Selective Attention: Appears intact Divided Attention: Impaired Divided Attention Impairment: Functional complex;Verbal complex;Functional basic Awareness: Appears intact Problem Solving: Appears intact Executive Function: Initiating;Decision Making;Organizing;Sequencing Sequencing: Appears intact Organizing: Appears intact Decision Making: Appears intact Initiating: Appears intact Safety/Judgment: Appears intact Sensation Sensation Light Touch: Appears Intact Hot/Cold: Appears Intact Proprioception: Appears Intact Stereognosis: Not tested Coordination Gross Motor Movements are Fluid and Coordinated: No Finger Nose Finger Test: intact Heel Shin Test: slowed L vs R Motor  Motor Motor: Hemiplegia Motor - Skilled Clinical Observations: mild proximal weakness  Trunk/Postural Assessment  Cervical Assessment Cervical Assessment: Within Functional Limits Thoracic Assessment Thoracic Assessment: Within Functional Limits Lumbar Assessment Lumbar Assessment: Within Functional Limits Postural Control Postural Control: Within Functional Limits  Balance Balance Balance  Assessed: Yes Standardized Balance Assessment Standardized Balance Assessment: Berg Balance Test Berg Balance Test Sit to Stand: Able to stand without using hands and stabilize independently Standing Unsupported: Able to stand safely 2 minutes Sitting with Back Unsupported but Feet Supported on Floor or Stool: Able to sit safely and securely 2 minutes Stand to Sit: Sits safely with minimal use of hands Transfers: Able to transfer safely, minor use of hands Standing Unsupported with Eyes Closed: Able to stand 10 seconds safely Standing Ubsupported with Feet Together: Able to place feet together independently and stand for 1 minute with supervision From Standing, Reach Forward with Outstretched Arm: Can reach confidently >25 cm (10") From Standing Position, Pick up Object from Floor: Able to pick up shoe, needs supervision From Standing Position, Turn to Look Behind Over each Shoulder: Looks behind from both sides and weight shifts well Turn 360 Degrees: Able to turn 360 degrees safely but slowly Standing Unsupported, Alternately Place Feet on Step/Stool: Able to stand independently and complete 8 steps >20  seconds Standing Unsupported, One Foot in Front: Able to take small step independently and hold 30 seconds Standing on One Leg: Able to lift leg independently and hold 5-10 seconds Total Score: 48 Static Sitting Balance Static Sitting - Balance Support: No upper extremity supported;Feet supported Static Sitting - Level of Assistance: 7: Independent Dynamic Sitting Balance Dynamic Sitting - Balance Support: No upper extremity supported;During functional activity;Feet supported Dynamic Sitting - Level of Assistance: 7: Independent Static Standing Balance Static Standing - Balance Support: No upper extremity supported Static Standing - Level of Assistance: 5: Stand by assistance Dynamic Standing Balance Dynamic Standing - Balance Support: No upper extremity supported;During functional  activity Dynamic Standing - Level of Assistance: 5: Stand by assistance Extremity/Trunk Assessment RUE Assessment RUE Assessment: Within Functional Limits LUE Assessment LUE Assessment: Exceptions to [facility] Passive Range of Motion (PROM) Comments: WNL Active Range of Motion (AROM) Comments: WNL General Strength Comments: 4-/5 globally  Care Tool Care Tool Self Care Eating   Eating Assist Level: Independent    Oral Care    Oral Care Assist Level: Set up assist    Bathing   Body parts bathed by patient: Right arm;Right lower leg;Left lower leg;Left arm;Chest;Face;Abdomen;Front perineal area;Buttocks;Right upper leg;Left upper leg     Assist Level: Supervision/Verbal cueing    Upper Body Dressing(including orthotics)       Assist Level: Set up assist    Lower Body Dressing (excluding footwear)   What is the patient wearing?: Underwear/pull up;Pants Assist for lower body dressing: Set up assist    Putting on/Taking off footwear   What is the patient wearing?: Socks;Shoes Assist for footwear: Set up assist       Care Tool Toileting Toileting activity   Assist for toileting: Supervision/Verbal cueing     Care Tool Bed Mobility Roll left and right activity   Roll left and right assist level: Independent    Sit to lying activity   Sit to lying assist level: Independent    Lying to sitting on side of bed activity   Lying to sitting on side of bed assist level: the ability to move from lying on the back to sitting on the side of the bed with no back support.: Independent     Care Tool Transfers Sit to stand transfer   Sit to stand assist level: Supervision/Verbal cueing    Chair/bed transfer   Chair/bed transfer assist level: Supervision/Verbal cueing     Toilet transfer   Assist Level: Supervision/Verbal cueing     Care Tool Cognition  Expression of Ideas and Wants Expression of Ideas and Wants: 4. Without difficulty (complex and basic) - expresses complex  messages without difficulty and with speech that is clear and easy to understand  Understanding Verbal and Non-Verbal Content Understanding Verbal and Non-Verbal Content: 4. Understands (complex and basic) - clear comprehension without cues or repetitions   Memory/Recall Ability Memory/Recall Ability : Current season;Location of own room;Staff names and faces;That he or she is in a hospital/hospital unit   Refer to Care Plan for [place] 1 OT Short Term Goal 1 (Week 1): STGs=LTGs due to ELOS  Recommendations for other services: None    Skilled Therapeutic Intervention ADL ADL Grooming: Independent Where Assessed-Grooming: Standing at sink Upper Body Bathing: Supervision/safety Where Assessed-Upper Body Bathing: Shower Lower Body Bathing: Supervision/safety Where Assessed-Lower Body Bathing: Shower Upper Body Dressing: Independent Where Assessed-Upper Body Dressing: Chair Lower Body Dressing: Supervision/safety Where Assessed-Lower Body Dressing: Chair Toileting:  Supervision/safety Toilet Transfer: Close supervision Toilet Transfer Method: Counselling psychologist: Energy manager: Close supervision Social research officer, government Method: Heritage manager: Grab bars Mobility  Bed Mobility Bed Mobility: Rolling Right;Rolling Left;Supine to Sit;Sit to Supine Rolling Right: Independent Rolling Left: Independent Supine to Sit: Independent Sit to Supine: Independent Transfers Sit to Stand: Supervision/Verbal cueing Stand to Sit: Supervision/Verbal cueing  Skilled Intervention: Pt semi reclined in bed, c/o 5/10 pain.  Nurse notified.  Vitals assessed: BP 141/99; pulse and O2 WNL. MD made aware of pts current BP at rest.  Pt requesting to shower and brush hair.  Initial evaluation complete per above.  Self care also completed per above levels of assist.  Pt educated weight shifting when standing to prevent  compensating by shifting onto stronger RLE.  Pt able to demonstrate good follow through of correction.  Pt sitting up in recliner at end of session, call bell in reach, seat alarm on.   Discharge Criteria: Patient will be discharged from OT if patient refuses treatment 3 consecutive times without medical reason, if treatment goals not met, if there is a change in medical status, if patient makes no progress towards goals or if patient is discharged from hospital.  The above assessment, treatment plan, treatment alternatives and goals were discussed and mutually agreed upon: by patient  Ezekiel Slocumb 06/29/2021, 12:50 PM

## 2021-06-29 NOTE — Evaluation (Signed)
Physical Therapy Assessment and Plan  Patient Details  Name: Danielle Kelley MRN: [id number] Date of Birth: 1980/12/15  PT Diagnosis: Abnormality of gait Rehab Potential: Good ELOS: 5-7 days   Today's Date: 06/29/2021 PT Individual Time: 1105-1205 and 1300-1330 PT Individual Time Calculation (min): 60 min and 30 min  Hospital Problem: Principal Problem:   Acute CVA (cerebrovascular accident) [facility]) Active Problems:   Stroke (cerebrum) ([place])   Past Medical History:  Past Medical History:  Diagnosis Date   Obesity    Seasonal allergies    Past Surgical History:  Past Surgical History:  Procedure Laterality Date   ABDOMINAL SURGERY  04/06/2017   Gastric sleeve   BUBBLE STUDY N/A 06/26/2021   Procedure: BUBBLE STUDY;  Surgeon: Buford Dresser, MD;  Location: [facility] ENDOSCOPY;  Service: Cardiovascular;  Laterality: N/A;   CESAREAN SECTION     2004, 2006, 2007.   TEE WITHOUT CARDIOVERSION N/A 06/26/2021   Procedure: TRANSESOPHAGEAL ECHOCARDIOGRAM (TEE);  Surgeon: Buford Dresser, MD;  Location: [facility] ENDOSCOPY;  Service: Cardiovascular;  Laterality: N/A;    Assessment & Plan Clinical Impression:   Danielle Kelley is a 40 year old female with history of morbid obesity s/p sleein relatively good health who was admitted on 06/24/21 with reports of left sided numbness with weakness, heaviness and difficulty walking. UDS  negative. MRI brain done revealing small acute infarct in right posterior frontal corona radiata and MRA negative for LVO/stenosis. TEE done revealing EF 60-65% with no wall abnormality and negative for shunting, PFO or thrombus. Dr. Barrington Ellison that stroke likely due to small vessel disease but atypical and recommended DAPT X 3 weeks followed by ASA alone. Coagulopathy panel pending. She continues to be limited by L-sided weakness with numbness, left knee instability with fatigue and balance deficits. CIR recommended due to functional declinePatient transferred to CIR on  06/28/2021 .   Patient currently requires contact guard assist to supervision with mobility secondary to muscle weakness, decreased cardiorespiratoy endurance, and decreased coordination and proximal weakness .  Prior to hospitalization, patient was independent  with mobility and lived with Family in a House (2story) home.  Home access is 4Stairs to enter. Pt w/BERG = 48/56 at evaluation indicating moderate increased risk of falls Patient will benefit from skilled PT intervention to maximize safe functional mobility, minimize fall risk, and decrease caregiver burden for planned discharge home with 24 hour supervision.  Anticipate patient will  possibly benefit from OPT  at discharge.  PT - End of Session Activity Tolerance: Tolerates 30+ min activity with multiple rests PT Assessment Rehab Potential (ACUTE/IP ONLY): Good PT Patient demonstrates impairments in the following area(s): Balance;Motor PT Transfers Functional Problem(s): Bed to Chair;Car;Furniture PT Locomotion Functional Problem(s): Ambulation;Stairs PT Plan PT Intensity: Minimum of 1-2 x/day ,45 to 90 minutes PT Frequency: 5 out of 7 days PT Duration Estimated Length of Stay: 3-5days PT Treatment/Interventions: Ambulation/gait training;Neuromuscular re-education;Stair training;Balance/vestibular training;Discharge planning;Therapeutic Activities;UE/LE Coordination activities;Functional mobility training;Therapeutic Exercise;Patient/family education PT Transfers Anticipated Outcome(s): independent PT Locomotion Anticipated Outcome(s): independent PT Recommendation Follow Up Recommendations: None Patient destination: Home Equipment Recommended: To be determined   PT Evaluation Precautions/Restrictions Precautions Precautions: Fall Restrictions Weight Bearing Restrictions: No General   Vital Signs  Pain Pain Assessment Pain Scale: 0-10 Pain Score: 0-No pain Pain Type: Acute pain Pain Location: Eye Pain Orientation:  Left Pain Descriptors / Indicators: Aching Pain Frequency: Intermittent Pain Onset: Gradual Patients Stated Pain Goal: 0 Pain Intervention(s): Medication (See eMAR) Pain Interference Pain Interference Pain Effect on Sleep: 0. Does  not apply - I have not had any pain or hurting in the past 5 days Pain Interference with Therapy Activities: 0. Does not apply - I have not received rehabilitationtherapy in the past 5 days Pain Interference with Day-to-Day Activities: 1. Rarely or not at all Home Living/Prior Gross Available Help at Discharge: Family;Available 24 hours/day (mother, 2 teen age children) Type of Home: House (2story) Home Access: Stairs to enter Technical brewer of Steps: 4 Entrance Stairs-Rails: Right;Left;Can reach both Home Layout: Two level Alternate Level Stairs-Number of Steps: flight Alternate Level Stairs-Rails: Left Bathroom Shower/Tub: Multimedia programmer: Standard Additional Comments: pt's mom who had a CVA, lives with her and is independent with self care but does not drive. 2 teenagers at home as well.  Lives With: Family Prior Function Level of Independence: Independent with gait;Independent with homemaking with ambulation;Independent with transfers;Independent with basic ADLs  Able to Take Stairs?: Yes Driving: Yes Vocation: Full time employment Vocation Requirements: Programmer, applications, Librarian, academic Comments: works in [employer] for [employer], can work from home Vision/Perception  Vision - History Ability to See in Adequate Light: 0 Adequate  Cognition Overall Cognitive Status: Impaired/Different from baseline Arousal/Alertness: Awake/alert Orientation Level: Oriented X4 Sensation Sensation Light Touch: Appears Intact Proprioception: Appears Intact Coordination Gross Motor Movements are Fluid and Coordinated: No Finger Nose Finger Test: intact Heel Shin Test: slowed L vs R Motor  Motor Motor: Hemiplegia Motor - Skilled  Clinical Observations: mild proximal weakness   Trunk/Postural Assessment  Cervical Assessment Cervical Assessment: Within Functional Limits Thoracic Assessment Thoracic Assessment: Within Functional Limits Lumbar Assessment Lumbar Assessment: Within Functional Limits Postural Control Postural Control: Deficits on evaluation (premorbid trunk control deficits related to morbid obesity)  Balance Standardized Balance Assessment Standardized Balance Assessment: Berg Balance Test Berg Balance Test Sit to Stand: Able to stand without using hands and stabilize independently Standing Unsupported: Able to stand safely 2 minutes Sitting with Back Unsupported but Feet Supported on Floor or Stool: Able to sit safely and securely 2 minutes Stand to Sit: Sits safely with minimal use of hands Transfers: Able to transfer safely, minor use of hands Standing Unsupported with Eyes Closed: Able to stand 10 seconds safely Standing Ubsupported with Feet Together: Able to place feet together independently and stand for 1 minute with supervision From Standing, Reach Forward with Outstretched Arm: Can reach confidently >25 cm (10") From Standing Position, Pick up Object from Floor: Able to pick up shoe, needs supervision From Standing Position, Turn to Look Behind Over each Shoulder: Looks behind from both sides and weight shifts well Turn 360 Degrees: Able to turn 360 degrees safely but slowly Standing Unsupported, Alternately Place Feet on Step/Stool: Able to stand independently and complete 8 steps >20 seconds Standing Unsupported, One Foot in Front: Able to take small step independently and hold 30 seconds Standing on One Leg: Able to lift leg independently and hold 5-10 seconds Total Score: 48 Extremity Assessment     See OT eval for full UE assessment, mild L proximal weakness noted.  RLE Assessment RLE Assessment: Within Functional Limits LLE Assessment LLE Assessment: Exceptions to [facility] LLE  Strength LLE Overall Strength: Deficits LLE Overall Strength Comments: proximal weakness Left Hip Flexion: 3-/5 Left Hip ABduction: 3-/5 Left Hip ADduction: 3-/5 Left Knee Flexion: 4+/5 Left Knee Extension: 5/5 Left Ankle Dorsiflexion: 5/5 Left Ankle Plantar Flexion: 5/5 Left Ankle Inversion: 5/5 Left Ankle Eversion: 5/5  Care Tool Care Tool Bed Mobility Roll left and right activity   Roll left  and right assist level: Independent    Sit to lying activity   Sit to lying assist level: Independent    Lying to sitting on side of bed activity   Lying to sitting on side of bed assist level: the ability to move from lying on the back to sitting on the side of the bed with no back support.: Independent     Care Tool Transfers Sit to stand transfer   Sit to stand assist level: Supervision/Verbal cueing    Chair/bed transfer   Chair/bed transfer assist level: Supervision/Verbal cueing     Physiological scientist transfer assist level: Supervision/Verbal cueing      Care Tool Locomotion Ambulation Ambulation activity did not occur: Safety/medical concerns Assist level: Contact Guard/Touching assist Assistive device: No Device Max distance: 500  Walk 10 feet activity   Assist level: Supervision/Verbal cueing Assistive device: No Device   Walk 50 feet with 2 turns activity   Assist level: Supervision/Verbal cueing Assistive device: No Device  Walk 150 feet activity   Assist level: Contact Guard/Touching assist Assistive device: No Device  Walk 10 feet on uneven surfaces activity   Assist level: Contact Guard/Touching assist Assistive device: Other (comment) (none)  Stairs   Assist level: Supervision/Verbal cueing Stairs assistive device: 2 hand rails Max number of stairs: 12  Walk up/down 1 step activity   Walk up/down 1 step (curb) assist level: Contact Guard/Touching assist Walk up/down 1 step or curb assistive device: No device    Walk up/down  4 steps activity Walk up/down 4 steps assist level: Supervision/Verbal cueing Walk up/down 4 steps assistive device: 2 hand rails  Walk up/down 12 steps activity   Walk up/down 12 steps assist level: Supervision/Verbal cueing Walk up/down 12 steps assistive device: 2 hand rails  Pick up small objects from floor   Pick up small object from the floor assist level: Contact Guard/Touching assist Pick up small object from the floor assistive device: none  Wheelchair Is the patient using a wheelchair?: No          Wheel 50 feet with 2 turns activity      Wheel 150 feet activity        Refer to Care Plan for [address] 1 PT Short Term Goal 1 (Week 1): STGs=LTGs  Recommendations for other services: None   Skilled Therapeutic Intervention  valuation completed (see details above and below) with education on PT POC and goals and individual treatment initiated with focus on functional mobility/transfers, LE strength, dynamic standing balance/coordination, ambulation, stair navigation, simulated car transfers, and improved endurance with activity Pt was oriented to rehab unit.  Discussed process of daily scheduling and receiving schedule, purpose of weekly team conference and schedule and D/c planning.  Pt provided w/ wheelchair and cushion and adjustments made to promote optimal seating posture and pressure distribution.  Wc provided primarily per pt request for family visit 8/28.  Treatment initialted w/focus on dynamic balance, gait endurance and sfaety, and LLE functional strengthening.       PM session Denies pain Gait x 150w/supervision to gym, transfers to supine on mat w/supervision.  Therex: Supine -  Bridges 2x10 Hip abd w/green theraband resistance 2x10 Briging + hip abd 2 x10 Bridge w/feet on theraball w/5 sec holds SLR 2x10 w/LLE Hip and knee flexion/ext 2x15  Standing reverse lunges x10, lateral lunges x 10  Gait 166ft w/supervision,  turn/sit  to recliner w/supervision.  Pt left oob in recliner w/chair alarm set and needs in reach.  Mobility Bed Mobility Bed Mobility: Rolling Right;Rolling Left;Supine to Sit;Sit to Supine Rolling Right: Independent Rolling Left: Independent Supine to Sit: Independent Sit to Supine: Independent Transfers Transfers: Sit to Stand;Stand to Sit Sit to Stand: Supervision/Verbal cueing Stand to Sit: Supervision/Verbal cueing Transfer (Assistive device): None Locomotion  Gait Ambulation: Yes Gait Assistance: Supervision/Verbal cueing Gait Distance (Feet): 500 Feet Assistive device: None Gait Assistance Details: close supervision, cga w/turning Gait Gait: Yes Gait Pattern:  (mild wobbles L knee, increased lateral trunk excursions due to body habitus) Gait velocity: decreased Stairs / Additional Locomotion Stairs: Yes Stairs Assistance: Supervision/Verbal cueing Stair Management Technique: Two rails Number of Stairs: 12 Height of Stairs: 6 Ramp: Supervision/Verbal cueing Curb: Supervision/Verbal cueing Wheelchair Mobility Wheelchair Mobility: No   Discharge Criteria: Patient will be discharged from PT if patient refuses treatment 3 consecutive times without medical reason, if treatment goals not met, if there is a change in medical status, if patient makes no progress towards goals or if patient is discharged from hospital.  The above assessment, treatment plan, treatment alternatives and goals were discussed and mutually agreed upon: by patient  Jerrilyn Cairo 06/29/2021, 12:24 PM

## 2021-06-30 DIAGNOSIS — I639 Cerebral infarction, unspecified: Secondary | ICD-10-CM | POA: Diagnosis not present

## 2021-06-30 NOTE — Progress Notes (Signed)
PROGRESS NOTE   Subjective/Complaints:  Pt's birthday today- asking for grounds pass to see her teenage children.  Also had decreased appetite this AM after topamax, but no other side effects- she was OK with appetite side effect.  Also fell asleep pretty fast after topamax last night.  Notes to therapy that she feels like cognition isn't quite like herself- asked for SLP- ordered.     ROS:  Pt denies SOB, abd pain, CP, N/V/C/D, and vision changes   Objective:   No results found. Recent Labs    06/29/21 0454  WBC 7.2  HGB 11.2*  HCT 33.4*  PLT 401*   Recent Labs    06/29/21 0454  NA 136  K 3.6  CL 105  CO2 24  GLUCOSE 101*  BUN 8  CREATININE 0.70  CALCIUM 8.9    Intake/Output Summary (Last 24 hours) at 06/30/2021 1550 Last data filed at 06/30/2021 0915 Gross per 24 hour  Intake 476 ml  Output --  Net 476 ml        Physical Exam: Vital Signs Blood pressure (!) 144/96, pulse 67, temperature 98.2 F (36.8 C), temperature source Oral, resp. rate 18, weight (!) 143.3 kg, last menstrual period 06/11/2021, SpO2 100 %.       General: awake, alert, appropriate, sitting up in bedside chair; bright/laughing some; NAD HENT: conjugate gaze; oropharynx moist CV: regular rate; no JVD Pulmonary: CTA B/L; no W/R/R- good air movement GI: soft, NT, ND, (+)BS Psychiatric: appropriate; bright Neurological: Ox3 However pt c/o higher level cognitive issues  Musculoskeletal:        General: No swelling or tenderness. Normal range of motion.     Cervical back: Normal range of motion and neck supple.     Right lower leg: No edema.     Left lower leg: No edema.  Skin:    General: Skin is warm and dry.  Neurological:     Mental Status: She is alert.     Comments: Alert and oriented x 3. Normal insight and awareness. Intact Memory. Normal language and speech. Cranial nerve exam unremarkable except for left  central 7. RUE and RLE 5/5. LUE 4/5 prox to 3/5 distally at wrist, HI. LLE 2 to 2+/5 prox to 1+ to 2/5 ADF/PF. Mild loss of LT/PP in left foot, less moving proximally. Otherwise no sensory deficits. No resting tone, DTR's 1+ to 2+.    Assessment/Plan: 1. Functional deficits which require 3+ hours per day of interdisciplinary therapy in a comprehensive inpatient rehab setting. Physiatrist is providing close team supervision and 24 hour management of active medical problems listed below. Physiatrist and rehab team continue to assess barriers to discharge/monitor patient progress toward functional and medical goals  Care Tool:  Bathing    Body parts bathed by patient: Right arm, Right lower leg, Left lower leg, Left arm, Chest, Face, Abdomen, Front perineal area, Buttocks, Right upper leg, Left upper leg         Bathing assist Assist Level: Supervision/Verbal cueing     Upper Body Dressing/Undressing Upper body dressing   What is the patient wearing?: Hospital gown only    Upper body assist Assist Level: Set  up assist    Lower Body Dressing/Undressing Lower body dressing      What is the patient wearing?: Underwear/pull up     Lower body assist Assist for lower body dressing: Set up assist     Toileting Toileting    Toileting assist Assist for toileting: Contact Guard/Touching assist     Transfers Chair/bed transfer  Transfers assist     Chair/bed transfer assist level: Supervision/Verbal cueing     Locomotion Ambulation   Ambulation assist   Ambulation activity did not occur: Safety/medical concerns  Assist level: Contact Guard/Touching assist Assistive device: No Device Max distance: 500   Walk 10 feet activity   Assist     Assist level: Supervision/Verbal cueing Assistive device: No Device   Walk 50 feet activity   Assist    Assist level: Supervision/Verbal cueing Assistive device: No Device    Walk 150 feet activity   Assist     Assist level: Contact Guard/Touching assist Assistive device: No Device    Walk 10 feet on uneven surface  activity   Assist     Assist level: Contact Guard/Touching assist Assistive device: Other (comment) (none)   Wheelchair     Assist Is the patient using a wheelchair?: No             Wheelchair 50 feet with 2 turns activity    Assist            Wheelchair 150 feet activity     Assist          Blood pressure (!) 144/96, pulse 67, temperature 98.2 F (36.8 C), temperature source Oral, resp. rate 18, weight (!) 143.3 kg, last menstrual period 06/11/2021, SpO2 100 %.  Medical Problem List and Plan: 1.  Left hemiparesis secondary to right posterior frontal corona radiata infarct             -patient may shower             -ELOS/Goals: 7-10 days, Mod I goals for PT and OT             con't PT and OT_ and add SLP for pt.  2.  Antithrombotics: -DVT/anticoagulation:  Pharmaceutical: Lovenox             -antiplatelet therapy: DAPT X 17 more days followed by ASA alone 3. Headaches/Pain Management: tylenol prn 4. Mood: LCSW to follow for evaluation and suppor.t d             -antipsychotic agents: N/A 5. Neuropsych: This patient is capable of making decisions on her own behalf. 6. Skin/Wound Care: Routine pressure relief measures.  7. Fluids/Electrolytes/Nutrition: Monitor I/O. Check lytes in am.  8. Pre-diabetes: Hgb A1c-5.9. Will add CM restrictions.  9. Vitamin B 12 deficiency: B12 level- 141   10. Dyslipidemia: LDL 120-->now on lipitor.  11. Morbid obesity s/p gastric sleeve: Will check Vitamin B1 level, Mg and Vit D to rule out deficiency --start multivitamin.  -needs further education on diet to help reduce stroke risk and manage weight 12. Slow transit constipation:             -senna-s 2 tablets bid             -miralax daily             -sorbitol prn   8/27- LBM last night and again today 13. Daily Headaches since stroke  8/27- will  start Topamax 25 mg QHS- explained to pt is weight neutral vs  weight loss- she agreed to med. Is started  8/28- pt reports appetite improvement/less with topamax- of course, hasn't had HA improvement quite yet- con't regimen 14. Dispo  8/28- due to pt's birthday, will allow grounds pass today, etc-   LOS: 2 days A FACE TO FACE EVALUATION WAS PERFORMED  Danielle Kelley 06/30/2021, 3:50 PM

## 2021-07-01 DIAGNOSIS — I639 Cerebral infarction, unspecified: Secondary | ICD-10-CM | POA: Diagnosis not present

## 2021-07-01 LAB — BASIC METABOLIC PANEL
Anion gap: 6 (ref 5–15)
BUN: <5 mg/dL — ABNORMAL LOW (ref 6–20)
CO2: 24 mmol/L (ref 22–32)
Calcium: 8.7 mg/dL — ABNORMAL LOW (ref 8.9–10.3)
Chloride: 107 mmol/L (ref 98–111)
Creatinine, Ser: 0.73 mg/dL (ref 0.44–1.00)
GFR, Estimated: >60 mL/min (ref >60)
Glucose, Bld: 90 mg/dL (ref 70–99)
Potassium: 3.5 mmol/L (ref 3.5–5.1)
Sodium: 137 mmol/L (ref 135–145)

## 2021-07-01 LAB — CBC
HCT: 33.4 % — ABNORMAL LOW (ref 36.0–46.0)
Hemoglobin: 10.8 g/dL — ABNORMAL LOW (ref 12.0–15.0)
MCH: 26.2 pg (ref 26.0–34.0)
MCHC: 32.3 g/dL (ref 30.0–36.0)
MCV: 81.1 fL (ref 80.0–100.0)
Platelets: 411 10*3/uL — ABNORMAL HIGH (ref 150–400)
RBC: 4.12 MIL/uL (ref 3.87–5.11)
RDW: 13.8 % (ref 11.5–15.5)
WBC: 6.3 10*3/uL (ref 4.0–10.5)
nRBC: 0 % (ref 0.0–0.2)

## 2021-07-01 LAB — MAGNESIUM: Magnesium: 1.8 mg/dL (ref 1.7–2.4)

## 2021-07-01 MED ORDER — CLOPIDOGREL BISULFATE 75 MG PO TABS
75.0000 mg | ORAL_TABLET | Freq: Every day | ORAL | Status: DC
Start: 2021-07-02 — End: 2021-07-02
  Administered 2021-07-02: 75 mg via ORAL
  Filled 2021-07-01: qty 1

## 2021-07-01 MED ORDER — TOPIRAMATE 25 MG PO TABS
50.0000 mg | ORAL_TABLET | Freq: Every day | ORAL | Status: DC
  Administered 2021-07-01: 50 mg via ORAL
  Filled 2021-07-01: qty 2

## 2021-07-01 MED ORDER — MAGNESIUM GLUCONATE 500 MG PO TABS
250.0000 mg | ORAL_TABLET | Freq: Every day | ORAL | Status: DC
  Administered 2021-07-01: 250 mg via ORAL
  Filled 2021-07-01: qty 1

## 2021-07-01 NOTE — IPOC Note (Signed)
Overall Plan of Care [facility]) Patient Details Name: Danielle Kelley MRN: [id number] DOB: [birth year]  Admitting Diagnosis: Acute CVA (cerebrovascular accident) [facility])  Hospital Problems: Principal Problem:   Acute CVA (cerebrovascular accident) [facility]) Active Problems:   Stroke (cerebrum) ([facility])     Functional Problem List: Nursing Endurance, Medication Management, Safety, Bowel  PT Balance, Motor  OT Balance, Cognition, Endurance, Motor, Pain  SLP    TR         Basic ADL's: OT Bathing, Dressing, Toileting     Advanced  ADL's: OT Simple Meal Preparation, Laundry, Light Housekeeping     Transfers: PT Bed to Chair, Car, Occupational psychologist, Research scientist (life sciences): PT Ambulation, Stairs     Additional Impairments: OT Fuctional Use of Upper Extremity  SLP        TR      Anticipated Outcomes Item Anticipated Outcome  Self Feeding independent  Swallowing      Basic self-care  mod I  Toileting  mod I   Bathroom Transfers mod I  Bowel/Bladder  manage w mod I assist  Transfers  independent  Locomotion  independent  Communication     Cognition     Pain  at or below level 4  Safety/Judgment  maintain w cues/reminders   Therapy Plan: PT Intensity: Minimum of 1-2 x/day ,45 to 90 minutes PT Frequency: 5 out of 7 days PT Duration Estimated Length of Stay: 3-5 days OT Intensity: Minimum of 1-2 x/day, 45 to 90 minutes OT Frequency: 5 out of 7 days OT Duration/Estimated Length of Stay: 3-5 days     Due to the current state of emergency, patients may not be receiving their 3-hours of Medicare-mandated therapy.   Team Interventions: Nursing Interventions Patient/Family Education, Bowel Management, Medication Management, Disease Management/Prevention, Discharge Planning, Pain Management  PT interventions Ambulation/gait training, Neuromuscular re-education, Stair training, Balance/vestibular training, Discharge planning, Therapeutic Activities, UE/LE  Coordination activities, Functional mobility training, Therapeutic Exercise, Patient/family education  OT Interventions Balance/vestibular training, Discharge planning, Pain management, Self Care/advanced ADL retraining, Therapeutic Activities, UE/LE Coordination activities, Cognitive remediation/compensation, Disease mangement/prevention, Functional mobility training, Patient/family education, Therapeutic Exercise, DME/adaptive equipment instruction, Neuromuscular re-education, Community reintegration, Psychosocial support, UE/LE Strength taining/ROM  SLP Interventions    TR Interventions    SW/CM Interventions Discharge Planning, Psychosocial Support, Patient/Family Education   Barriers to Discharge MD  Medical stability  Nursing Decreased caregiver support, Home environment access/layout 2 level 4ste, right rail w mother and 2 teen children  PT      OT      SLP      SW       Team Discharge Planning: Destination: PT-Home ,OT- Home , SLP-  Projected Follow-up: PT-None, OT-  Outpatient OT, SLP-  Projected Equipment Needs: PT-To be determined, OT- To be determined, SLP-  Equipment Details: PT- , OT-has no DME currently Patient/family involved in discharge planning: PT- Patient,  OT-Patient, SLP-   MD ELOS: 7-10 days Medical Rehab Prognosis:  Excellent Assessment: Danielle Kelley is a 40 year old woman admitted to CIR with left hemiparesis secondary to right posterior frontal corona radiata infarct. Course complicated by daily headaches and slow transit constipation. Medications are being managed, and labs and vitals are being monitored regularly.     See Team Conference Notes for weekly updates to the plan of care

## 2021-07-01 NOTE — Evaluation (Addendum)
Speech Language Pathology Assessment and Plan  Patient Details  Name: Danielle Kelley MRN: [id number] Date of Birth: [birth year]  SLP Diagnosis: N/A Rehab Potential: N/A ELOS: N/A   Today's Date: 07/01/2021 SLP Individual Time: 8563-1497 SLP Individual Time Calculation (min): 30 min   Hospital Problem: Principal Problem:   Acute CVA (cerebrovascular accident) ([place]) Active Problems:   Stroke (cerebrum) ([place])  Past Medical History:  Past Medical History:  Diagnosis Date   Obesity    Seasonal allergies    Past Surgical History:  Past Surgical History:  Procedure Laterality Date   ABDOMINAL SURGERY  04/06/2017   Gastric sleeve   BUBBLE STUDY N/A 06/26/2021   Procedure: BUBBLE STUDY;  Surgeon: Buford Dresser, MD;  Location: [facility];  Service: Cardiovascular;  Laterality: N/A;   CESAREAN SECTION     2004, 2006, 2007.   TEE WITHOUT CARDIOVERSION N/A 06/26/2021   Procedure: TRANSESOPHAGEAL ECHOCARDIOGRAM (TEE);  Surgeon: Buford Dresser, MD;  [facility] END[facility]COPY;  Service: Cardiovascular;  Laterality: N/A;    Assessment / Plan / Recommendation Clinical Impression  Patient requested SLP consult due to noticing mild cognitive-linguistic deficits since admission. Patient reports that at baseline, she is a high-level functioning individual with a job in [place] requiring her to be extremely deliberate and articulate with her verbal expression. Patient also reports that high level attention and working memory are also necessary aspects of her job as she must be able to divide her attention between talking and typing and recount important policies or events. Upon informal assessment, patient demonstrated decreased thought organization with intermittent high-level word-finding deficits resulting in mild dysfluencies. Mild deficits in short-term memory were also noted, however, patient appears to recall functional, daily information appropriately.  Patient is demonstrating  appropriate awareness of deficits with ability to self-correct with extra time.  Due to the high-level nature of the patient's deficits, a standardized assessment was not given as this SLP suspects patient would perform WNL and would not truly portray the functional impact the above deficits have on the patient.  Instead, the patient was given The General Short from of the Communication Participation Item Bank  in which she scored 25/30 points. Patient is currently set to discharge tomorrow, therefore, skilled SLP intervention will not be recommended at this venue. Recommend f/u outpatient SLP services to maximize her overall function and confidence to perform her daily, high-level activities at both home and work. Patient verbalized understanding and is in agreement.    Skilled Therapeutic Interventions          Administered a cognitive-linguistic evaluation, please see above for details.   SLP Assessment  Patient does not need any further Speech Lanaguage Pathology Services    Recommendations  Oral Care Recommendations: Oral care BID Patient destination: Home Follow up Recommendations: Outpatient SLP Equipment Recommended: None recommended by SLP    SLP Frequency  N/A  SLP Duration  SLP Intensity  SLP Treatment/Interventions  N/A   N/A    N/A   Pain Pain Assessment Pain Scale: 0-10 Pain Score: 0-No pain  SLP Evaluation Cognition Overall Cognitive Status: Impaired/Different from baseline Arousal/Alertness: Awake/alert Orientation Level: Oriented X4 Year: 2022 Month: August Day of Week: Correct Attention: Sustained;Selective;Alternating;Divided Sustained Attention: Appears intact Selective Attention: Appears intact Alternating Attention: Appears intact Divided Attention: Appears intact Memory: Impaired Memory Impairment: Retrieval deficit;Decreased recall of new information;Decreased short term memory Immediate Memory Recall: Sock;Blue;Bed Awareness: Appears  intact Problem Solving: Appears intact Sequencing: Appears intact Organizing: Appears intact Decision Making: Appears intact Initiating: Appears intact  Safety/Judgment: Appears intact  Comprehension Auditory Comprehension Overall Auditory Comprehension: Appears within functional limits for tasks assessed Expression Expression Primary Mode of Expression: Verbal Verbal Expression Overall Verbal Expression: Impaired Initiation: No impairment Level of Generative/Spontaneous Verbalization: Conversation Repetition: No impairment Naming: Not tested Confrontation: Not tested Other Naming Comments: Mild high-level word-finding deficits noted with decreased thoughout organization resulting in intermittent dysfluencies Pragmatics: No impairment Written Expression Dominant Hand: Right Written Expression: Not tested Oral Motor Oral Motor/Sensory Function Overall Oral Motor/Sensory Function: Within functional limits Motor Speech Overall Motor Speech: Appears within functional limits for tasks assessed  Care Tool Care Tool Cognition Ability to hear (with hearing aid or hearing appliances if normally used Ability to hear (with hearing aid or hearing appliances if normally used): 0. Adequate - no difficulty in normal conservation, social interaction, listening to TV   Expression of Ideas and Wants Expression of Ideas and Wants: 4. Without difficulty (complex and basic) - expresses complex messages without difficulty and with speech that is clear and easy to understand   Understanding Verbal and Non-Verbal Content Understanding Verbal and Non-Verbal Content: 4. Understands (complex and basic) - clear comprehension without cues or repetitions  Memory/Recall Ability Memory/Recall Ability : Current season;Location of own room;Staff names and faces;That he or she is in a hospital/hospital unit    Short Term Goals: N/A   Refer to Care Plan for Long Term Goals  Recommendations for other  services: None   Discharge Criteria: Patient will be discharged from SLP if patient refuses treatment 3 consecutive times without medical reason, if treatment goals not met, if there is a change in medical status, if patient makes no progress towards goals or if patient is discharged from hospital.  The above assessment, treatment plan, treatment alternatives and goals were discussed and mutually agreed upon: by patient  Danielle Kelley 07/01/2021, 12:43 PM

## 2021-07-01 NOTE — Progress Notes (Signed)
Inpatient Rehabilitation Care Coordinator Discharge Note   Patient Details  Name: Danielle Kelley MRN: [id number] Date of Birth: [birth year]   Discharge location: HOME WITH MOM AND TWO TEENAGERS  Length of Stay:  4 DAYS  Discharge activity level: INDEPENDENT LEVEL  Home/community participation: YES  Patient response AT:FTDDUK Literacy - How often do you need to have someone help you when you read instructions, pamphlets, or other written material from your doctor or pharmacy?: Never  Patient response GU:RKYHCW Isolation - How often do you feel lonely or isolated from those around you?: Never  Services provided included: MD, RD, PT, OT, SLP, RN, CM, Pharmacy, SW  Financial Services:  Financial Services Utilized: Barrister's clerk OF [place]  Choices offered to/list presented to: PT  Follow-up services arranged:  Outpatient    Outpatient Servicies: [facility] WILL CONTACT PT TO SET UP APPOINTMENTS  [facility] REFERRAL MADE FOR PT    Patient response to transportation need: Is the patient able to respond to transportation needs?: Yes In the past 12 months, has lack of transportation kept you from medical appointments or from getting medications?: No In the past 12 months, has lack of transportation kept you from meetings, work, or from getting things needed for daily living?: No    Comments (or additional information):PT DID REMARKABLE WELL AND REACHED INDEPENDENT LEVEL. GAVE [facility] REFERRAL TO FOR GETTING TO OP APPOINTMENTS. HER MOM WHOM LIVES WITH HER HAS HAD A CVA AND CAN NOT DRIVE.   Patient/Family verbalized understanding of follow-up arrangements:  Yes  Individual responsible for coordination of the follow-up plan: SELF [phone]  Confirmed correct DME delivered: [name] 07/01/2021    Leinani Lisbon, Lemar Livings

## 2021-07-01 NOTE — Progress Notes (Signed)
Physical Therapy Session Note  Patient Details  Name: Danielle Kelley MRN: [id number] Date of Birth: 06/29/1981  Today's Date: 07/01/2021 PT Individual Time: 0915-1030 PT Individual Time Calculation (min): 75 min   Short Term Goals: Week 1:  PT Short Term Goal 1 (Week 1): STGs=LTGs  Skilled Therapeutic Interventions/Progress Updates:     Pt supine in bed to start - no reports of pain and reports eagerness to return home. Bed mobility completed mod I with bed features. She requests to use bathroom prior to going to rehab gym. Sit<>stand with supervision and no AD from EOB. Ambulates with supervision and no AD to toilet and pt continent of bladder without assist. Ambulates sinkside to wash hands with distant supervision and also perform some self care tasks (combing hair, brushing teeth) without assist as well. Ambulates to main rehab gym with supervision and no AD, ~268ft. She negotiated stairs, 12 steps, at indep level with good safety awareness and understanding of precautions to reduce risk of L knee buckling. Pt requesting to work on ambulating outside. Donned shoes at setupA level at edge of mat table. Ambulated from 53M rehab gym downstairs (standing rest break in the elevator) to outside with supervision and no AD. While outdoors, worked on ambulating on unlevel surfaces and sitting to various seated surfaces. Also discussed biopsychosocial factors related to stroke recovery and pt very receptive. Ambulated back upstairs, progressing from supervision to indep, no AD. In rehab gym, worked on ambulating up/down ramp, up/down curbs, and around unlevel surfaces in mulch pit - all at indep level. She completed car transfers at indep level in both passenger side and driver side without difficulty. She also completed Nustep at workload 6, using BLE only to isolate legs, for 4 minutes. She ambulated back to her room indep with no AD. Remained seated in recliner with all needs in reach. Ambulated >532ft  during session.   Therapy Documentation Precautions:  Precautions Precautions: Fall Restrictions Weight Bearing Restrictions: No General:    Therapy/Group: Individual Therapy  Orrin Brigham 07/01/2021, 7:48 AM

## 2021-07-01 NOTE — Progress Notes (Signed)
Inpatient Rehabilitation  Patient information reviewed and entered into eRehab system by Cressie Betzler M. Ned Kakar, M.A., CCC/SLP, PPS Coordinator.  Information including medical coding, functional ability and quality indicators will be reviewed and updated through discharge.    

## 2021-07-01 NOTE — Discharge Instructions (Addendum)
Inpatient Rehab Discharge Instructions  Kelsi Benham Discharge date and time: 07/02/21   Activities/Precautions/ Functional Status: Activity: activity as tolerated Diet: diabetic diet Wound Care: none needed   Functional status:  ___ No restrictions     ___ Walk up steps independently ___ 24/7 supervision/assistance   ___ Walk up steps with assistance _X__ Intermittent supervision/assistance  ___ Bathe/dress independently ___ Walk with walker     ___ Bathe/dress with assistance ___ Walk Independently    ___ Shower independently ___ Walk with assistance    ___ Shower with assistance ___ No alcohol     ___ Return to work/school ________  Special Instructions:  COMMUNITY REFERRALS UPON DISCHARGE:    Outpatient: PT & SP             Agency:[facility] Phone:[phone]              Appointment Date/Time:WILL CALL PATIENT TO SET UP FOLLOW UP APPOINTMENTS  Medical Equipment/Items Ordered:NO NEEDS                                                 Agency/Supplier:NA  [facility] [phone]  STROKE/TIA DISCHARGE INSTRUCTIONS SMOKING Cigarette smoking nearly doubles your risk of having a stroke & is the single most alterable risk factor  If you smoke or have smoked in the last 12 months, you are advised to quit smoking for your health. Most of the excess cardiovascular risk related to smoking disappears within a year of stopping. Ask you doctor about anti-smoking medications [place] Quit Line: 1-800-QUIT NOW Free Smoking Cessation Classes [phone]  CHOLESTEROL Know your levels; limit fat & cholesterol in your diet  Lipid Panel     Component Value Date/Time   CHOL 196 06/24/2021 1606   TRIG 148 06/24/2021 1606   HDL 46 06/24/2021 1606   CHOLHDL 4.3 06/24/2021 1606   VLDL 30 06/24/2021 1606   LDLCALC 120 (H) 06/24/2021 1606     Many patients benefit from treatment even if their cholesterol is at goal. Goal: Total Cholesterol (CHOL) less  than 160 Goal:  Triglycerides (TRIG) less than 150 Goal:  HDL greater than 40 Goal:  LDL (LDLCALC) less than 100   BLOOD PRESSURE American Stroke Association blood pressure target is less that 120/80 mm/Hg  Your discharge blood pressure is:  BP: 134/85 Monitor your blood pressure Limit your salt and alcohol intake Many individuals will require more than one medication for high blood pressure  DIABETES (A1c is a blood sugar average for last 3 months) Goal HGBA1c is under 7% (HBGA1c is blood sugar average for last 3 months)  Diabetes: Pre-diabetes    Lab Results  Component Value Date   HGBA1C 5.9 (H) 06/24/2021    Your HGBA1c can be lowered with medications, healthy diet, and exercise. Check your blood sugar as directed by your physician Call your physician if you experience unexplained or low blood sugars.  PHYSICAL ACTIVITY/REHABILITATION Goal is 30 minutes at least 4 days per week  Activity: No driving, Therapies: see above Return to work: N/A Activity decreases your risk of heart attack and stroke and makes your heart stronger.  It helps control your weight and blood pressure; helps you relax and can improve your mood. Participate in a regular exercise program. Talk with your doctor about the best form of exercise for you (dancing, walking,  swimming, cycling).  DIET/WEIGHT Goal is to maintain a healthy weight  Your discharge diet is:  Diet Order             Diet Heart Room service appropriate? Yes; Fluid consistency: Thin  Diet effective now                  thin liquids Your height is:  Height: 5\' 10"  (177.8 cm) Your current weight is: Weight: (!) 287 lbs Your Body Mass Index (BMI) is:  BMI (Calculated): 45.33 Following the type of diet specifically designed for you will help prevent another stroke. Your goal weight  is 174 lbs Your goal Body Mass Index (BMI) is 19-24. Healthy food habits can help reduce 3 risk factors for stroke:  High cholesterol, hypertension, and  excess weight.  RESOURCES Stroke/Support Group:  Call [phone]   STROKE EDUCATION PROVIDED/REVIEWED AND GIVEN TO PATIENT Stroke warning signs and symptoms How to activate emergency medical system (call 911). Medications prescribed at discharge. Need for follow-up after discharge. Personal risk factors for stroke. Pneumonia vaccine given:  Flu vaccine given:  My questions have been answered, the writing is legible, and I understand these instructions.  I will adhere to these goals & educational materials that have been provided to me after my discharge from the hospital.      My questions have been answered and I understand these instructions. I will adhere to these goals and the provided educational materials after my discharge from the hospital.  Patient/Caregiver Signature _______________________________ Date __________  Clinician Signature _______________________________________ Date __________  Please bring this form and your medication list with you to all your follow-up doctor's appointments.

## 2021-07-01 NOTE — Progress Notes (Signed)
Occupational Therapy Discharge Summary  Patient Details  Name: Danielle Kelley MRN: [id number] Date of Birth: August 08, 1981  Today's Date: 07/01/2021 OT Individual Time: 1100-1200; 1300-1400 OT Individual Time Calculation (min): 60 min and 60 min   Patient has met 10 of 10 long term goals due to improved activity tolerance, improved balance, postural control, ability to compensate for deficits, functional use of  LEFT upper and LEFT lower extremity, improved attention, and improved coordination.  Patient to discharge at overall Independent level.  Patient's care partner is independent to provide the necessary cognitive assistance at discharge (for driving).    Skilled Intervention:   First session: Pt has no c/o pain, requesting to shower.  Pt completed all self care and functional mobility with independence including sit<>stand from recliner, ambulation to and from bathroom, ADL item retrieval, UB/LB dressing and bathing standing in walk in shower, combing and putting hair up and brushing teeth standing at sink, and straightening up space including picking up items from floor.  DC planning discussed including recommendation for outpatient OT to further treat higher level NMR and strengthening of LUE.   Second session:  Pt ambulated to dayroom approximately 150 feet without AD with mod I.  Educated pt on various [place] tasks and provided HEP handout.  Pt shuffled cards, picked up coins and dice through finger<>palm translation without difficulty using left non-dominant hand.  Pt ambulated to ADL suite and demonstrated retrieving pots from lower cabinets, filling with water, and transporting to opposite counter top, then emptying, drying, and returning to cabinet with independence at ambulation level.  Pt reports using a timer when cooking.  Pt ambulated to room and retrieved dirty clothing and ambulated to washer/dryer approximately 400 feet total and started load in wash with independence.  Returned to room  and OT assessed LUE strength per below.  Provision of HEP using level 3 therapy band shoulder abduction/adduction, scaption, and biceps curls for 12 reps each.  Also provision of medium resistive putty and instructed on gross grasp x 5 minutes 1-2 times per day.  Pt able to recall all HEP with independence.  Call bell in reach at end of session.     Recommendation:  Patient will benefit from ongoing skilled OT services in outpatient setting to continue to advance functional skills in the area of iADL, strengthening and neuro re-ed LUE.  Equipment: No equipment provided  Reasons for discharge: treatment goals met and discharge from hospital  Patient/family agrees with progress made and goals achieved: Yes  OT Discharge Precautions/Restrictions  Precautions Precautions: Fall Restrictions Weight Bearing Restrictions: No Pain Pain Assessment Pain Scale: 0-10 Pain Score: 0-No pain ADL ADL Eating: Independent Where Assessed-Eating: Chair Grooming: Independent Where Assessed-Grooming: Standing at sink Upper Body Bathing: Supervision/safety Where Assessed-Upper Body Bathing: Shower Lower Body Bathing: Independent Where Assessed-Lower Body Bathing: Shower Upper Body Dressing: Independent Where Assessed-Upper Body Dressing: Chair Lower Body Dressing: Modified independent Where Assessed-Lower Body Dressing: Chair Toileting: Independent Toilet Transfer: Independent Armed forces technical officer Method: Counselling psychologist: Emergency planning/management officer Transfer: IT consultant Method: Heritage manager: Grab bars Vision Baseline Vision/History: 1 Wears glasses Patient Visual Report: No change from baseline Vision Assessment?: No apparent visual deficits Perception  Perception: Within Functional Limits Praxis Praxis: Intact Cognition Arousal/Alertness: Awake/alert Orientation Level: Oriented X4 Year: 2022 Month: August Day of Week:  Correct Attention: Sustained;Selective;Alternating;Divided Sustained Attention: Appears intact Selective Attention: Appears intact Alternating Attention: Appears intact Divided Attention: Appears intact Memory: Appears intact Immediate Memory Recall: Sock;Blue;Bed Awareness:  Appears intact Problem Solving: Appears intact Sequencing: Appears intact Organizing: Appears intact Decision Making: Appears intact Initiating: Appears intact Safety/Judgment: Appears intact Sensation Sensation Light Touch: Appears Intact Hot/Cold: Appears Intact Proprioception: Appears Intact Stereognosis: Appears Intact Coordination Gross Motor Movements are Fluid and Coordinated: Yes Finger Nose Finger Test: intact Motor    Mobility  Bed Mobility Rolling Right: Independent Rolling Left: Independent Supine to Sit: Independent Sit to Supine: Independent Transfers Sit to Stand: Independent Stand to Sit: Independent  Trunk/Postural Assessment  Cervical Assessment Cervical Assessment: Within Functional Limits Thoracic Assessment Thoracic Assessment: Within Functional Limits Lumbar Assessment Lumbar Assessment: Within Functional Limits Postural Control Postural Control: Within Functional Limits  Balance Balance Balance Assessed: Yes Static Sitting Balance Static Sitting - Balance Support: No upper extremity supported;Feet supported Static Sitting - Level of Assistance: 7: Independent Dynamic Sitting Balance Dynamic Sitting - Balance Support: No upper extremity supported;During functional activity;Feet supported Dynamic Sitting - Level of Assistance: 7: Independent Static Standing Balance Static Standing - Balance Support: No upper extremity supported Static Standing - Level of Assistance: 7: Independent Dynamic Standing Balance Dynamic Standing - Balance Support: No upper extremity supported;During functional activity Dynamic Standing - Level of Assistance: 6: Modified independent  (Device/Increase time) Extremity/Trunk Assessment RUE Assessment RUE Assessment: Within Functional Limits LUE Assessment Passive Range of Motion (PROM) Comments: WNL Active Range of Motion (AROM) Comments: WNL General Strength Comments: 4-/5 globally   Ezekiel Slocumb 07/01/2021, 11:31 AM

## 2021-07-01 NOTE — Progress Notes (Signed)
PROGRESS NOTE   Subjective/Complaints: Danielle Kelley is doing very well! Stable for d/c tomorrow Discussed 2 week plan for return to driving- requesting that her ex-husband stay to help until then    ROS:  Pt denies SOB, abd pain, CP, N/V/C/D, and vision changes, + headaches   Objective:   No results found. Recent Labs    06/29/21 0454 07/01/21 0619  WBC 7.2 6.3  HGB 11.2* 10.8*  HCT 33.4* 33.4*  PLT 401* 411*   Recent Labs    06/29/21 0454 07/01/21 0619  NA 136 137  K 3.6 3.5  CL 105 107  CO2 24 24  GLUCOSE 101* 90  BUN 8 <5*  CREATININE 0.70 0.73  CALCIUM 8.9 8.7*    Intake/Output Summary (Last 24 hours) at 07/01/2021 1430 Last data filed at 07/01/2021 1300 Gross per 24 hour  Intake 120 ml  Output --  Net 120 ml        Physical Exam: Vital Signs Blood pressure (!) 139/92, pulse 81, temperature 98.2 F (36.8 C), temperature source Oral, resp. rate 17, height 5\' 10"  (1.778 m), weight (!) 143.3 kg, last menstrual period 06/11/2021, SpO2 100 %. Gen: no distress, normal appearing HEENT: oral mucosa pink and moist, NCAT Cardio: Reg rate Chest: normal effort, normal rate of breathing Abd: soft, non-distended Ext: no edema Psych: pleasant, normal affect  Musculoskeletal:        General: No swelling or tenderness. Normal range of motion.     Cervical back: Normal range of motion and neck supple.     Right lower leg: No edema.     Left lower leg: No edema.  Skin:    General: Skin is warm and dry.  Neurological:     Mental Status: She is alert.     Comments: Alert and oriented x 3. Normal insight and awareness. Intact Memory. Normal language and speech. Cranial nerve exam unremarkable except for left central 7. RUE and RLE 5/5. LUE 4/5 prox to 3/5 distally at wrist, HI. LLE 2 to 2+/5 prox to 1+ to 2/5 ADF/PF. Mild loss of LT/PP in left foot, less moving proximally. Otherwise no sensory deficits. No  resting tone, DTR's 1+ to 2+.    Assessment/Plan: 1. Functional deficits which require 3+ hours per day of interdisciplinary therapy in a comprehensive inpatient rehab setting. Physiatrist is providing close team supervision and 24 hour management of active medical problems listed below. Physiatrist and rehab team continue to assess barriers to discharge/monitor patient progress toward functional and medical goals  Care Tool:  Bathing    Body parts bathed by patient: Right arm, Right lower leg, Left lower leg, Left arm, Chest, Face, Abdomen, Front perineal area, Buttocks, Right upper leg, Left upper leg         Bathing assist Assist Level: Independent     Upper Body Dressing/Undressing Upper body dressing   What is the patient wearing?: Hospital gown only    Upper body assist Assist Level: Independent    Lower Body Dressing/Undressing Lower body dressing      What is the patient wearing?: Underwear/pull up     Lower body assist Assist for lower body dressing: Independent  Toileting Toileting    Toileting assist Assist for toileting: Independent     Transfers Chair/bed transfer  Transfers assist     Chair/bed transfer assist level: Independent     Locomotion Ambulation   Ambulation assist   Ambulation activity did not occur: Safety/medical concerns  Assist level: Independent Assistive device: No Device Max distance: 500   Walk 10 feet activity   Assist     Assist level: Independent Assistive device: No Device   Walk 50 feet activity   Assist    Assist level: Independent Assistive device: No Device    Walk 150 feet activity   Assist    Assist level: Independent Assistive device: No Device    Walk 10 feet on uneven surface  activity   Assist     Assist level: Independent Assistive device: Other (comment) (none)   Wheelchair     Assist Is the patient using a wheelchair?: No             Wheelchair 50 feet  with 2 turns activity    Assist            Wheelchair 150 feet activity     Assist          Blood pressure (!) 139/92, pulse 81, temperature 98.2 F (36.8 C), temperature source Oral, resp. rate 17, height 5\' 10"  (1.778 m), weight (!) 143.3 kg, last menstrual period 06/11/2021, SpO2 100 %.  Medical Problem List and Plan: 1.  Left hemiparesis secondary to right posterior frontal corona radiata infarct             -patient may shower             -ELOS/Goals: 7-10 days, Mod I goals for PT and OT             Continue PT and OT_ and add SLP for pt.  2.  Impaired mobility, ambulating >650 feet: discontinue Lovenox             -antiplatelet therapy: DAPT X 17 more days followed by ASA alone 3. Headaches/Pain Management: increase topamax to 50mg  4. Mood: LCSW to follow for evaluation and suppor.t d             -antipsychotic agents: N/A 5. Neuropsych: This patient is capable of making decisions on her own behalf. 6. Skin/Wound Care: Routine pressure relief measures.  7. Fluids/Electrolytes/Nutrition: Monitor I/O. Check lytes in am.  8. Pre-diabetes: Hgb A1c-5.9. Will add CM restrictions.  9. Vitamin B 12 deficiency: B12 level- 141   10. Dyslipidemia: LDL 120-->now on lipitor.  11. Morbid obesity s/p gastric sleeve: Will check Vitamin B1 level, Mg and Vit D to rule out deficiency --start multivitamin.  -needs further education on diet to help reduce stroke risk and manage weight 12. Slow transit constipation:             -senna-s 2 tablets bid             -miralax daily             -sorbitol prn   8/27- LBM last night and again today 13. Daily Headaches since stroke  8/27- will start Topamax 25 mg QHS- explained to pt is weight neutral vs weight loss- she agreed to med. Is started  8/28- pt reports appetite improvement/less with topamax- of course, hasn't had HA improvement quite yet- con't regimen 14. Obesity BMI 45.33: provided dietary counseling, continue topamax 14.  Dispo  8/28- due to pt's birthday, will  allow grounds pass today, etc-   LOS: 3 days A FACE TO FACE EVALUATION WAS PERFORMED  Claudell Rhody P Kleo Dungee 07/01/2021, 2:30 PM

## 2021-07-01 NOTE — Progress Notes (Signed)
Inpatient Rehabilitation Care Coordinator Assessment and Plan Patient Details  Name: Danielle Kelley MRN: [id number] Date of Birth: 1981-09-04  Today's Date: 07/01/2021  Hospital Problems: Principal Problem:   Acute CVA (cerebrovascular accident) [facility]) Active Problems:   Stroke (cerebrum) [facility])  Past Medical History:  Past Medical History:  Diagnosis Date   Obesity    Seasonal allergies    Past Surgical History:  Past Surgical History:  Procedure Laterality Date   ABDOMINAL SURGERY  04/06/2017   Gastric sleeve   BUBBLE STUDY N/A 06/26/2021   Procedure: BUBBLE STUDY;  Surgeon: Jodelle Red, MD;  Location: [facility] ENDOSCOPY;  Service: Cardiovascular;  Laterality: N/A;   CESAREAN SECTION     2004, 2006, 2007.   TEE WITHOUT CARDIOVERSION N/A 06/26/2021   Procedure: TRANSESOPHAGEAL ECHOCARDIOGRAM (TEE);  Surgeon: Jodelle Red, MD;  Location: [facility] ENDOSCOPY;  Service: Cardiovascular;  Laterality: N/A;   Social History:  has no history on file for tobacco use, alcohol use, and drug use.  Family / Support Systems Marital Status: Divorced Patient Roles: Parent, Other (Comment), Caregiver (employee) Children: 15 & 80 yo children and 56 yo who has just started school at [employer] Other Supports: Christian-friend [phone] Anticipated Caregiver: Self pt high level ans will reach mod/i level Ability/Limitations of Caregiver: NA Caregiver Availability: Intermittent Family Dynamics: Close knit wit Mom who lives with her and children 2 & 80 yo. 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 She is doing quite well and will be very short length of stay  Social History Preferred language: English Religion:  Cultural Background: No issues Education: Charity fundraiser - How often do you need to have someone help you when you read instructions, pamphlets, or other written material from your doctor or pharmacy?: Never Writes: Yes Employment Status: Employed Name of  Employer: [employer] Return to Work Plans: On short term leave plans to return once able Legal History/Current Legal Issues: No issues Guardian/Conservator: None-according to MD pt is capable of making her own decisions while here   Abuse/Neglect Abuse/Neglect Assessment Can Be Completed: Yes Physical Abuse: Denies Verbal Abuse: Denies Sexual Abuse: Denies Exploitation of patient/patient's resources: Denies Self-Neglect: Denies  Patient response to: Social Isolation - How often do you feel lonely or isolated from those around you?: Never  Emotional Status Pt's affect, behavior and adjustment status: Pt is very pleased with how well she is doing and the progress she has made since coming into the hospital. She feels ready to go home and is almost back to her baseline functioning. Recent Psychosocial Issues: thought healthy prior to admission Psychiatric History: No issues Substance Abuse History: None  Patient / Family Perceptions, Expectations & Goals Pt/Family understanding of illness & functional limitations: Pt is able to explain her stroke and the deficits she had when first admitted. She is doing well enough will be going home tomorrow. She still would like to know what caused her stroke so as to prevent another one Premorbid pt/family roles/activities: mom, employee, caregiver, etc Anticipated changes in roles/activities/participation: resume Pt/family expectations/goals: Pt states: " I want to be independent like i was prior to coming into the hospital. I think I am almost back to this."  [employer]: None Premorbid Home Care/DME Agencies: None Transportation available at discharge: pt was the driver. Mom has had a CVA and can not drive Is the patient able to respond to transportation needs?: Yes In the past 12 months, has lack of transportation kept you from  medical appointments or from getting medications?: No In the past 12 months, has  lack of transportation kept you from meetings, work, or from getting things needed for daily living?: No  Discharge Planning Living Arrangements: Children, Parent Support Systems: Children, Parent, Other relatives, Friends/neighbors Type of Residence: Private residence Insurance Resources: Media planner (specify) Herbalist) Financial Resources: Employment Living Expenses: Rent Money Management: Patient Does the patient have any problems obtaining your medications?: No Home Management: self Patient/Family Preliminary Plans: Return home with her Mom and two teenagers she is mod/i and able to take care of herself. Her ex-husband is here to take children to school and moved in daughter to college last weekend. Care Coordinator Barriers to Discharge: Decreased caregiver support Care Coordinator Anticipated Follow Up Needs: HH/OP  Clinical Impression Pleasant female who is doing quite well and mod/I in her room already. Will be ready for discharge tomorrow. Only need is OPPT close to [employer] will make referral and give transportation information to  [name] 07/01/2021, 11:01 AM

## 2021-07-01 NOTE — Progress Notes (Signed)
Inpatient Rehabilitation Center Individual Statement of Services  Patient Name:  Danielle Kelley  Date:  07/01/2021  Welcome to the Inpatient Rehabilitation Center.  Our goal is to provide you with an individualized program based on your diagnosis and situation, designed to meet your specific needs.  With this comprehensive rehabilitation program, you will be expected to participate in at least 3 hours of rehabilitation therapies Monday-Friday, with modified therapy programming on the weekends.  Your rehabilitation program will include the following services:  Physical Therapy (PT), Occupational Therapy (OT), 24 hour per day rehabilitation nursing, Care Coordinator, Rehabilitation Medicine, Nutrition Services, and Pharmacy Services  Weekly team conferences will be held on Tuesda to discuss your progress.  Your Inpatient Rehabilitation Care Coordinator will talk with you frequently to get your input and to update you on team discussions.  Team conferences with you and your family in attendance may also be held.  Expected length of stay: 4-6 days  Overall anticipated outcome: independent with device  Depending on your progress and recovery, your program may change. Your Inpatient Rehabilitation Care Coordinator will coordinate services and will keep you informed of any changes. Your Inpatient Rehabilitation Care Coordinator's name and contact numbers are listed  below.  The following services may also be recommended but are not provided by the Inpatient Rehabilitation Center:  Driving Evaluations Home Health Rehabiltiation Services Outpatient Rehabilitation Services Vocational Rehabilitation   Arrangements will be made to provide these services after discharge if needed.  Arrangements include referral to agencies that provide these services.  Your insurance has been verified to be:  bcbs Your primary doctor is:  [place]  Pertinent information will be shared with your doctor and your  insurance company.  Inpatient Rehabilitation Care Coordinator:  Dossie Der, [place] [phone] or Luna Glasgow  Information discussed with and copy given to patient by: [name], 07/01/2021, 8:41 AM

## 2021-07-01 NOTE — Discharge Summary (Signed)
Physical Therapy Discharge Summary  Patient Details  Name: Danielle Kelley MRN: [id number] Date of Birth: 1981/04/28  Patient has met 7 of 7 long term goals due to improved activity tolerance, improved balance, improved postural control, increased strength, ability to compensate for deficits, and functional use of  left lower extremity.  Patient to discharge at an ambulatory level Independent.   Patient's care partner is independent to provide the necessary physical and cognitive assistance at discharge.  Reasons goals not met: n/a  Recommendation:  Patient will benefit from ongoing skilled PT services in outpatient setting to continue to advance safe functional mobility, address ongoing impairments in LLE weakness, dynamic gait and balance to minimize fall risk.  Equipment: No equipment provided  Reasons for discharge: treatment goals met and discharge from hospital  Patient/family agrees with progress made and goals achieved: Yes  PT Discharge Precautions/Restrictions Precautions Precautions: Fall Restrictions Weight Bearing Restrictions: No Pain Pain Assessment Pain Scale: 0-10 Pain Score: 0-No pain Pain Interference Pain Interference Pain Effect on Sleep: 0. Does not apply - I have not had any pain or hurting in the past 5 days Pain Interference with Therapy Activities: 0. Does not apply - I have not received rehabilitationtherapy in the past 5 days Pain Interference with Day-to-Day Activities: 1. Rarely or not at all Vision/Perception  Vision - History Ability to See in Adequate Light: 0 Adequate Perception Perception: Within Functional Limits Praxis Praxis: Intact  Cognition Overall Cognitive Status: Within Functional Limits for tasks assessed Arousal/Alertness: Awake/alert Orientation Level: Oriented X4 Year: 2022 Month: August Day of Week: Correct Attention: Sustained;Selective;Alternating;Divided Sustained Attention: Appears intact Selective Attention:  Appears intact Alternating Attention: Appears intact Divided Attention: Appears intact Memory: Appears intact Immediate Memory Recall: Sock;Blue;Bed Awareness: Appears intact Problem Solving: Appears intact Sequencing: Appears intact Organizing: Appears intact Decision Making: Appears intact Initiating: Appears intact Safety/Judgment: Appears intact Sensation Sensation Light Touch: Appears Intact Hot/Cold: Appears Intact Proprioception: Appears Intact Stereognosis: Appears Intact Coordination Gross Motor Movements are Fluid and Coordinated: Yes Finger Nose Finger Test: intact Heel Shin Test: WFL Motor  Motor Motor: Hemiplegia Motor - Discharge Observations: mild hemi  Mobility Bed Mobility Bed Mobility: Supine to Sit;Rolling Right;Rolling Left Rolling Right: Independent Rolling Left: Independent Supine to Sit: Independent Sit to Supine: Independent Transfers Transfers: Sit to Stand;Stand to [employer] Transfers Sit to Stand: Independent Stand to Sit: Independent Stand Pivot Transfers: Independent Transfer (Assistive device): None Locomotion  Gait Ambulation: Yes Gait Assistance: Independent Gait Distance (Feet): 500 Feet Assistive device: None Gait Gait: Yes Gait Pattern: Within Functional Limits Stairs / Additional Locomotion Stairs: Yes Stairs Assistance: Independent Stair Management Technique: Two rails Number of Stairs: 12 Height of Stairs: 6 Ramp: Independent Curb: Independent Pick up small object from the floor assist level: Independent Wheelchair Mobility Wheelchair Mobility: No  Trunk/Postural Assessment  Cervical Assessment Cervical Assessment: Within Functional Limits Thoracic Assessment Thoracic Assessment: Within Functional Limits Lumbar Assessment Lumbar Assessment: Within Functional Limits Postural Control Postural Control: Within Functional Limits  Balance Balance Balance Assessed: Yes Static Sitting Balance Static Sitting -  Balance Support: No upper extremity supported;Feet supported Static Sitting - Level of Assistance: 7: Independent Dynamic Sitting Balance Dynamic Sitting - Balance Support: No upper extremity supported;During functional activity;Feet supported Dynamic Sitting - Level of Assistance: 7: Independent Static Standing Balance Static Standing - Balance Support: No upper extremity supported Static Standing - Level of Assistance: 7: Independent Dynamic Standing Balance Dynamic Standing - Balance Support: No upper extremity supported;During functional activity Dynamic Standing - Level of  Assistance: 6: Modified independent (Device/Increase time) Extremity Assessment  RUE Assessment RUE Assessment: Within Functional Limits LUE Assessment Passive Range of Motion (PROM) Comments: WNL Active Range of Motion (AROM) Comments: WNL RLE Assessment RLE Assessment: Within Functional Limits LLE Assessment LLE Assessment: Within Functional Limits    Christian P Manhard PT, DPT 07/01/2021, 12:29 PM

## 2021-07-02 MED ORDER — ADULT MULTIVITAMIN W/MINERALS CH: 1.0000 | ORAL_TABLET | Freq: Every day | ORAL | 0 refills | Status: AC

## 2021-07-02 MED ORDER — POLYETHYLENE GLYCOL 3350 17 G PO PACK: 17.0000 g | PACK | Freq: Every day | ORAL | 0 refills | Status: DC

## 2021-07-02 MED ORDER — SENNOSIDES-DOCUSATE SODIUM 8.6-50 MG PO TABS: 2.0000 | ORAL_TABLET | Freq: Two times a day (BID) | ORAL | 0 refills | Status: DC

## 2021-07-02 MED ORDER — ACETAMINOPHEN 325 MG PO TABS: 325.0000 mg | ORAL_TABLET | ORAL | Status: DC | PRN

## 2021-07-02 MED ORDER — FERROUS SULFATE 325 (65 FE) MG PO TABS: 325.0000 mg | ORAL_TABLET | Freq: Every day | ORAL | 3 refills | Status: DC

## 2021-07-02 MED ORDER — TOPIRAMATE 50 MG PO TABS: 50.0000 mg | ORAL_TABLET | Freq: Every day | ORAL | 0 refills | Status: DC

## 2021-07-02 MED ORDER — ATORVASTATIN CALCIUM 40 MG PO TABS
40.0000 mg | ORAL_TABLET | Freq: Every day | ORAL | 0 refills | Status: DC
Start: 2021-07-02 — End: 2021-07-31

## 2021-07-02 MED ORDER — CLOPIDOGREL BISULFATE 75 MG PO TABS
75.0000 mg | ORAL_TABLET | Freq: Every day | ORAL | 0 refills | Status: DC
Start: 2021-07-02 — End: 2021-09-17

## 2021-07-02 MED ORDER — CYANOCOBALAMIN 1000 MCG PO TABS: 1000.0000 ug | ORAL_TABLET | Freq: Every day | ORAL | 0 refills | Status: DC

## 2021-07-02 MED ORDER — FERROUS SULFATE 325 (65 FE) MG PO TABS: 325.0000 mg | ORAL_TABLET | ORAL | 3 refills | Status: DC

## 2021-07-02 MED ORDER — ASPIRIN 81 MG PO CHEW: 81.0000 mg | CHEWABLE_TABLET | Freq: Every day | ORAL | 1 refills | Status: AC

## 2021-07-02 MED ORDER — MAGNESIUM GLUCONATE 500 MG PO TABS: 250.0000 mg | ORAL_TABLET | Freq: Every day | ORAL | 0 refills | Status: DC

## 2021-07-02 NOTE — Discharge Summary (Signed)
Physician Discharge Summary  Patient ID: Jaiyla Granados MRN: [id number] DOB/AGE: [birth year] 40 y.o.  Admit date: 06/28/2021 Discharge date: 07/02/2021  Discharge Diagnoses:  Principal Problem:   Acute CVA (cerebrovascular accident) [facility]) Active Problems:   Iron deficiency anemia   Vitamin B 12 deficiency   Morbid obesity (HCC)   New onset of headaches   Discharged Condition: good  Significant Diagnostic Studies: N/A   Labs:  Basic Metabolic Panel: Recent Labs  Lab 06/29/21 0454 07/01/21 0619  NA 136 137  K 3.6 3.5  CL 105 107  CO2 24 24  GLUCOSE 101* 90  BUN 8 <5*  CREATININE 0.70 0.73  CALCIUM 8.9 8.7*  MG  --  1.8    CBC: Recent Labs  Lab 06/27/21 0414 06/29/21 0454 07/01/21 0619  WBC 6.5 7.2 6.3  NEUTROABS 3.5 3.9  --   HGB 11.5* 11.2* 10.8*  HCT 35.5* 33.4* 33.4*  MCV 80.3 79.5* 81.1  PLT 441* 401* 411*    CBG: No results for input(s): GLUCAP in the last 168 hours.  Brief HPI:   Camani Sesay is a 40 y.o. female with history of morbid obesity s/p gastric sleeve otherwise in relatively good health who was admitted on 06/24/2021 with reports of left-sided numbness with weakness, heaviness and difficulty walking.  MRI brain done revealing small acute infarct in right posterior frontal corona radiata and MRA was negative for LVO or stenosis.  TEE done revealing EF 60 to 65% with no wall abnormality, no PFO or thrombus.  Dr. Roda Shutters felt the stroke was likely due to small vessel disease but atypical and recommended DAPT x3 weeks followed by aspirin alone.  Coagulopathy panel ordered for work-up.  She continued to be limited by left-sided weakness with numbness, likely instability with fatigue as well as balance deficits.  CIR was recommended due to functional decline.   Hospital Course: Skyleigh Windle was admitted to rehab 06/28/2021 for inpatient therapies to consist of PT and OT at least three hours five days a week. Past admission physiatrist, therapy team and  rehab RN have worked together to provide customized collaborative inpatient rehab.  She was maintained on B12 supplements for low to borderline B12 levels.  Her blood pressures were monitored on TID basis and have been stable.  She was started on bowel program to help manage slow transit constipation and has been educated on appropriate foods to help manage this.  Follow-up check of electrolytes showed renal status to be within normal limits.  Serial check of CBC shows H&H with mild drop without signs of bleeding.  Recommend repeat CBC on outpatient basis. Topamax was added to help manage HA with good results.  She has made good gains during her rehab stay and is at modified independent level.  She will continue to receive follow-up outpatient PT and ST at [facility] after discharge.   Rehab course: During patient's stay in rehab weekly team conferences were held to monitor patient's progress, set goals and discuss barriers to discharge. At admission, patient required CGA to supervision for mobility and supervision with ADL tasks. Speech therapy evaluation revealed mild deficits in short-term memory with appropriate awareness of deficits in general short form communication participation item Bank score 25/30.  Speech therapy recommended on outpatient basis. She  has had improvement in activity tolerance, balance, postural control as well as ability to compensate for deficits. She has had improvement in functional use LUE  and LLE as well as improvement in awareness.  She  is able to complete ADL/IADLs tasks at modified independent level.  She is modified independent for transfers and is able to ambulate 500' without assistive device.    Discharge disposition: 01-Home or Self Care  Diet: Monitor Carbs/Sweet intake  Special Instructions: Transition to driving per instructions. No strenuous activity or return to work till follow-up with MD. Repeat CBC in 1-2 weeks to monitor H/H.    Discharge Instructions     Ambulatory referral to Neurology   Complete by: As directed    An appointment is requested in approximately: 4 weeks   Ambulatory referral to Physical Medicine Rehab   Complete by: As directed       Allergies as of 07/02/2021       Reactions   Peanut-containing Drug Products Itching        Medication List     TAKE these medications    acetaminophen 325 MG tablet Commonly known as: TYLENOL Take 1-2 tablets (325-650 mg total) by mouth every 4 (four) hours as needed for mild pain. What changed:  how much to take reasons to take this   aspirin 81 MG chewable tablet Chew 1 tablet (81 mg total) by mouth daily. Notes to patient: Over the counter.    atorvastatin 40 MG tablet Commonly known as: LIPITOR Take 1 tablet (40 mg total) by mouth daily.   cetirizine 10 MG tablet Commonly known as: ZYRTEC Take 10 mg by mouth daily as needed for allergies.   clopidogrel 75 MG tablet Commonly known as: PLAVIX Take 1 tablet (75 mg total) by mouth daily.   cyanocobalamin 1000 MCG tablet Take 1 tablet (1,000 mcg total) by mouth daily.   ferrous sulfate 325 (65 FE) MG tablet Take 1 tablet (325 mg total) by mouth daily with breakfast. What changed: when to take this   magnesium gluconate 500 MG tablet Commonly known as: MAGONATE Take 0.5 tablets (250 mg total) by mouth at bedtime.   multivitamin with minerals Tabs tablet Take 1 tablet by mouth daily after supper.   polyethylene glycol 17 g packet Commonly known as: MIRALAX / GLYCOLAX Take 17 g by mouth daily.   senna-docusate 8.6-50 MG tablet Commonly known as: Senokot-S Take 2 tablets by mouth 2 (two) times daily. What changed: how much to take Notes to patient: Available over the counter   topiramate 50 MG tablet Commonly known as: TOPAMAX Take 1 tablet (50 mg total) by mouth at bedtime.        Follow-up Information     Raulkar, Drema Pry, MD Follow up.   Specialty: Physical  Medicine and Rehabilitation Why: 07/31/21 please arrive at 1:20pm for 1:40pm appointment, thank you! Contact information: [address] [place] [address] [phone]         Sherrie Mustache T, PA-C Follow up.   Specialty: Physician Assistant Why: call for follow up appt Contact information: [address] [place] [address] [phone]         [facility] Follow up.   Why: office will call you with follow up appointment Contact information: [address]     [address] [place] [address] [phone]                Signed: Jacquelynn Cree 07/03/2021, 8:09 PM

## 2021-07-02 NOTE — Anesthesia Postprocedure Evaluation (Signed)
Anesthesia Post Note  Patient: Danielle Kelley  Procedure(s) Performed: TRANSESOPHAGEAL ECHOCARDIOGRAM (TEE) BUBBLE STUDY     Patient location during evaluation: Endoscopy Anesthesia Type: MAC Level of consciousness: awake and alert Pain management: pain level controlled Vital Signs Assessment: post-procedure vital signs reviewed and stable Respiratory status: spontaneous breathing, nonlabored ventilation, respiratory function stable and patient connected to nasal cannula oxygen Cardiovascular status: stable and blood pressure returned to baseline Postop Assessment: no apparent nausea or vomiting Anesthetic complications: no   No notable events documented.  Last Vitals:  Vitals:   06/28/21 0853 06/28/21 1128  BP: (!) 149/98 139/79  Pulse: 68 78  Resp:    Temp: 36.6 C 36.7 C  SpO2: 100% 100%    Last Pain:  Vitals:   06/28/21 1128  TempSrc: Oral  PainSc: 0-No pain                 Markell Schrier

## 2021-07-02 NOTE — Anesthesia Preprocedure Evaluation (Signed)
Anesthesia Evaluation  Patient identified by MRN, date of birth, ID band Patient awake    Reviewed: Allergy & Precautions, NPO status , Patient's Chart, lab work & pertinent test results  History of Anesthesia Complications Negative for: history of anesthetic complications  Airway Mallampati: III  TM Distance: >3 FB Neck ROM: Full    Dental  (+) Dental Advisory Given, Teeth Intact   Pulmonary neg pulmonary ROS,    breath sounds clear to auscultation       Cardiovascular negative cardio ROS   Rhythm:Regular     Neuro/Psych CVA negative psych ROS   GI/Hepatic negative GI ROS, Neg liver ROS,   Endo/Other  Morbid obesity  Renal/GU negative Renal ROS     Musculoskeletal negative musculoskeletal ROS (+)   Abdominal   Peds  Hematology  (+) anemia ,   Anesthesia Other Findings   Reproductive/Obstetrics                             Anesthesia Physical Anesthesia Plan  ASA: 3  Anesthesia Plan: MAC   Post-op Pain Management:    Induction: Intravenous  PONV Risk Score and Plan: 2 and Propofol infusion and Treatment may vary due to age or medical condition  Airway Management Planned: Nasal Cannula  Additional Equipment: None  Intra-op Plan:   Post-operative Plan:   Informed Consent: I have reviewed the patients History and Physical, chart, labs and discussed the procedure including the risks, benefits and alternatives for the proposed anesthesia with the patient or authorized representative who has indicated his/her understanding and acceptance.     Dental advisory given  Plan Discussed with: CRNA and Anesthesiologist  Anesthesia Plan Comments:         Anesthesia Quick Evaluation

## 2021-07-02 NOTE — Progress Notes (Signed)
PROGRESS NOTE   Subjective/Complaints: No complaints this morning Ready for d/c today Provided with lists of foods for constipation, weight loss, and pin    ROS:  Pt denies SOB, abd pain, CP, N/V/C/D, and vision changes, + headaches, +constipation   Objective:   No results found. Recent Labs    07/01/21 0619  WBC 6.3  HGB 10.8*  HCT 33.4*  PLT 411*   Recent Labs    07/01/21 0619  NA 137  K 3.5  CL 107  CO2 24  GLUCOSE 90  BUN <5*  CREATININE 0.73  CALCIUM 8.7*    Intake/Output Summary (Last 24 hours) at 07/02/2021 0933 Last data filed at 07/02/2021 0700 Gross per 24 hour  Intake 240 ml  Output --  Net 240 ml        Physical Exam: Vital Signs Blood pressure 134/85, pulse 67, temperature 98.8 F (37.1 C), temperature source Oral, resp. rate 18, height 5\' 10"  (1.778 m), weight (!) 143.3 kg, last menstrual period 06/11/2021, SpO2 99 %. Gen: no distress, normal appearing HEENT: oral mucosa pink and moist, NCAT Cardio: Reg rate Chest: normal effort, normal rate of breathing Abd: soft, non-distended Ext: no edema Psych: pleasant, normal affect  Musculoskeletal:        General: No swelling or tenderness. Normal range of motion.     Cervical back: Normal range of motion and neck supple.     Right lower leg: No edema.     Left lower leg: No edema.  Skin:    General: Skin is warm and dry.  Neurological:     Mental Status: She is alert.     Comments: Alert and oriented x 3. Normal insight and awareness. Intact Memory. Normal language and speech. Cranial nerve exam unremarkable except for left central 7. RUE and RLE 5/5. LUE 4/5 prox to 3/5 distally at wrist, HI. LLE 2 to 2+/5 prox to 1+ to 2/5 ADF/PF. Mild loss of LT/PP in left foot, less moving proximally. Otherwise no sensory deficits. No resting tone, DTR's 1+ to 2+.    Assessment/Plan: 1. Functional deficits which require 3+ hours per day of  interdisciplinary therapy in a comprehensive inpatient rehab setting. Physiatrist is providing close team supervision and 24 hour management of active medical problems listed below. Physiatrist and rehab team continue to assess barriers to discharge/monitor patient progress toward functional and medical goals  Care Tool:  Bathing    Body parts bathed by patient: Right arm, Right lower leg, Left lower leg, Left arm, Chest, Face, Abdomen, Front perineal area, Buttocks, Right upper leg, Left upper leg         Bathing assist Assist Level: Independent     Upper Body Dressing/Undressing Upper body dressing   What is the patient wearing?: Hospital gown only    Upper body assist Assist Level: Independent    Lower Body Dressing/Undressing Lower body dressing      What is the patient wearing?: Underwear/pull up     Lower body assist Assist for lower body dressing: Independent     Toileting Toileting    Toileting assist Assist for toileting: Independent     Transfers Chair/bed transfer  Transfers  assist     Chair/bed transfer assist level: Independent     Locomotion Ambulation   Ambulation assist   Ambulation activity did not occur: Safety/medical concerns  Assist level: Independent Assistive device: No Device Max distance: 500   Walk 10 feet activity   Assist     Assist level: Independent Assistive device: No Device   Walk 50 feet activity   Assist    Assist level: Independent Assistive device: No Device    Walk 150 feet activity   Assist    Assist level: Independent Assistive device: No Device    Walk 10 feet on uneven surface  activity   Assist     Assist level: Independent Assistive device: Other (comment) (none)   Wheelchair     Assist Is the patient using a wheelchair?: No             Wheelchair 50 feet with 2 turns activity    Assist            Wheelchair 150 feet activity     Assist           Blood pressure 134/85, pulse 67, temperature 98.8 F (37.1 C), temperature source Oral, resp. rate 18, height 5\' 10"  (1.778 m), weight (!) 143.3 kg, last menstrual period 06/11/2021, SpO2 99 %.  Medical Problem List and Plan: 1.  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  Impaired mobility, ambulating >650 feet: discontinue Lovenox             -antiplatelet therapy: DAPT X 17 more days followed by ASA alone 3. Headaches/Pain Management: increase topamax to 50mg . Provided with list of foods for pain 4. Mood: LCSW to follow for evaluation and suppor.t d             -antipsychotic agents: N/A 5. Neuropsych: This patient is capable of making decisions on her own behalf. 6. Skin/Wound Care: Routine pressure relief measures.  7. Fluids/Electrolytes/Nutrition: Monitor I/O. Check lytes in am.  8. Pre-diabetes: Hgb A1c-5.9. Will add CM restrictions.  9. Vitamin B 12 deficiency: B12 level- 141   10. Dyslipidemia: LDL 120-->now on lipitor.  11. Morbid obesity s/p gastric sleeve: Will check Vitamin B1 level, Mg and Vit D to rule out deficiency --start multivitamin.  -needs further education on diet to help reduce stroke risk and manage weight 12. Slow transit constipation:             -senna-s 2 tablets bid             -miralax daily             -sorbitol prn  -provided list of foods for constipation   8/27- LBM last night and again today 13. Obesity BMI 45.33: provided dietary counseling, continue topamax. Provided list of foods for weight loss.  14. Dispo  8/28- due to pt's birthday, will allow grounds pass today, etc-    >30 minutes spent in discharge of patient including review of medications and follow-up appointments, physical examination, and in answering all patient's questions   LOS: 4 days A FACE TO FACE EVALUATION WAS PERFORMED  9/27 Roshon Duell 07/02/2021, 9:33 AM

## 2021-07-02 NOTE — Patient Care Conference (Signed)
Inpatient RehabilitationTeam Conference and Plan of Care Update Date: 07/02/2021   Time: 10:16 AM    Patient Name: Danielle Kelley      Medical Record Number: [id number]  Date of Birth: 1981/02/19 Sex: Female         Room/Bed: 4W26C/4W26C-01 Payor Info: Payor: BLUE CROSS BLUE SHIELD / Plan: BCBS COMM PPO / Product Type: *No Product type* /    Admit Date/Time:  06/28/2021  3:18 PM  Primary Diagnosis:  Acute CVA (cerebrovascular accident) [facility])  Hospital Problems: Principal Problem:   Acute CVA (cerebrovascular accident) [facility]) Active Problems:   Stroke (cerebrum) [facility])    Expected Discharge Date: Expected Discharge Date: 07/02/21  Team Members Present: Physician leading conference: Dr. Sula Soda Social Worker Present: Dossie Der, LCSW Nurse Present: Chana Bode, RN PT Present: Wynelle Link, PT OT Present: Dolphus Jenny, OT SLP Present: Colin Benton, SLP     Current Status/Progress Goal Weekly Team Focus  Bowel/Bladder   Continent of bowel/bladder with Mod i  Remain continent of bowel/bladder  assess bowel/bladder function q shift and as needed   Swallow/Nutrition/ Hydration             ADL's             Mobility   At goal level = indep. Ambulating >526ft, navigating up/down 12 stairs.  indep  DC planning, home safety, LLE NMR, gait training   Communication   Mod I  Eval Only due to ELOS  N/A   Safety/Cognition/ Behavioral Observations  Mod I  Eval Only due to ELOS  N/A   Pain   no pain issue reported  no pain  assess and treat pain q shift and as needed   Skin   no skin issue noted  skin to remain intact with mod I  assess skin q shift and as needed     Discharge Planning:  Home with Mom whois there but an not assist and her two teenage children-15 & 16. Set up for OP therapies.   Team Discussion: Headache post CVA addressed with increased Topamax dosage. Constipation addressed. Discussed restart driving with MD as mother post stroke unable to  drive and two teens at home without licenses.  Patient on target to meet rehab goals: yes, currently independent with IADLs and showering.  Mod I for PT able to ambulate up to 500' without ans assistive device.   *See Care Plan and progress notes for long and short-term goals.   Revisions to Treatment Plan:    Teaching Needs: Safety, medications, secondary stroke risk management, etc  Current Barriers to Discharge: Decreased caregiver support and Home enviroment access/layout  Possible Resolutions to Barriers: Family education ATP for coordination/shoulder strengthening OP follow up for knee stability, strengthening, and SLP for high level cognition activities, word finding and divided attention.     Medical Summary Current Status: obesity (BMI 45.33), daily headaches since stroke, left knee instability, slow transit constipation, suboptimal magnesium, insomnia  Barriers to Discharge: Medical stability;Weight  Barriers to Discharge Comments: obesity (BMI 45.33), daily headaches since stroke, left knee instability, slow transit constipation, suboptimal magnesium, insomnia Possible Resolutions to [employer]: provided list of food and dietary education, continue topamax 50mg , ordered neoprene knee sleeve, started magnesium supplement   Continued Need for Acute Rehabilitation Level of Care: The patient requires daily medical management by a physician with specialized training in physical medicine and rehabilitation for the following reasons: Direction of a multidisciplinary physical rehabilitation program to maximize functional independence : Yes Medical  management of patient stability for increased activity during participation in an intensive rehabilitation regime.: Yes Analysis of laboratory values and/or radiology reports with any subsequent need for medication adjustment and/or medical intervention. : Yes   I attest that I was present, lead the team conference, and concur  with the assessment and plan of the team.   Chana Bode B 07/02/2021, 1:23 PM

## 2021-07-02 NOTE — Progress Notes (Signed)
Orthopedic Tech Progress Note Patient Details:  Danielle Kelley 1981-03-24 [id number]  Ortho Devices Type of Ortho Device: Knee Sleeve Ortho Device/Splint Location: LLE Ortho Device/Splint Interventions: Ordered, Application, Adjustment   Post Interventions Patient Tolerated: Well Instructions Provided: Care of device  [name] 07/02/2021, 11:07 AM

## 2021-07-02 NOTE — Progress Notes (Signed)
Patient d/c home  with family member. A&O x4 at the time of d/c. Denied pain or discomfort. Transported to the lobby in wheelchair. We continue to monitor.

## 2021-07-03 DIAGNOSIS — E538 Deficiency of other specified B group vitamins: Secondary | ICD-10-CM

## 2021-07-03 DIAGNOSIS — R519 Headache, unspecified: Secondary | ICD-10-CM

## 2021-07-03 LAB — ANTINUCLEAR ANTIBODIES, IFA: ANA Ab, IFA: NEGATIVE

## 2021-07-04 ENCOUNTER — Telehealth: Payer: Self-pay | Admitting: Physician Assistant

## 2021-07-04 LAB — VITAMIN B1: Vitamin B1 (Thiamine): 102.9 nmol/L (ref 66.5–200.0)

## 2021-07-04 NOTE — Telephone Encounter (Signed)
   Danielle Kelley DOB: 01/26/1981 MRN: [id number]   RIDER WAIVER AND RELEASE OF LIABILITY  For purposes of improving physical access to our facilities, Danielle Kelley is pleased to partner with third parties to provide Danielle Kelley patients or other authorized individuals the option of convenient, on-demand ground transportation services (the [employer]") through use of the technology service that enables users to request on-demand ground transportation from independent third-party providers.  By opting to use and accept these Danielle Kelley, I, the undersigned, hereby agree on behalf of myself, and on behalf of any minor child using the Danielle Kelley for whom I am the parent or legal guardian, as follows:  Danielle Kelley provided to me are provided by independent third-party transportation providers who are not Danielle Kelley or employees and who are unaffiliated with Danielle Kelley. Danielle Kelley is neither a transportation carrier nor a common or public carrier. Danielle Kelley has no control over the quality or safety of the transportation that occurs as a result of the Danielle Kelley. Danielle Kelley cannot guarantee that any third-party transportation provider will complete any arranged transportation service. Danielle Kelley makes no representation, warranty, or guarantee regarding the reliability, timeliness, quality, safety, suitability, or availability of any of the Transport Services or that they will be error free. I fully understand that traveling by vehicle involves risks and dangers of serious bodily injury, including permanent disability, paralysis, and death. I agree, on behalf of myself and on behalf of any minor child using the Transport Services for whom I am the parent or legal guardian, that the entire risk arising out of my use of the Danielle Kelley remains solely with me, to the maximum extent permitted under applicable law. The Danielle Kelley are provided "as  is" and "as available." Danielle Kelley disclaims all representations and warranties, express, implied or statutory, not expressly set out in these terms, including the implied warranties of merchantability and fitness for a particular purpose. I hereby waive and release Danielle Kelley, its agents, employees, officers, directors, representatives, insurers, attorneys, assigns, successors, subsidiaries, and affiliates from any and all past, present, or future claims, demands, liabilities, actions, causes of action, or suits of any kind directly or indirectly arising from acceptance and use of the Danielle Kelley. I further waive and release Danielle Kelley and its affiliates from all present and future liability and responsibility for any injury or death to persons or damages to property caused by or related to the use of the Danielle Kelley. I have read this Waiver and Release of Liability, and I understand the terms used in it and their legal significance. This Waiver is freely and voluntarily given with the understanding that my right (as well as the right of any minor child for whom I am the parent or legal guardian using the Danielle Kelley) to legal recourse against [place] in connection with the Danielle Kelley is knowingly surrendered in return for use of these services.   I attest that I read the consent document to Danielle Kelley, gave Danielle Kelley the opportunity to ask questions and answered the questions asked (if any). I affirm that Danielle Kelley then provided consent for she's participation in this program.     Danielle Kelley

## 2021-07-10 ENCOUNTER — Other Ambulatory Visit: Payer: Self-pay

## 2021-07-10 ENCOUNTER — Ambulatory Visit: Payer: BC Managed Care – PPO | Attending: Physical Medicine and Rehabilitation

## 2021-07-10 DIAGNOSIS — R2681 Unsteadiness on feet: Secondary | ICD-10-CM | POA: Diagnosis present

## 2021-07-10 DIAGNOSIS — M6281 Muscle weakness (generalized): Secondary | ICD-10-CM | POA: Insufficient documentation

## 2021-07-10 DIAGNOSIS — I639 Cerebral infarction, unspecified: Secondary | ICD-10-CM | POA: Insufficient documentation

## 2021-07-10 DIAGNOSIS — R2689 Other abnormalities of gait and mobility: Secondary | ICD-10-CM | POA: Insufficient documentation

## 2021-07-10 DIAGNOSIS — I69354 Hemiplegia and hemiparesis following cerebral infarction affecting left non-dominant side: Secondary | ICD-10-CM | POA: Diagnosis not present

## 2021-07-10 NOTE — Therapy (Signed)
[facility] [address] [place], [place], [address] Phone: [phone]   Fax:  [phone]  Physical Therapy Evaluation  Patient Details  Name: Danielle Kelley MRN: [id number] Date of Birth: 01-17-1981 Referring Provider (PT): Carlis Abbott Drema Pry, MD   Encounter Date: 07/10/2021   PT End of Session - 07/10/21 1537     Visit Number 1    Date for PT Re-Evaluation 09/04/21    Authorization Type Blue Cross Blue E    PT Start Time 1345    PT Stop Time 1428    PT Time Calculation (min) 43 min    Activity Tolerance Patient tolerated treatment well;Patient limited by fatigue    Behavior During Therapy [facility] for tasks assessed/performed   slighlty tearful regarding current status and challenges with fatigue            Past Medical History:  Diagnosis Date   Obesity    Seasonal allergies     Past Surgical History:  Procedure Laterality Date   ABDOMINAL SURGERY  04/06/2017   Gastric sleeve   BUBBLE STUDY N/A 06/26/2021   Procedure: BUBBLE STUDY;  Surgeon: Jodelle Red, MD;  Location: [facility] ENDOSCOPY;  Service: Cardiovascular;  Laterality: N/A;   CESAREAN SECTION     2004, 2006, 2007.   TEE WITHOUT CARDIOVERSION N/A 06/26/2021   Procedure: TRANSESOPHAGEAL ECHOCARDIOGRAM (TEE);  Surgeon: Jodelle Red, MD;  Location: [facility] ENDOSCOPY;  Service: Cardiovascular;  Laterality: N/A;    There were no vitals filed for this visit.    Subjective Assessment - 07/10/21 1349     Subjective Stroke morning of August 22nd. Presented to ED s with L sided weakness and paresthesias. 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  PMH: gastric bypass sx. Wearing a soft knee brace for stability. pt is new to area, no friends or family here. had someone helping that came from out of state and pt felt okay but does feel like she has overdone it.  Does report alot of fatigue and some unsteadiness with  balance.    Patient Stated Goals better balance/feel more steady, feel stronger, not as tired.    Currently in Pain? No/denies                [facility] PT Assessment - 07/10/21 1348       Assessment   Medical Diagnosis I63.9 (ICD-10-CM) - Cerebral infarction, unspecified    Referring Provider (PT) Carlis Abbott, Drema Pry, MD    Hand Dominance Right    Prior Therapy inpatient rehab/acute post stroke      Home Environment   Additional Comments pt is a single mother and has an 42 yo, 81 and 33 yo who help with basic household things. Pt is new to this area.  Reports father of children lives in [place] but visits frequently and helps. House, 4 stpes to enter and 13 inside, rooms and full bathroom upstairs.      Prior Function   Level of Independence Independent    Vocation Full time employment    Vocation Requirements works in [employer]   Gross Motor Movements are Fluid and Coordinated Yes    Finger Nose Finger Test intact      Posture/Postural Control   Posture Comments slighlty kyphotic. Left lateral trunk lean occurs wiht increased fatigue.      ROM / Strength   AROM / PROM / Strength Strength      Strength  Overall Strength Comments grossly 3+/5 limited by mms fatigue for the LUE. decreased funcitonal core strength with left trunk lean with fatigue    Left Hip Flexion 3-/5    Left Hip ABduction 3-/5    Left Hip ADduction 3-/5    Left Knee Flexion 4+/5      Transfers   Five time sit to stand comments  36 seconds - intermittent UE support, left leg in front, very fatigued.      Ambulation/Gait   Gait Comments slight limp, left LE extension with slight circumduction, decreased stance time and SLS      Standardized Balance Assessment   Standardized Balance Assessment Berg Balance Test;Timed Up and Go Test      Berg Balance Test   Sit to Stand Able to stand  independently using hands    Standing Unsupported Able to stand 2 minutes with supervision    Sitting with  Back Unsupported but Feet Supported on Floor or Stool Able to sit safely and securely 2 minutes    Stand to Sit Controls descent by using hands    Transfers Able to transfer safely, definite need of hands    Standing Unsupported with Eyes Closed Able to stand 10 seconds with supervision    Standing Unsupported with Feet Together Able to place feet together independently but unable to hold for 30 seconds    From Standing, Reach Forward with Outstretched Arm Can reach forward >5 cm safely (2")    From Standing Position, Pick up Object from Floor Able to pick up shoe, needs supervision    From Standing Position, Turn to Look Behind Over each Shoulder Looks behind one side only/other side shows less weight shift   unsecure looking to the left   Turn 360 Degrees Able to turn 360 degrees safely one side only in 4 seconds or less    Standing Unsupported, Alternately Place Feet on Step/Stool Able to complete 4 steps without aid or supervision    Standing Unsupported, One Foot in [employer] Loses balance while stepping or standing    Standing on One Leg Unable to try or needs assist to prevent fall    Total Score 34      Timed Up and Go Test   Normal TUG (seconds) 21   no AD, LLE straight, circumducting, sligh limp                       Objective measurements completed on examination: See above findings.           PT Education - 07/10/21 1536     Education Details PT initial POC and HEP. Access Code: [email or web address]  with emphasis on UE/LE strength working on endurance as well, breaks as needed and progressing 1 to 2 sets as fatigue improves. Piriformis stretch shown for sciatic like L glute pain and provided some relief, added to HEP.    Person(s) Educated Patient    Methods Explanation;Demonstration    Comprehension Verbalized understanding;Returned demonstration              PT Short Term Goals - 07/10/21 1538       PT SHORT TERM GOAL #1   Title Independent with initial HEP     Time 2    Period Weeks    Status New    Target Date 07/24/21               PT Long Term Goals - 07/10/21 1538  PT LONG TERM GOAL #1   Title Independent with advanced HEP    Time 8    Period Weeks    Status New    Target Date 09/04/21      PT LONG TERM GOAL #2   Title 5TSTS improved to </= 12 seconds with symmetrical alignment and no UE support to demonstrate improved functional strength, endurance.    Baseline 36 sec, asymmetrical stance and fatigued.    Time 8    Period Weeks    Status New    Target Date 09/04/21      PT LONG TERM GOAL #3   Title TUG improved to </= 10 seconds with improved continuity of steps, no LOB, and minimal fatigue reported    Baseline 21 seconds, fatigued, asymmetrical gait    Time 8    Period Weeks    Status New    Target Date 09/04/21      PT LONG TERM GOAL #4   Title BERG improved to at least 50/56 to demonstrate improved balance, decreased falls risk    Baseline 34/56 with reports of feeling unsure about balance    Time 8    Period Weeks    Status New    Target Date 09/04/21      PT LONG TERM GOAL #5   Title LUE and LLE strength to at least 4+/5    Time 8    Period Weeks    Status New    Target Date 09/04/21                    Plan - 07/10/21 1542     Clinical Impression Statement Pt is a kind 40 yo female who was reffered for OPPT evaluation s/p cerebral infarct. Pt reports stroke morning of August 22nd. with L sided weakness and paresthesias. At hospital,  MRI showed acute infarct in the periventricular R posterior frontal corona radiata. PMH: gastric bypass sx. Pt was given a neoprene knee brace with stiff laterals in the hospital for stability of left knee.  Pt currently presents with decreased LUE and LLE strength, significant overall fatigue/diminished cardiovascualr and muscular endurance, abnormal posture, abnormal gait and decreased balance. As per the TUG and BERG she is a falls risk at this time.  Pt  is new to this area, reports no friends or family here, and has 3 teenaged children that she cares for and expresses that the fatigue and unsteadiness have had a big impact on her since discharge from hospital making daily activities challenging. She will benfit from skilled PT to work on improving endurance, functional strength and balance to work on decreasing fatigue and improve stability with daily tasks.    Stability/Clinical Decision Making Evolving/Moderate complexity    Clinical Decision Making Moderate    Rehab Potential Good    PT Frequency 1x / week    PT Duration 8 weeks    PT Treatment/Interventions ADLs/Self Care Home Management;Electrical Stimulation;Moist Heat;Gait training;Functional mobility training;Stair training;Therapeutic activities;Therapeutic exercise;Balance training;Neuromuscular re-education;Patient/family education;Manual techniques;Energy conservation;Taping;Vestibular    PT Next Visit Plan Reassess and progress HEP as appropriate. Work on initiating exercises for overall strength working on gradually increasing repetitions as fatigue improves. can monitor VS pre and post activity to assess tolerance. Balance and gait, LLE stability exercises. Can also further assess and work on control with stairs.    PT Home Exercise Plan see pt instructions    Consulted and Agree with Plan of Care Patient  Patient will benefit from skilled therapeutic intervention in order to improve the following deficits and impairments:  Abnormal gait, Difficulty walking, Decreased endurance, Decreased activity tolerance, Decreased balance, Decreased mobility, Decreased strength, Postural dysfunction  Visit Diagnosis: Hemiplegia and hemiparesis following cerebral infarction affecting left non-dominant side (HCC) - Plan: PT plan of care cert/re-cert  Muscle weakness (generalized) - Plan: PT plan of care cert/re-cert  Other abnormalities of gait and mobility - Plan: PT plan of care  cert/re-cert  Unsteadiness on feet - Plan: PT plan of care cert/re-cert     Problem List Patient Active Problem List   Diagnosis Date Noted   Vitamin B 12 deficiency 07/03/2021   Morbid obesity (HCC) 07/03/2021   New onset of headaches 07/03/2021   Stroke (cerebrum) (HCC) 06/28/2021   Iron deficiency anemia    Seasonal allergies    Acute CVA (cerebrovascular accident) (HCC) 06/25/2021   Cerebrovascular accident (CVA) (HCC) 06/24/2021    Anson Crofts, PT, DPT 07/10/2021, 3:53 PM  [facility] [address] [place], [place], [address] Phone: [phone]   Fax:  [phone]  Name: Deiona Hooper MRN: [id number] Date of Birth: [birth year]

## 2021-07-10 NOTE — Patient Instructions (Signed)
Access Code: [email or web address] URL: [email or web address] Date: 07/10/2021 Prepared by: Claude Manges  Program Notes Complete exercises at least 4x/wk  red band for arm exercise light green loop band around thighs for bridges start with 1 set of each exercise, as fatigue improves work up to 2 sets   Exercises Sit to Stand - 1 x daily - 7 x weekly - 1-2 sets - 10 reps Standing Shoulder Horizontal Abduction with Resistance - 1 x daily - 7 x weekly - 1-2 sets - 10 reps Standing Bicep Curls Supinated with Dumbbells - 1 x daily - 7 x weekly - 1-2 sets - 10 reps Standing Shoulder Scaption - 1 x daily - 7 x weekly - 1-2 sets - 10 reps Supine March - 1 x daily - 7 x weekly - 1-2 sets - 10 reps Supine Heel Slide - 1 x daily - 7 x weekly - 1-2 sets - 10 reps Supine Bridge with Resistance Band - 1 x daily - 7 x weekly - 1-2 sets - 5-10 reps Supine Piriformis Stretch with Foot on Ground - 1 x daily - 7 x weekly - 4 sets - 30 seconds hold

## 2021-07-22 ENCOUNTER — Encounter: Payer: Self-pay | Admitting: Rehabilitative and Restorative Service Providers"

## 2021-07-22 ENCOUNTER — Other Ambulatory Visit: Payer: Self-pay

## 2021-07-22 ENCOUNTER — Ambulatory Visit: Payer: BC Managed Care – PPO | Admitting: Rehabilitative and Restorative Service Providers"

## 2021-07-22 DIAGNOSIS — M6281 Muscle weakness (generalized): Secondary | ICD-10-CM

## 2021-07-22 DIAGNOSIS — I69354 Hemiplegia and hemiparesis following cerebral infarction affecting left non-dominant side: Secondary | ICD-10-CM

## 2021-07-22 DIAGNOSIS — R2681 Unsteadiness on feet: Secondary | ICD-10-CM

## 2021-07-22 DIAGNOSIS — I639 Cerebral infarction, unspecified: Secondary | ICD-10-CM

## 2021-07-22 DIAGNOSIS — R2689 Other abnormalities of gait and mobility: Secondary | ICD-10-CM

## 2021-07-22 NOTE — Therapy (Signed)
[facility] [address] [place], [place], [address] Phone: [phone]   Fax:  [phone]  Physical Therapy Treatment  Patient Details  Name: Danielle Kelley MRN: [id number] Date of Birth: January 22, 1981 Referring Provider (PT): Carlis Abbott Drema Pry, MD   Encounter Date: 07/22/2021   PT End of Session - 07/22/21 1151     Visit Number 2    Date for PT Re-Evaluation 09/04/21    Authorization Type Blue Cross Blue E    PT Start Time 1145    PT Stop Time 1225    PT Time Calculation (min) 40 min    Activity Tolerance Patient tolerated treatment well    Behavior During Therapy [facility] for tasks assessed/performed             Past Medical History:  Diagnosis Date   Obesity    Seasonal allergies     Past Surgical History:  Procedure Laterality Date   ABDOMINAL SURGERY  04/06/2017   Gastric sleeve   BUBBLE STUDY N/A 06/26/2021   Procedure: BUBBLE STUDY;  Surgeon: Jodelle Red, MD;  Location: [facility] ENDOSCOPY;  Service: Cardiovascular;  Laterality: N/A;   CESAREAN SECTION     2004, 2006, 2007.   TEE WITHOUT CARDIOVERSION N/A 06/26/2021   Procedure: TRANSESOPHAGEAL ECHOCARDIOGRAM (TEE);  Surgeon: Jodelle Red, MD;  Location: [facility] ENDOSCOPY;  Service: Cardiovascular;  Laterality: N/A;    There were no vitals filed for this visit.   Subjective Assessment - 07/22/21 1150     Subjective Pt reports that she has been doing her HEP, states that the bands are helping.  She states that after approx 10 min, her knee gets tired and loses stability.    Patient Stated Goals better balance/feel more steady, feel stronger, not as tired.    Currently in Pain? No/denies                               [facility] Adult PT Treatment/Exercise - 07/22/21 0001       Exercises   Exercises Knee/Hip      Knee/Hip Exercises: Aerobic   Nustep L5 x6 min      Knee/Hip Exercises: Seated   Long Arc Quad Strengthening;Both;2  sets;10 reps    Long Arc Quad Weight 3 lbs.    Clamshell with TheraBand Red   2x10   Marching Strengthening;Both;2 sets;10 reps    Marching Weights 3 lbs.    Hamstring Curl Strengthening;Both;2 sets;10 reps    Hamstring Limitations red tband    Sit to Sand 10 reps;without UE support   2x5 holding yellow wt ball                      PT Short Term Goals - 07/22/21 1244       PT SHORT TERM GOAL #1   Title Independent with initial HEP    Status Achieved               PT Long Term Goals - 07/22/21 1245       PT LONG TERM GOAL #1   Title Independent with advanced HEP    Status On-going      PT LONG TERM GOAL #2   Title 5TSTS improved to </= 12 seconds with symmetrical alignment and no UE support to demonstrate improved functional strength, endurance.    Status On-going      PT LONG TERM GOAL #3  Title TUG improved to </= 10 seconds with improved continuity of steps, no LOB, and minimal fatigue reported    Status On-going      PT LONG TERM GOAL #4   Title BERG improved to at least 50/56 to demonstrate improved balance, decreased falls risk    Status On-going      PT LONG TERM GOAL #5   Title LUE and LLE strength to at least 4+/5    Status On-going                   Plan - 07/22/21 1240     Clinical Impression Statement Pt tolerated session and ther ex well, but did state fatigue following.  She had difficulty with sit to/from stand and with HS curl the most.  Pt is reporting brain fogginess and was educated about Speech Therapy, she is interested in this service and is going to speak to her MD about a potential referral.  She continues with L knee instability and has difficulty walking.  She is hoping to improve her ambulation to allow her visit her oldest daughter at college.    PT Treatment/Interventions ADLs/Self Care Home Management;Electrical Stimulation;Moist Heat;Gait training;Functional mobility training;Stair training;Therapeutic  activities;Therapeutic exercise;Balance training;Neuromuscular re-education;Patient/family education;Manual techniques;Energy conservation;Taping;Vestibular    PT Next Visit Plan Reassess and progress HEP as appropriate. Work on initiating exercises for overall strength working on gradually increasing repetitions as fatigue improves. can monitor VS pre and post activity to assess tolerance. Balance and gait, LLE stability exercises. Can also further assess and work on control with stairs.    Consulted and Agree with Plan of Care Patient             Patient will benefit from skilled therapeutic intervention in order to improve the following deficits and impairments:  Abnormal gait, Difficulty walking, Decreased endurance, Decreased activity tolerance, Decreased balance, Decreased mobility, Decreased strength, Postural dysfunction  Visit Diagnosis: Muscle weakness (generalized)  Other abnormalities of gait and mobility  Hemiplegia and hemiparesis following cerebral infarction affecting left non-dominant side (HCC)  Unsteadiness on feet  Acute CVA (cerebrovascular accident) [facility])     Problem List Patient Active Problem List   Diagnosis Date Noted   Vitamin B 12 deficiency 07/03/2021   Morbid obesity (HCC) 07/03/2021   New onset of headaches 07/03/2021   Stroke (cerebrum) (HCC) 06/28/2021   Iron deficiency anemia    Seasonal allergies    Acute CVA (cerebrovascular accident) (HCC) 06/25/2021   Cerebrovascular accident (CVA) (HCC) 06/24/2021    Reather Laurence, PT, DPT 07/22/2021, 12:47 PM  [facility] [address] [place], [place], [address] Phone: [phone]   Fax:  [phone]  Name: Danielle Kelley MRN: [id number] Date of Birth: [birth year]

## 2021-07-29 ENCOUNTER — Other Ambulatory Visit: Payer: Self-pay

## 2021-07-29 ENCOUNTER — Encounter: Payer: Self-pay | Admitting: Rehabilitative and Restorative Service Providers"

## 2021-07-29 ENCOUNTER — Ambulatory Visit: Payer: BC Managed Care – PPO | Admitting: Rehabilitative and Restorative Service Providers"

## 2021-07-29 DIAGNOSIS — I69354 Hemiplegia and hemiparesis following cerebral infarction affecting left non-dominant side: Secondary | ICD-10-CM

## 2021-07-29 DIAGNOSIS — R2689 Other abnormalities of gait and mobility: Secondary | ICD-10-CM

## 2021-07-29 DIAGNOSIS — I639 Cerebral infarction, unspecified: Secondary | ICD-10-CM

## 2021-07-29 DIAGNOSIS — M6281 Muscle weakness (generalized): Secondary | ICD-10-CM

## 2021-07-29 DIAGNOSIS — R2681 Unsteadiness on feet: Secondary | ICD-10-CM

## 2021-07-29 NOTE — Therapy (Signed)
[facility] [place] [address] [place], [place], [address] Phone: [phone]   Fax:  [phone]  Physical Therapy Treatment  Patient Details  Name: Danielle Kelley MRN: [id number] Date of Birth: 06-04-1981 Referring Provider (PT): Carlis Abbott Drema Pry, MD   Encounter Date: 07/29/2021   PT End of Session - 07/29/21 1126     Visit Number 3    Date for PT Re-Evaluation 09/04/21    Authorization Type Blue Cross Blue E    PT Start Time 1101    PT Stop Time 1141    PT Time Calculation (min) 40 min    Activity Tolerance Patient tolerated treatment well    Behavior During Therapy [facility] for tasks assessed/performed             Past Medical History:  Diagnosis Date   Obesity    Seasonal allergies     Past Surgical History:  Procedure Laterality Date   ABDOMINAL SURGERY  04/06/2017   Gastric sleeve   BUBBLE STUDY N/A 06/26/2021   Procedure: BUBBLE STUDY;  Surgeon: Jodelle Red, MD;  Location: [facility] ENDOSCOPY;  Service: Cardiovascular;  Laterality: N/A;   CESAREAN SECTION     2004, 2006, 2007.   TEE WITHOUT CARDIOVERSION N/A 06/26/2021   Procedure: TRANSESOPHAGEAL ECHOCARDIOGRAM (TEE);  Surgeon: Jodelle Red, MD;  Location: [facility] ENDOSCOPY;  Service: Cardiovascular;  Laterality: N/A;    There were no vitals filed for this visit.   Subjective Assessment - 07/29/21 1125     Subjective Pt reports that she was able to go shopping in [employer] with a shopping cart instead of a moroized cart.  She states that she did not walk the entire store, but most of it.    Patient Stated Goals better balance/feel more steady, feel stronger, not as tired.    Currently in Pain? No/denies                               [facility] Adult PT Treatment/Exercise - 07/29/21 0001       High Level Balance   High Level Balance Comments Standing on AirEx, Standing on AirEx with eyes closed, Marching on AirEx x10 reps, Ball  toss standing on AirEx      Knee/Hip Exercises: Aerobic   Nustep L5 x6 min      Knee/Hip Exercises: Seated   Long Arc Quad Strengthening;Both;2 sets;10 reps    Long Arc Quad Weight 3 lbs.    Clamshell with TheraBand Red   2x10   Marching Strengthening;Both;2 sets;10 reps    Marching Weights 3 lbs.    Hamstring Curl Strengthening;Both;2 sets;10 reps    Hamstring Limitations red tband    Sit to Sand 10 reps;without UE support   2x5 holding yellow wt ball                      PT Short Term Goals - 07/22/21 1244       PT SHORT TERM GOAL #1   Title Independent with initial HEP    Status Achieved               PT Long Term Goals - 07/29/21 1148       PT LONG TERM GOAL #1   Title Independent with advanced HEP    Status On-going      PT LONG TERM GOAL #4   Title BERG improved to at least 50/56 to  demonstrate improved balance, decreased falls risk    Status On-going                   Plan - 07/29/21 1136     Clinical Impression Statement [name] continues to progress towards goal related activities.  She required less seated recovery periods during today's session. Pt had most difficulty with standing balance tasks on AirEx, especially dual task activities such as standing on mat and performing ball toss.    PT Treatment/Interventions ADLs/Self Care Home Management;Electrical Stimulation;Moist Heat;Gait training;Functional mobility training;Stair training;Therapeutic activities;Therapeutic exercise;Balance training;Neuromuscular re-education;Patient/family education;Manual techniques;Energy conservation;Taping;Vestibular    PT Next Visit Plan Reassess and progress HEP as appropriate. Work on initiating exercises for overall strength working on gradually increasing repetitions as fatigue improves. can monitor VS pre and post activity to assess tolerance. Balance and gait, LLE stability exercises. Can also further assess and work on control with stairs.     Consulted and Agree with Plan of Care Patient             Patient will benefit from skilled therapeutic intervention in order to improve the following deficits and impairments:  Abnormal gait, Difficulty walking, Decreased endurance, Decreased activity tolerance, Decreased balance, Decreased mobility, Decreased strength, Postural dysfunction  Visit Diagnosis: Muscle weakness (generalized)  Other abnormalities of gait and mobility  Hemiplegia and hemiparesis following cerebral infarction affecting left non-dominant side (HCC)  Unsteadiness on feet  Acute CVA (cerebrovascular accident) [facility])     Problem List Patient Active Problem List   Diagnosis Date Noted   Vitamin B 12 deficiency 07/03/2021   Morbid obesity (HCC) 07/03/2021   New onset of headaches 07/03/2021   Stroke (cerebrum) (HCC) 06/28/2021   Iron deficiency anemia    Seasonal allergies    Acute CVA (cerebrovascular accident) (HCC) 06/25/2021   Cerebrovascular accident (CVA) (HCC) 06/24/2021    Danielle Kelley 07/29/2021, 11:50 AM  [facility] [place] [facility] [address] [place], [place], [address] Phone: [phone]   Fax:  [phone]  Name: Danielle Kelley MRN: [id number] Date of Birth: [birth year]

## 2021-07-31 ENCOUNTER — Encounter
Payer: BC Managed Care – PPO | Attending: Physical Medicine and Rehabilitation | Admitting: Physical Medicine and Rehabilitation

## 2021-07-31 ENCOUNTER — Encounter: Payer: Self-pay | Admitting: Physical Medicine and Rehabilitation

## 2021-07-31 ENCOUNTER — Other Ambulatory Visit: Payer: Self-pay

## 2021-07-31 VITALS — BP 128/85 | HR 77 | Temp 98.2°F | Ht 70.0 in | Wt 302.8 lb

## 2021-07-31 DIAGNOSIS — R4189 Other symptoms and signs involving cognitive functions and awareness: Secondary | ICD-10-CM | POA: Diagnosis not present

## 2021-07-31 DIAGNOSIS — E538 Deficiency of other specified B group vitamins: Secondary | ICD-10-CM | POA: Insufficient documentation

## 2021-07-31 DIAGNOSIS — R519 Headache, unspecified: Secondary | ICD-10-CM | POA: Diagnosis not present

## 2021-07-31 DIAGNOSIS — I63431 Cerebral infarction due to embolism of right posterior cerebral artery: Secondary | ICD-10-CM | POA: Diagnosis not present

## 2021-07-31 DIAGNOSIS — D509 Iron deficiency anemia, unspecified: Secondary | ICD-10-CM | POA: Diagnosis not present

## 2021-07-31 MED ORDER — TOPIRAMATE 50 MG PO TABS: 50.0000 mg | ORAL_TABLET | Freq: Every day | ORAL | 0 refills | Status: DC

## 2021-07-31 MED ORDER — ATORVASTATIN CALCIUM 40 MG PO TABS: 40.0000 mg | ORAL_TABLET | Freq: Every day | ORAL | 0 refills | Status: DC

## 2021-07-31 NOTE — Progress Notes (Signed)
Subjective:    Patient ID: Danielle Kelley, female    DOB: Mar 26, 1981, 40 y.o.   MRN: [id number]  HPI Danielle Kelley is a 40 year old woman who presents for hospital follow-up after CIR admission for CVA.  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 Screens worsen her headaches.  -she is exhausted after bringing her kids home.   2) Morbid obesity -weight is 302 lbs today -she does intermittent fasting -she eats between 12 and 6 -she has started to walk outside with her kids  3) Headaches: -topamax is helping   Pain Inventory Average Pain 3 Pain Right Now 0 My pain is tingling and aching  LOCATION OF PAIN  head, knee  BOWEL Number of stools per week: 5 Oral laxative use No  Type of laxative na Enema or suppository use No  History of colostomy No  Incontinent No   BLADDER Normal In and out cath, frequency na Able to self cath  na Bladder incontinence No  Frequent urination No  Leakage with coughing No  Difficulty starting stream No  Incomplete bladder emptying No    Mobility how many minutes can you walk? 15 ability to climb steps?  yes do you drive?  yes  Function employed # of hrs/week .  Neuro/Psych weakness numbness tremor tingling trouble walking dizziness confusion  Prior Studies Hospital f/u  Physicians involved in your care Hospital f/u   Family History  Problem Relation Age of Onset   Stroke Mother    Hypertension Mother    Hypertension Maternal Grandmother    Hypertension Maternal Grandfather    Social History   Socioeconomic History   Marital status: Single    Spouse name: Not on file   Number of children: Not on file   Years of education: Not on file   Highest education level: Not on file   Occupational History   Not on file  Tobacco Use   Smoking status: Never   Smokeless tobacco: Never  Substance and Sexual Activity   Alcohol use: Not on file   Drug use: Not on file   Sexual activity: Not on file  Other Topics Concern   Not on file  Social History Narrative   Not on file   Social Determinants of Health   Financial Resource Strain: Not on file  Food Insecurity: Not on file  Transportation Needs: Not on file  Physical Activity: Not on file  Stress: Not on file  Social Connections: Not on file   Past Surgical History:  Procedure Laterality Date   ABDOMINAL SURGERY  04/06/2017   Gastric sleeve   BUBBLE STUDY N/A 06/26/2021   Procedure: BUBBLE STUDY;  Surgeon: Jodelle Red, MD;  Location: [facility] ENDOSCOPY;  Service: Cardiovascular;  Laterality: N/A;   CESAREAN SECTION     2004, 2006, 2007.   TEE WITHOUT CARDIOVERSION N/A 06/26/2021   Procedure: TRANSESOPHAGEAL ECHOCARDIOGRAM (TEE);  Surgeon: Jodelle Red, MD;  Location: [facility] ENDOSCOPY;  Service: Cardiovascular;  Laterality: N/A;   Past Medical History:  Diagnosis Date   Obesity    Seasonal allergies    BP 128/85   Pulse 77   Temp 98.2 F (36.8 C) (Oral)   Ht 5\' 10"  (1.778 m)   Wt )  302 lb 12.8 oz (137.3 kg)   SpO2 98%   BMI 43.45 kg/m   Opioid Risk Score:   Fall Risk Score:  `1  Depression screen PHQ 2/9  Depression screen PHQ 2/9 07/31/2021  Decreased Interest 0  Down, Depressed, Hopeless 0  PHQ - 2 Score 0  Altered sleeping 1  Tired, decreased energy 2  Change in appetite 1  Feeling bad or failure about yourself  0  Trouble concentrating 2  Moving slowly or fidgety/restless 2  Suicidal thoughts 0  PHQ-9 Score 8  Difficult doing work/chores Somewhat difficult     Review of Systems  Constitutional: Negative.   HENT: Negative.    Eyes: Negative.   Respiratory: Negative.    Endocrine: Negative.   Genitourinary: Negative.   Allergic/Immunologic: Negative.    Neurological: Negative.       Objective:   Physical Exam Gen: no distress, normal appearing HEENT: oral mucosa pink and moist, NCAT Cardio: Reg rate Chest: normal effort, normal rate of breathing Abd: soft, non-distended Ext: no edema Psych: pleasant, normal affect Skin: intact Neuro: Word finding difficulties, impaired attention    Assessment & Plan:  Danielle Kelley is a 40 year old woman who presents for hospital follow-up after CIR admission for CVA.  1) CVA -MRI Brain reviewed and shows a small acute infarct in the periventricular right posterior frontal corona radiata, immediately superior to and potentially involving the right thalamus. Faint edema without mass effect. MRA shows no large vessel occlusion or proximal hemodynamically significant stenosis. -continue therapies -continue using cane in stores to minimize fall risk.  -provided dietary and exercise counseling -discussed that hypertension is number one reversible risk factor for stroke.  -recommended NAC or Cefarolin NAC for fatigue and cognitive impairments and she plans to use latter.  -provided counseling regarding return to work, currently planning to return October 24th- at this time we discussed starting with half days to help minimize her screen time.  -Will complete her disability paperwork for her -Provided a note to   2) Obesity: -Educated that current weight is 302 lbs -continue intermittent fasting.  -Educated regarding health benefits of weight loss- for pain, general health, chronic disease prevention, immune health, mental health.  -Will monitor weight every visit.  -Consider Roobois tea daily.  -Discussed the benefits of intermittent fasting. -Discussed foods that can assist in weight loss: 1) leafy greens- high in fiber and nutrients 2) dark chocolate- improves metabolism (if prefer sweetened, best to sweeten with honey instead of sugar).  3) cruciferous vegetables- high in fiber and protein 4)  full fat yogurt: high in healthy fat, protein, calcium, and probiotics 5) apples- high in a variety of phytochemicals 6) nuts- high in fiber and protein that increase feelings of fullness 7) grapefruit: rich in nutrients, antioxidants, and fiber (not to be taken with anticoagulation) 8) beans- high in protein and fiber 9) salmon- has high quality protein and healthy fats 10) green tea- rich in polyphenols 11) eggs- rich in choline and vitamin D 12) tuna- high protein, boosts metabolism 13) avocado- decreases visceral abdominal fat 14) chicken (pasture raised): high in protein and iron 15) blueberries- reduce abdominal fat and cholesterol 16) whole grains- decreases calories retained during digestion, speeds metabolism 17) chia seeds- curb appetite 18) chilies- increases fat metabolism  -Discussed supplements that can be used:  1) Metatrim 400mg  BID 30 minutes before breakfast and dinner  2) Sphaeranthus indicus and Garcinia mangostana (combinations of these and #1 can be found in capsicum and  zychrome  3) green coffee bean extract 400mg  twice per day or Irvingia (african mango) 150 to 300mg  twice per day.  3) Headaches -continue Topamax, provided with refill  4) Diabetes: -avoid sugar, bread, pasta, rice -avoid snacking -try to incorporate into your diet some of the following foods which are good for diabetes: 1) cinnamon- imitates effects of insulin, increasing glucose transport into cells ( or cinnamon is best, least processed) 2) nuts- can slow down the blood sugar response of carbohydrate rich foods 3) oatmeal- contains and anti-inflammatory compound avenanthramide 4) whole-milk yogurt (best types are no sugar, [place] yogurt, or goat/sheep yogurt) 5) beans- high in protein, fiber, and vitamins, low glycemic index 6) broccoli- great source of vitamin A and C 7) quinoa- higher in protein and fiber than other grains 8) spinach- high in vitamin A, fiber, and  protein 9) olive oil- reduces glucose levels, LDL, and triglycerides 10) salmon- excellent amount of omega-3-fatty acids 11) walnuts- rich in antioxidants 12) apples- high in fiber and quercetin 13) carrots- highly nutritious with low impact on blood sugar 14) eggs- improve HDL (good cholesterol), high in protein, keep you satiated 15) turmeric: improves blood sugars, cardiovascular disease, and protects kidney health 16) garlic: improves blood sugar, blood pressure, pain 17) tomatoes: highly nutritious with low impact on blood sugar   40 minutes spent in review of her chart, providing stroke education, reviewing Brain MRI and discussing results with patient, providing with list of foods for weight loss, speech therapy, cognitive impairments,

## 2021-07-31 NOTE — Patient Instructions (Addendum)
N-acetyl-cysteine 600mg  BID  Cefarolin NAC  Podcasts: The The [employer] Podcast  Books: Eat Fat, Get Thin by [employer] but Iona Hansen

## 2021-08-05 ENCOUNTER — Ambulatory Visit
Payer: BC Managed Care – PPO | Attending: Physical Medicine and Rehabilitation | Admitting: Rehabilitative and Restorative Service Providers"

## 2021-08-05 ENCOUNTER — Other Ambulatory Visit: Payer: Self-pay

## 2021-08-05 ENCOUNTER — Encounter: Payer: Self-pay | Admitting: Rehabilitative and Restorative Service Providers"

## 2021-08-05 DIAGNOSIS — M6281 Muscle weakness (generalized): Secondary | ICD-10-CM | POA: Diagnosis present

## 2021-08-05 DIAGNOSIS — R2689 Other abnormalities of gait and mobility: Secondary | ICD-10-CM | POA: Insufficient documentation

## 2021-08-05 DIAGNOSIS — I69354 Hemiplegia and hemiparesis following cerebral infarction affecting left non-dominant side: Secondary | ICD-10-CM | POA: Insufficient documentation

## 2021-08-05 DIAGNOSIS — R2681 Unsteadiness on feet: Secondary | ICD-10-CM | POA: Diagnosis present

## 2021-08-05 DIAGNOSIS — I639 Cerebral infarction, unspecified: Secondary | ICD-10-CM | POA: Diagnosis present

## 2021-08-05 NOTE — Therapy (Signed)
[facility] [address] [place], [place], [address] Phone: [phone]   Fax:  [phone]  Physical Therapy Treatment  Patient Details  Name: Danielle Kelley MRN: [id number] Date of Birth: 04/10/1981 Referring Provider (PT): Carlis Abbott Drema Pry, MD   Encounter Date: 08/05/2021   PT End of Session - 08/05/21 1104     Visit Number 4    Date for PT Re-Evaluation 09/04/21    PT Start Time 1100    PT Stop Time 1140    PT Time Calculation (min) 40 min    Activity Tolerance Patient tolerated treatment well    Behavior During Therapy [facility] for tasks assessed/performed             Past Medical History:  Diagnosis Date   Obesity    Seasonal allergies     Past Surgical History:  Procedure Laterality Date   ABDOMINAL SURGERY  04/06/2017   Gastric sleeve   BUBBLE STUDY N/A 06/26/2021   Procedure: BUBBLE STUDY;  Surgeon: Jodelle Red, MD;  Location: [facility] ENDOSCOPY;  Service: Cardiovascular;  Laterality: N/A;   CESAREAN SECTION     2004, 2006, 2007.   TEE WITHOUT CARDIOVERSION N/A 06/26/2021   Procedure: TRANSESOPHAGEAL ECHOCARDIOGRAM (TEE);  Surgeon: Jodelle Red, MD;  Location: [facility] ENDOSCOPY;  Service: Cardiovascular;  Laterality: N/A;    There were no vitals filed for this visit.   Subjective Assessment - 08/05/21 1103     Subjective I got my return to work date, 08/26/21.  Pt reports that she is trying not to use her L knee brace as much, but she just takes it slower.    Patient Stated Goals better balance/feel more steady, feel stronger, not as tired.    Currently in Pain? No/denies                               [facility] Adult PT Treatment/Exercise - 08/05/21 0001       Transfers   Five time sit to stand comments  33.1 sec with rare use of UE on thighs, feet even weight bearing.      Ambulation/Gait   Stairs Yes    Stairs Assistance 6: Modified independent (Device/Increase time)     Stair Management Technique One rail Left;Step to pattern    Number of Stairs 14    Curb 6: Modified independent (Device/increase time)    Gait Comments Amb outside x400 ft over various surfaces around back parking lot area and up slight incline of parking lot.      High Level Balance   High Level Balance Comments Standing on AirEx, Standing on AirEx with eyes closed      Knee/Hip Exercises: Aerobic   Nustep L5x5 min      Knee/Hip Exercises: Standing   Lateral Step Up Both;1 set;10 reps;Hand Hold: 2;Step Height: 2"    Lateral Step Up Limitations side step up and over AirEx and back      Knee/Hip Exercises: Seated   Long Arc Quad Strengthening;Both;2 sets;10 reps    Long Arc Quad Weight 5 lbs.    Marching Strengthening;Both;2 sets;10 reps    Marching Weights 5 lbs.                       PT Short Term Goals - 07/22/21 1244       PT SHORT TERM GOAL #1   Title Independent with initial HEP  Status Achieved               PT Long Term Goals - 08/05/21 1148       PT LONG TERM GOAL #1   Title Independent with advanced HEP    Status On-going      PT LONG TERM GOAL #2   Title 5TSTS improved to </= 12 seconds with symmetrical alignment and no UE support to demonstrate improved functional strength, endurance.    Status On-going      PT LONG TERM GOAL #5   Title LUE and LLE strength to at least 4+/5    Status On-going                   Plan - 08/05/21 1145     Clinical Impression Statement Morenike continues to progress towards goal related activities.  She was able to proress to 5 pounds with seated ther ex, but did require increased time and had to take increased recovery period in sitting following. She did have difficulty with stair negotiation and has a slight sideways approach.  She requires cuing and encouragement to complete with unilateral rail instead of bilateral railing.  Pt has improved her time with 5 times sit to/from stand assessment  with decreased UE assist required.  Pt continues to have decreased balance and requires UE support with side stepping onto AirEx.    PT Treatment/Interventions ADLs/Self Care Home Management;Electrical Stimulation;Moist Heat;Gait training;Functional mobility training;Stair training;Therapeutic activities;Therapeutic exercise;Balance training;Neuromuscular re-education;Patient/family education;Manual techniques;Energy conservation;Taping;Vestibular    PT Next Visit Plan Reassess and progress HEP as appropriate. Work on initiating exercises for overall strength working on gradually increasing repetitions as fatigue improves. can monitor VS pre and post activity to assess tolerance. Balance and gait, LLE stability exercises. Can also further assess and work on control with stairs.    Consulted and Agree with Plan of Care Patient             Patient will benefit from skilled therapeutic intervention in order to improve the following deficits and impairments:  Abnormal gait, Difficulty walking, Decreased endurance, Decreased activity tolerance, Decreased balance, Decreased mobility, Decreased strength, Postural dysfunction  Visit Diagnosis: Muscle weakness (generalized)  Other abnormalities of gait and mobility  Hemiplegia and hemiparesis following cerebral infarction affecting left non-dominant side (HCC)  Unsteadiness on feet  Acute CVA (cerebrovascular accident) [facility])     Problem List Patient Active Problem List   Diagnosis Date Noted   Vitamin B 12 deficiency 07/03/2021   Morbid obesity (HCC) 07/03/2021   New onset of headaches 07/03/2021   Stroke (cerebrum) (HCC) 06/28/2021   Iron deficiency anemia    Seasonal allergies    Acute CVA (cerebrovascular accident) (HCC) 06/25/2021   Cerebrovascular accident (CVA) (HCC) 06/24/2021    Reather Laurence, PT, DPT 08/05/2021, 11:49 AM  [facility] [place] [facility] [address] [place], [place],  [address] Phone: [phone]   Fax:  [phone]  Name: Suleima Ohlendorf MRN: [id number] Date of Birth: 1981-05-30

## 2021-08-12 ENCOUNTER — Other Ambulatory Visit: Payer: Self-pay

## 2021-08-12 ENCOUNTER — Encounter: Payer: Self-pay | Admitting: Physical Therapy

## 2021-08-12 ENCOUNTER — Ambulatory Visit: Payer: BC Managed Care – PPO | Admitting: Physical Therapy

## 2021-08-12 DIAGNOSIS — I69354 Hemiplegia and hemiparesis following cerebral infarction affecting left non-dominant side: Secondary | ICD-10-CM

## 2021-08-12 DIAGNOSIS — R2689 Other abnormalities of gait and mobility: Secondary | ICD-10-CM

## 2021-08-12 DIAGNOSIS — I639 Cerebral infarction, unspecified: Secondary | ICD-10-CM

## 2021-08-12 DIAGNOSIS — M6281 Muscle weakness (generalized): Secondary | ICD-10-CM

## 2021-08-12 DIAGNOSIS — R2681 Unsteadiness on feet: Secondary | ICD-10-CM

## 2021-08-12 NOTE — Patient Instructions (Signed)
Access Code: [email or web address] URL: [email or web address] Date: 08/12/2021 Prepared by: Oley Balm  Exercises Lateral Step Up - 1 x daily - 7 x weekly - 1 sets - 10 reps Crossover Step Up - 1 x daily - 7 x weekly - 1 sets - 10 reps Step Up - 1 x daily - 7 x weekly - 1 sets - 10 reps Backward Step Up - 1 x daily - 7 x weekly - 1 sets - 10 reps Start with just forward and lateral steps ups.

## 2021-08-12 NOTE — Therapy (Signed)
[facility] [place] [facility] [address] [place], [place], [address] Phone: [phone]   Fax:  [phone]  Physical Therapy Treatment  Patient Details  Name: Danielle Kelley MRN: [id number] Date of Birth: 03-01-1981 Referring Provider (PT): Carlis Abbott Drema Pry, MD   Encounter Date: 08/12/2021   PT End of Session - 08/12/21 1146     Visit Number 5    Date for PT Re-Evaluation 09/04/21    PT Start Time 1100    PT Stop Time 1146    PT Time Calculation (min) 46 min    Equipment Utilized During Treatment Gait belt    Activity Tolerance Patient tolerated treatment well    Behavior During Therapy [facility] for tasks assessed/performed             Past Medical History:  Diagnosis Date   Obesity    Seasonal allergies     Past Surgical History:  Procedure Laterality Date   ABDOMINAL SURGERY  04/06/2017   Gastric sleeve   BUBBLE STUDY N/A 06/26/2021   Procedure: BUBBLE STUDY;  Surgeon: Jodelle Red, MD;  Location: [facility] ENDOSCOPY;  Service: Cardiovascular;  Laterality: N/A;   CESAREAN SECTION     2004, 2006, 2007.   TEE WITHOUT CARDIOVERSION N/A 06/26/2021   Procedure: TRANSESOPHAGEAL ECHOCARDIOGRAM (TEE);  Surgeon: Jodelle Red, MD;  Location: [facility] ENDOSCOPY;  Service: Cardiovascular;  Laterality: N/A;    There were no vitals filed for this visit.   Subjective Assessment - 08/12/21 1106     Subjective Reports a good weekend.    Patient Stated Goals better balance/feel more steady, feel stronger, not as tired.    Currently in Pain? No/denies                               [facility] Adult PT Treatment/Exercise - 08/12/21 0001       Knee/Hip Exercises: Aerobic   Nustep L5x6 min      Knee/Hip Exercises: Standing   Step Down Limitations Stand on Air Ex pad. Lift LLE and reach forward to tap heel on floor in front of pad, to the side, then back to center, repeate with RLE, 2 x 2 sets.      Knee/Hip  Exercises: Supine   Bridges Strengthening;Left;Both;1 set;10 reps    Other Supine Knee/Hip Exercises Bridge over ball with legs turned in to engage adducrtors. Rolled therapeutic ball knees to chest B, then U with L. Poor control with LLE.                       PT Short Term Goals - 08/12/21 1414       PT SHORT TERM GOAL #1   Title Independent with initial HEP    Baseline Program modified    Time 2    Period Weeks    Status Achieved      PT SHORT TERM GOAL #2   Title I with updated HEP    Baseline HEP updated, written descriptions provided.    Time 2    Period Weeks    Status New    Target Date 08/26/21               PT Long Term Goals - 08/05/21 1148       PT LONG TERM GOAL #1   Title Independent with advanced HEP    Status On-going      PT LONG TERM GOAL #2  Title 5TSTS improved to </= 12 seconds with symmetrical alignment and no UE support to demonstrate improved functional strength, endurance.    Status On-going      PT LONG TERM GOAL #5   Title LUE and LLE strength to at least 4+/5    Status On-going                   Plan - 08/12/21 1409     Clinical Impression Statement Danielle Kelley participated well. She is progressing toward LTG, continues to demonstrate decreased strength and coordination of LLE. She also shows poor cardiovascular tolerance, requires frequent rest breaks due to SOB. Demonstrates decreased stance time and decreased weight shift onto LLE during gait. She performed single limb stance activities as well as speed of movement to increased coordinated LLE control. HEP updated to increase challenge of activities by performing most in stand.    PT Treatment/Interventions ADLs/Self Care Home Management;Electrical Stimulation;Moist Heat;Gait training;Functional mobility training;Stair training;Therapeutic activities;Therapeutic exercise;Balance training;Neuromuscular re-education;Patient/family education;Manual techniques;Energy  conservation;Taping;Vestibular    PT Next Visit Plan Assess tolerance to HEP and update as appropriate. Continue to challenge strength and coordinated contorl in LEs and trunk, especially LLE. Challenge speed of movement.    PT Home Exercise Plan see pt instructions    Consulted and Agree with Plan of Care Patient             Patient will benefit from skilled therapeutic intervention in order to improve the following deficits and impairments:  Abnormal gait, Difficulty walking, Decreased endurance, Decreased activity tolerance, Decreased balance, Decreased mobility, Decreased strength, Postural dysfunction  Visit Diagnosis: Muscle weakness (generalized)  Other abnormalities of gait and mobility  Hemiplegia and hemiparesis following cerebral infarction affecting left non-dominant side (HCC)  Unsteadiness on feet  Acute CVA (cerebrovascular accident) [facility])     Problem List Patient Active Problem List   Diagnosis Date Noted   Vitamin B 12 deficiency 07/03/2021   Morbid obesity (HCC) 07/03/2021   New onset of headaches 07/03/2021   Stroke (cerebrum) (HCC) 06/28/2021   Iron deficiency anemia    Seasonal allergies    Acute CVA (cerebrovascular accident) (HCC) 06/25/2021   Cerebrovascular accident (CVA) (HCC) 06/24/2021    Iona Beard, DPT 08/12/2021, 2:17 PM  [facility] [place] [facility] [address] [place], [place], [address] Phone: [phone]   Fax:  [phone]  Name: Danielle Kelley MRN: [id number] Date of Birth: Aug 23, 1981

## 2021-08-19 ENCOUNTER — Ambulatory Visit: Payer: BC Managed Care – PPO | Admitting: Physical Therapy

## 2021-08-19 ENCOUNTER — Other Ambulatory Visit: Payer: Self-pay

## 2021-08-19 ENCOUNTER — Encounter: Payer: Self-pay | Admitting: Physical Therapy

## 2021-08-19 DIAGNOSIS — R2689 Other abnormalities of gait and mobility: Secondary | ICD-10-CM

## 2021-08-19 DIAGNOSIS — M6281 Muscle weakness (generalized): Secondary | ICD-10-CM

## 2021-08-19 DIAGNOSIS — I639 Cerebral infarction, unspecified: Secondary | ICD-10-CM

## 2021-08-19 DIAGNOSIS — I69354 Hemiplegia and hemiparesis following cerebral infarction affecting left non-dominant side: Secondary | ICD-10-CM

## 2021-08-19 DIAGNOSIS — R2681 Unsteadiness on feet: Secondary | ICD-10-CM

## 2021-08-19 NOTE — Therapy (Signed)
[place] [place], [place], [address] Phone: [phone]   Fax:  [phone]  Physical Therapy Treatment  Patient Details  Name: Danielle Kelley MRN: [id number] Date of Birth: 1981/08/07 Referring Provider (PT): Ranell Patrick Clide Deutscher, MD   Encounter Date: 08/19/2021   PT End of Session - 08/19/21 1643     Visit Number 6    Date for PT Re-Evaluation 09/04/21    PT Start Time 1500    PT Stop Time 3009    PT Time Calculation (min) 47 min    Activity Tolerance Patient tolerated treatment well    Behavior During Therapy [facility] for tasks assessed/performed             Past Medical History:  Diagnosis Date   Obesity    Seasonal allergies     Past Surgical History:  Procedure Laterality Date   ABDOMINAL SURGERY  04/06/2017   Gastric sleeve   BUBBLE STUDY N/A 06/26/2021   Procedure: BUBBLE STUDY;  Surgeon: Buford Dresser, MD;  Location: [facility] ENDOSCOPY;  Service: Cardiovascular;  Laterality: N/A;   CESAREAN SECTION     2004, 2006, 2007.   TEE WITHOUT CARDIOVERSION N/A 06/26/2021   Procedure: TRANSESOPHAGEAL ECHOCARDIOGRAM (TEE);  Surgeon: Buford Dresser, MD;  Location: [facility] ENDOSCOPY;  Service: Cardiovascular;  Laterality: N/A;    There were no vitals filed for this visit.   Subjective Assessment - 08/19/21 1504     Subjective Had a good weeken. She does notfeel like her HEP challenged her enough.    Patient Stated Goals better balance/feel more steady, feel stronger, not as tired.    Currently in Pain? No/denies                               [facility] Adult PT Treatment/Exercise - 08/19/21 0001       Knee/Hip Exercises: Aerobic   Tread Mill 1.2 x 5 minutes    Nustep L4-3 min      Knee/Hip Exercises: Standing   Hip Abduction Stengthening;Both;1 set;10 reps    Abduction Limitations standing, red T band resistance, increased difficulty standing on LLE.      Knee/Hip  Exercises: Supine   Other Supine Knee/Hip Exercises B bridge x 10 reps      Knee/Hip Exercises: Prone   Other Prone Exercises Quadruped- single arm reach, single leg reach, 5 reps each   VC/TC to maintain strong core stability.                    PT Education - 08/19/21 1643     Education Details Updated HEP, POC    Person(s) Educated Patient    Methods Explanation;Demonstration;Handout    Comprehension Verbalized understanding;Returned demonstration              PT Short Term Goals - 08/19/21 1645       PT SHORT TERM GOAL #2   Title I with updated HEP    Baseline HEP updated, written descriptions provided.    Time 1    Period Weeks    Status Partially Met    Target Date 08/26/21               PT Long Term Goals - 08/19/21 1647       PT LONG TERM GOAL #1   Title Independent with advanced HEP    Time 4    Period Weeks  Status On-going    Target Date 09/04/21      PT LONG TERM GOAL #5   Title LUE and LLE strength to at least 4+/5    Baseline LLE grossly 4-/5    Time 4    Period Weeks    Status On-going    Target Date 09/04/21                   Plan - 08/19/21 1644     Clinical Impression Statement Kamaiyah reported that HEP was not particilarly challenging. Treatment emphasized trunk stability, RLE strength, control, balance. Identified appropriate upgrades for HEP and provided patient with written description.    Stability/Clinical Decision Making Evolving/Moderate complexity    Clinical Decision Making Moderate    Rehab Potential Good    PT Frequency 1x / week    PT Duration 4 weeks    PT Treatment/Interventions ADLs/Self Care Home Management;Electrical Stimulation;Moist Heat;Gait training;Functional mobility training;Stair training;Therapeutic activities;Therapeutic exercise;Balance training;Neuromuscular re-education;Patient/family education;Manual techniques;Energy conservation;Taping;Vestibular    PT Next Visit Plan Assess  tolerance to HEP and update as appropriate. Continue to challenge strength and coordinated contorl in LEs and trunk, especially LLE. Challenge speed of movement.             Patient will benefit from skilled therapeutic intervention in order to improve the following deficits and impairments:  Abnormal gait, Difficulty walking, Decreased endurance, Decreased activity tolerance, Decreased balance, Decreased mobility, Decreased strength, Postural dysfunction  Visit Diagnosis: Muscle weakness (generalized)  Hemiplegia and hemiparesis following cerebral infarction affecting left non-dominant side (HCC)  Unsteadiness on feet  Acute CVA (cerebrovascular accident) ([place])  Other abnormalities of gait and mobility     Problem List Patient Active Problem List   Diagnosis Date Noted   Vitamin B 12 deficiency 07/03/2021   Morbid obesity ([place]) 07/03/2021   New onset of headaches 07/03/2021   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 [place], [place], [address] Phone: [phone]   Fax:  [phone]  Name: Danielle Kelley MRN: [id number] Date of Birth: 23-Nov-1980

## 2021-08-19 NOTE — Patient Instructions (Signed)
Access Code: [email or web address] URL: [email or web address] Date: 08/19/2021 Prepared by: Oley Balm  Exercises Quadruped Alternating Arm Lift - 1 x daily - 7 x weekly - 1 sets - 5 reps Quadruped Alternating Leg Extensions - 1 x daily - 7 x weekly - 1 sets - 5 reps Supine Bridge - 1 x daily - 7 x weekly - 1 sets - 10 reps Single Leg Bridge - 1 x daily - 7 x weekly - 1 sets - 10 reps Figure 4 Bridge - 1 x daily - 7 x weekly - 1 sets - 10 reps

## 2021-08-26 ENCOUNTER — Other Ambulatory Visit: Payer: Self-pay

## 2021-08-26 ENCOUNTER — Encounter: Payer: Self-pay | Admitting: Physical Therapy

## 2021-08-26 ENCOUNTER — Ambulatory Visit: Payer: BC Managed Care – PPO | Admitting: Physical Therapy

## 2021-08-26 DIAGNOSIS — M6281 Muscle weakness (generalized): Secondary | ICD-10-CM

## 2021-08-26 DIAGNOSIS — I69354 Hemiplegia and hemiparesis following cerebral infarction affecting left non-dominant side: Secondary | ICD-10-CM

## 2021-08-26 DIAGNOSIS — I639 Cerebral infarction, unspecified: Secondary | ICD-10-CM

## 2021-08-26 DIAGNOSIS — R2689 Other abnormalities of gait and mobility: Secondary | ICD-10-CM

## 2021-08-26 DIAGNOSIS — R2681 Unsteadiness on feet: Secondary | ICD-10-CM

## 2021-08-26 NOTE — Therapy (Signed)
[facility] [address] [place], [place], [address] Phone: [phone]   Fax:  [phone]  Physical Therapy Treatment  Patient Details  Name: Danielle Kelley MRN: [id number] Date of Birth: 06/24/1981 Referring Provider (PT): Carlis Abbott Drema Pry, MD   Encounter Date: 08/26/2021   PT End of Session - 08/26/21 1539     Visit Number 7    Date for PT Re-Evaluation 09/04/21    PT Start Time 1515    PT Stop Time 1544    PT Time Calculation (min) 29 min    Activity Tolerance Patient tolerated treatment well    Behavior During Therapy [facility] for tasks assessed/performed             Past Medical History:  Diagnosis Date   Obesity    Seasonal allergies     Past Surgical History:  Procedure Laterality Date   ABDOMINAL SURGERY  04/06/2017   Gastric sleeve   BUBBLE STUDY N/A 06/26/2021   Procedure: BUBBLE STUDY;  Surgeon: Jodelle Red, MD;  Location: [facility] ENDOSCOPY;  Service: Cardiovascular;  Laterality: N/A;   CESAREAN SECTION     2004, 2006, 2007.   TEE WITHOUT CARDIOVERSION N/A 06/26/2021   Procedure: TRANSESOPHAGEAL ECHOCARDIOGRAM (TEE);  Surgeon: Jodelle Red, MD;  Location: [facility] ENDOSCOPY;  Service: Cardiovascular;  Laterality: N/A;    There were no vitals filed for this visit.   Subjective Assessment - 08/26/21 1519     Subjective Good weekend. Today was her first day back at work. She was supposed to work 1/2 day, but ended up working later and arrives fatigued.    Pertinent History CVA    Limitations Standing;Walking    Patient Stated Goals better balance/feel more steady, feel stronger, not as tired.                               [facility] Adult PT Treatment/Exercise - 08/26/21 0001       Knee/Hip Exercises: Machines for Strengthening   Cybex Knee Extension LLE 2 x 10 reps    Cybex Knee Flexion 25#, LLE 2 x 10 reps      Knee/Hip Exercises: Supine   Bridges  Strengthening;Both;1 set;10 reps    Bridges Limitations Bridging over ball with B LE lowered on the sides of ball and IR to engage add. 1 x 10 reps    Other Supine Knee/Hip Exercises rolling knees to chest on therapeutic ball 1 x 10 reps                     PT Education - 08/26/21 1538     Education Details Treatment session modified due to patient fatigue after first day back at work. Cont HEP, Rest when fatigued.    Person(s) Educated Patient    Methods Explanation    Comprehension Verbalized understanding              PT Short Term Goals - 08/26/21 1523       PT SHORT TERM GOAL #2   Title I with updated HEP    Baseline Patient reports she is performing program, challenging.    Time 1    Period Weeks    Status On-going    Target Date 09/02/21               PT Long Term Goals - 08/26/21 1529       PT LONG TERM GOAL #1  Title Independent with advanced HEP    Time 3    Period Weeks    Status On-going    Target Date 09/04/21      PT LONG TERM GOAL #2   Title 5TSTS improved to </= 12 seconds with symmetrical alignment and no UE support to demonstrate improved functional strength, endurance.    Baseline Re-assessment deferred today. Patient returned to work and is very fatigue.    Time 7    Period Weeks    Status On-going                   Plan - 08/26/21 1540     Clinical Impression Statement Patient says her updated HEP is much more challenging. Returned to work today and ended up working much longer than anticipated. Fatigued upon arrival, but wanted to work. Treatment activities modified to accomodate her fatigue    Stability/Clinical Decision Making Evolving/Moderate complexity    Clinical Decision Making Moderate    Rehab Potential Good    PT Frequency 1x / week    PT Duration 3 weeks    PT Treatment/Interventions ADLs/Self Care Home Management;Electrical Stimulation;Moist Heat;Gait training;Functional mobility training;Stair  training;Therapeutic activities;Therapeutic exercise;Balance training;Neuromuscular re-education;Patient/family education;Manual techniques;Energy conservation;Taping;Vestibular    PT Next Visit Plan Modify HEP as appropriate. Maintained today.    Consulted and Agree with Plan of Care Patient             Patient will benefit from skilled therapeutic intervention in order to improve the following deficits and impairments:  Abnormal gait, Difficulty walking, Decreased endurance, Decreased activity tolerance, Decreased balance, Decreased mobility, Decreased strength, Postural dysfunction  Visit Diagnosis: Muscle weakness (generalized)  Hemiplegia and hemiparesis following cerebral infarction affecting left non-dominant side (HCC)  Unsteadiness on feet  Acute CVA (cerebrovascular accident) (HCC)  Other abnormalities of gait and mobility     Problem List Patient Active Problem List   Diagnosis Date Noted   Vitamin B 12 deficiency 07/03/2021   Morbid obesity (HCC) 07/03/2021   New onset of headaches 07/03/2021   Stroke (cerebrum) (HCC) 06/28/2021   Iron deficiency anemia    Seasonal allergies    Acute CVA (cerebrovascular accident) (HCC) 06/25/2021   Cerebrovascular accident (CVA) (HCC) 06/24/2021    Iona Beard, DPT 08/26/2021, 3:52 PM  [facility] [address] [place], [place], [address] Phone: [phone]   Fax:  [phone]  Name: Danielle Kelley MRN: [id number] Date of Birth: 08/31/1981

## 2021-09-02 ENCOUNTER — Encounter: Payer: Self-pay | Admitting: Physical Therapy

## 2021-09-02 ENCOUNTER — Other Ambulatory Visit: Payer: Self-pay

## 2021-09-02 ENCOUNTER — Telehealth: Payer: Self-pay

## 2021-09-02 ENCOUNTER — Ambulatory Visit: Payer: BC Managed Care – PPO | Admitting: Physical Therapy

## 2021-09-02 DIAGNOSIS — M6281 Muscle weakness (generalized): Secondary | ICD-10-CM | POA: Diagnosis not present

## 2021-09-02 DIAGNOSIS — I639 Cerebral infarction, unspecified: Secondary | ICD-10-CM

## 2021-09-02 DIAGNOSIS — R2689 Other abnormalities of gait and mobility: Secondary | ICD-10-CM

## 2021-09-02 DIAGNOSIS — R2681 Unsteadiness on feet: Secondary | ICD-10-CM

## 2021-09-02 DIAGNOSIS — I69354 Hemiplegia and hemiparesis following cerebral infarction affecting left non-dominant side: Secondary | ICD-10-CM

## 2021-09-02 MED ORDER — TOPIRAMATE 50 MG PO TABS: 50.0000 mg | ORAL_TABLET | Freq: Every day | ORAL | 0 refills | Status: DC

## 2021-09-02 NOTE — Telephone Encounter (Signed)
[facility] sent a fax requesting Atorvastatin for 90 day supply. Please advise

## 2021-09-02 NOTE — Therapy (Signed)
[facility] [address] [place], [place], [address] Phone: [phone]   Fax:  [phone]  Physical Therapy Treatment  Patient Details  Name: Danielle Kelley MRN: [id number] Date of Birth: Dec 19, 1980 Referring Provider (PT): Carlis Abbott Drema Pry, MD   Encounter Date: 09/02/2021   PT End of Session - 09/02/21 1632     Visit Number 8    Date for PT Re-Evaluation 09/04/21    PT Start Time 1502    PT Stop Time 1544    PT Time Calculation (min) 42 min    Activity Tolerance Patient tolerated treatment well    Behavior During Therapy [facility] for tasks assessed/performed             Past Medical History:  Diagnosis Date   Obesity    Seasonal allergies     Past Surgical History:  Procedure Laterality Date   ABDOMINAL SURGERY  04/06/2017   Gastric sleeve   BUBBLE STUDY N/A 06/26/2021   Procedure: BUBBLE STUDY;  Surgeon: Jodelle Red, MD;  Location: [facility] ENDOSCOPY;  Service: Cardiovascular;  Laterality: N/A;   CESAREAN SECTION     2004, 2006, 2007.   TEE WITHOUT CARDIOVERSION N/A 06/26/2021   Procedure: TRANSESOPHAGEAL ECHOCARDIOGRAM (TEE);  Surgeon: Jodelle Red, MD;  Location: [facility] ENDOSCOPY;  Service: Cardiovascular;  Laterality: N/A;    There were no vitals filed for this visit.   Subjective Assessment - 09/02/21 1510     Subjective Patient reports fatigue. She has done a lot of walking today. Noted to be limping more, wearing her knee brace.    Pertinent History CVA    Limitations Standing;Walking    How long can you walk comfortably? 35 minutes.    Patient Stated Goals better balance/feel more steady, feel stronger, not as tired.    Currently in Pain? No/denies                               [facility] Adult PT Treatment/Exercise - 09/02/21 0001       Knee/Hip Exercises: Aerobic   Tread Mill 1.2 x 5 minutes    Nustep L5-6 minutees.      Knee/Hip Exercises: Machines for  Strengthening   Cybex Leg Press 30# LLE only, slow eccentrics, x 10      Knee/Hip Exercises: Supine   Other Supine Knee/Hip Exercises Supine with L LE pushing into red therapy ball into near full extension. Maintained pressure while lifting RLE off mat 2 sets of 3. Required VC for breath control.                     PT Education - 09/02/21 1535     Education Details Patient has increased level of activity dramatically. Educated to pay close attention to her fatigue and rest when needed.    Person(s) Educated Patient    Methods Explanation    Comprehension Verbalized understanding              PT Short Term Goals - 09/02/21 1519       PT SHORT TERM GOAL #2   Title I with updated HEP    Baseline Patient reports she is performing program, continues to be challenging.    Time 1    Period Weeks    Status On-going    Target Date 09/09/21               PT Long Term Goals -  09/02/21 1521       PT LONG TERM GOAL #1   Title Independent with advanced HEP    Time 3    Period Weeks    Status On-going      PT LONG TERM GOAL #2   Title 5TSTS improved to </= 12 seconds with symmetrical alignment and no UE support to demonstrate improved functional strength, endurance.    Baseline Re-assessment deferred today. Patient returned to work and is very fatigue.    Time 7    Period Weeks    Status On-going      PT LONG TERM GOAL #3   Baseline 21    Time 2    Period Weeks    Status On-going    Target Date 09/04/21                   Plan - 09/02/21 1531     Clinical Impression Statement Patient reports HEP continues to approrpiately challenge her. 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 She is very tired.    Stability/Clinical Decision Making Evolving/Moderate complexity    Rehab Potential Good    PT Frequency 1x / week    PT Duration 2 weeks    PT  Treatment/Interventions ADLs/Self Care Home Management;Electrical Stimulation;Moist Heat;Gait training;Functional mobility training;Stair training;Therapeutic activities;Therapeutic exercise;Balance training;Neuromuscular re-education;Patient/family education;Manual techniques;Energy conservation;Taping;Vestibular    PT Next Visit Plan Modify HEP as appropriate. Maintained today.    Consulted and Agree with Plan of Care Patient             Patient will benefit from skilled therapeutic intervention in order to improve the following deficits and impairments:  Abnormal gait, Difficulty walking, Decreased endurance, Decreased activity tolerance, Decreased balance, Decreased mobility, Decreased strength, Postural dysfunction  Visit Diagnosis: Muscle weakness (generalized)  Hemiplegia and hemiparesis following cerebral infarction affecting left non-dominant side (HCC)  Unsteadiness on feet  Acute CVA (cerebrovascular accident) (HCC)  Other abnormalities of gait and mobility     Problem List Patient Active Problem List   Diagnosis Date Noted   Vitamin B 12 deficiency 07/03/2021   Morbid obesity (HCC) 07/03/2021   New onset of headaches 07/03/2021   Stroke (cerebrum) (HCC) 06/28/2021   Iron deficiency anemia    Seasonal allergies    Acute CVA (cerebrovascular accident) (HCC) 06/25/2021   Cerebrovascular accident (CVA) (HCC) 06/24/2021    Iona Beard, DPT 09/02/2021, 6:03 PM  [facility] [address] [place], [place], [address] Phone: [phone]   Fax:  [phone]  Name: Danielle Kelley MRN: [id number] Date of Birth: [birth year]

## 2021-09-04 ENCOUNTER — Other Ambulatory Visit: Payer: Self-pay

## 2021-09-04 MED ORDER — ATORVASTATIN CALCIUM 40 MG PO TABS: 40.0000 mg | ORAL_TABLET | Freq: Every day | ORAL | 0 refills | Status: DC

## 2021-09-04 NOTE — Telephone Encounter (Signed)
Atorvastatin sent to [facility]

## 2021-09-09 ENCOUNTER — Encounter: Payer: Self-pay | Admitting: Physical Therapy

## 2021-09-09 ENCOUNTER — Ambulatory Visit: Payer: BC Managed Care – PPO | Attending: Physical Medicine and Rehabilitation | Admitting: Physical Therapy

## 2021-09-09 ENCOUNTER — Other Ambulatory Visit: Payer: Self-pay

## 2021-09-09 DIAGNOSIS — I69354 Hemiplegia and hemiparesis following cerebral infarction affecting left non-dominant side: Secondary | ICD-10-CM | POA: Diagnosis present

## 2021-09-09 DIAGNOSIS — R2689 Other abnormalities of gait and mobility: Secondary | ICD-10-CM | POA: Insufficient documentation

## 2021-09-09 DIAGNOSIS — M6281 Muscle weakness (generalized): Secondary | ICD-10-CM | POA: Insufficient documentation

## 2021-09-09 DIAGNOSIS — I639 Cerebral infarction, unspecified: Secondary | ICD-10-CM | POA: Diagnosis present

## 2021-09-09 DIAGNOSIS — R2681 Unsteadiness on feet: Secondary | ICD-10-CM | POA: Insufficient documentation

## 2021-09-09 NOTE — Patient Instructions (Signed)
Access Code: [email or web address] URL: [email or web address] Date: 09/09/2021 Prepared by: Oley Balm  Exercises Lateral Step Down - 1 x daily - 7 x weekly - 2 sets - 10 reps Quadruped Hip Abduction and External Rotation - 1 x daily - 7 x weekly - 1 sets - 5-10 reps Quadruped Hip Extension Kicks - 1 x daily - 7 x weekly - 1 sets - 5-10 reps

## 2021-09-09 NOTE — Therapy (Signed)
[facility] [address] [place], [place], [address] Phone: [phone]   Fax:  [phone]  Physical Therapy Treatment  Patient Details  Name: Danielle Kelley MRN: [id number] Date of Birth: 08-17-1981 Referring Provider (PT): Carlis Abbott Drema Pry, MD   Encounter Date: 09/09/2021   PT End of Session - 09/09/21 1607     Visit Number 9    Date for PT Re-Evaluation 09/04/21    PT Start Time 1547    PT Stop Time 1636    PT Time Calculation (min) 49 min    Activity Tolerance Patient tolerated treatment well    Behavior During Therapy [facility] for tasks assessed/performed             Past Medical History:  Diagnosis Date   Obesity    Seasonal allergies     Past Surgical History:  Procedure Laterality Date   ABDOMINAL SURGERY  04/06/2017   Gastric sleeve   BUBBLE STUDY N/A 06/26/2021   Procedure: BUBBLE STUDY;  Surgeon: Jodelle Red, MD;  Location: [facility] ENDOSCOPY;  Service: Cardiovascular;  Laterality: N/A;   CESAREAN SECTION     2004, 2006, 2007.   TEE WITHOUT CARDIOVERSION N/A 06/26/2021   Procedure: TRANSESOPHAGEAL ECHOCARDIOGRAM (TEE);  Surgeon: Jodelle Red, MD;  Location: [facility] ENDOSCOPY;  Service: Cardiovascular;  Laterality: N/A;    There were no vitals filed for this visit.   Subjective Assessment - 09/09/21 1553     Subjective Patient is again fatigued after a long day of work. She reports that she still does not trust her leg and is afraid she will hurt herself. She feels like it may give on her.    Pertinent History CVA    Limitations Standing;Walking    How long can you walk comfortably? 35 minutes.    Patient Stated Goals better balance/feel more steady, feel stronger, not as tired.    Currently in Pain? No/denies                               [facility] Adult PT Treatment/Exercise - 09/09/21 0001       Knee/Hip Exercises: Aerobic   Tread Mill 1.5 x 5 minutes, 2.0 x 5  minutes.    Other Aerobic Patient noted to have more normalized gait at the higher speed, equal step length, weight shift.      Knee/Hip Exercises: Machines for Strengthening   Cybex Knee Extension LLE 2 x 10 reps    Cybex Knee Flexion 25#, LLE 2 x 10 reps      Knee/Hip Exercises: Standing   Step Down Left;1 set;5 reps    Step Down Limitations facing sideways on step, BUE support on rail      Knee/Hip Exercises: Prone   Other Prone Exercises Quadruped- donkey kick and firehydrants 5 each iwth BLE to acivate glutes for birddoga. VC and TC to maintain strong, stable core.                     PT Education - 09/09/21 1638     Education Details Patient reposts some discouragement with her level of fatigue. Educated her ot mental strain of working all day, noted normalized gait pattenr when walking at a normal speed.    Person(s) Educated Patient    Methods Explanation;Demonstration    Comprehension Verbalized understanding              PT Short Term Goals -  09/09/21 1559       PT SHORT TERM GOAL #1   Title Independent with initial HEP    Time 1    Period Weeks    Status Achieved      PT SHORT TERM GOAL #2   Title I with updated HEP    Baseline Program updated    Time 1    Period Weeks    Status On-going    Target Date 09/16/21               PT Long Term Goals - 09/09/21 1603       PT LONG TERM GOAL #1   Title Independent with advanced HEP    Time 6    Period Weeks    Status On-going    Target Date 11/04/21      PT LONG TERM GOAL #2   Title 5TSTS improved to </= 12 seconds with symmetrical alignment and no UE support to demonstrate improved functional strength, endurance.    Baseline Re-assessment deferred today. Patient returned to work and is very fatigue.    Time 6    Period Weeks    Status On-going    Target Date 11/04/21      PT LONG TERM GOAL #3   Baseline Re-assessment deferred today. Patient returned to work and is very fatigue.     Time 6    Period Weeks    Status On-going    Target Date 10/21/21      PT LONG TERM GOAL #4   Title BERG improved to at least 50/56 to demonstrate improved balance, decreased falls risk    Baseline Re-assessment deferred today. Patient returned to work and is very fatigue.    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)  Unsteadiness on feet  Acute CVA (cerebrovascular accident) (HCC)  Other abnormalities of gait and mobility     Problem List Patient Active Problem List   Diagnosis Date Noted   Vitamin B 12 deficiency 07/03/2021   Morbid obesity (HCC) 07/03/2021   New onset of headaches 07/03/2021   Stroke (cerebrum) (HCC) 06/28/2021   Iron deficiency anemia    Seasonal allergies    Acute CVA (cerebrovascular accident) (HCC) 06/25/2021   Cerebrovascular accident (CVA) (HCC) 06/24/2021    Iona Beard, DPT 09/09/2021, 4:45 PM  [facility] [address] [place], [place], [address] Phone: [phone]   Fax:  [phone]  Name: Danielle Kelley MRN: [id number] Date of Birth: 10-Dec-1980

## 2021-09-16 ENCOUNTER — Ambulatory Visit: Payer: BC Managed Care – PPO | Admitting: Physical Therapy

## 2021-09-17 ENCOUNTER — Other Ambulatory Visit: Payer: Self-pay

## 2021-09-17 ENCOUNTER — Encounter: Payer: Self-pay | Admitting: Neurology

## 2021-09-17 ENCOUNTER — Ambulatory Visit (INDEPENDENT_AMBULATORY_CARE_PROVIDER_SITE_OTHER): Payer: BC Managed Care – PPO | Admitting: Neurology

## 2021-09-17 VITALS — BP 124/84 | HR 63 | Ht 70.0 in | Wt 298.0 lb

## 2021-09-17 DIAGNOSIS — G8194 Hemiplegia, unspecified affecting left nondominant side: Secondary | ICD-10-CM

## 2021-09-17 DIAGNOSIS — I6381 Other cerebral infarction due to occlusion or stenosis of small artery: Secondary | ICD-10-CM | POA: Diagnosis not present

## 2021-09-17 DIAGNOSIS — E669 Obesity, unspecified: Secondary | ICD-10-CM | POA: Diagnosis not present

## 2021-09-17 DIAGNOSIS — G43009 Migraine without aura, not intractable, without status migrainosus: Secondary | ICD-10-CM

## 2021-09-17 MED ORDER — TOPIRAMATE 50 MG PO TABS: 50.0000 mg | ORAL_TABLET | Freq: Two times a day (BID) | ORAL | 3 refills | Status: DC

## 2021-09-17 NOTE — Patient Instructions (Signed)
I had a long d/w patient about her recent  lacunar stroke, risk for recurrent stroke/TIAs, personally independently reviewed imaging studies and stroke evaluation results and answered questions.Continue aspirin 81 mg daily  for secondary stroke prevention and maintain strict control of hypertension with blood pressure goal below 130/90, diabetes with hemoglobin A1c goal below 6.5% and lipids with LDL cholesterol goal below 70 mg/dL. I also advised the patient to eat a healthy diet with plenty of whole grains, cereals, fruits and vegetables, exercise regularly and maintain ideal body weight .check repeat lipid profile and vitamin B12 levels.  Refer for polysomnogram for sleep apnea.  Continue ongoing outpatient physical therapy.  Followup in the future with my nurse practitioner Shanda Bumps in 3 months or call earlier if necessary. Stroke Prevention Some medical conditions and behaviors can lead to a higher chance of having a stroke. You can help prevent a stroke by eating healthy, exercising, not smoking, and managing any medical conditions you have. Stroke is a leading cause of functional impairment. Primary prevention is particularly important because a majority of strokes are first-time events. Stroke changes the lives of not only those who experience a stroke but also their family and other caregivers. How can this condition affect me? A stroke is a medical emergency and should be treated right away. A stroke can lead to brain damage and can sometimes be life-threatening. If a person gets medical treatment right away, there is a better chance of surviving and recovering from a stroke. What can increase my risk? The following medical conditions may increase your risk of a stroke: Cardiovascular disease. High blood pressure (hypertension). Diabetes. High cholesterol. Sickle cell disease. Blood clotting disorders (hypercoagulable state). Obesity. Sleep disorders (obstructive sleep apnea). Other risk  factors include: Being older than age 73. Having a history of blood clots, stroke, or mini-stroke (transient ischemic attack, TIA). Genetic factors, such as race, ethnicity, or a family history of stroke. Smoking cigarettes or using other tobacco products. Taking birth control pills, especially if you also use tobacco. Heavy use of alcohol or drugs, especially cocaine and methamphetamine. Physical inactivity. What actions can I take to prevent this? Manage your health conditions High cholesterol levels. Eating a healthy diet is important for preventing high cholesterol. If cholesterol cannot be managed through diet alone, you may need to take medicines. Take any prescribed medicines to control your cholesterol as told by your health care provider. Hypertension. To reduce your risk of stroke, try to keep your blood pressure below 130/80. Eating a healthy diet and exercising regularly are important for controlling blood pressure. If these steps are not enough to manage your blood pressure, you may need to take medicines. Take any prescribed medicines to control hypertension as told by your health care provider. Ask your health care provider if you should monitor your blood pressure at home. Have your blood pressure checked every year, even if your blood pressure is normal. Blood pressure increases with age and some medical conditions. Diabetes. Eating a healthy diet and exercising regularly are important parts of managing your blood sugar (glucose). If your blood sugar cannot be managed through diet and exercise, you may need to take medicines. Take any prescribed medicines to control your diabetes as told by your health care provider. Get evaluated for obstructive sleep apnea. Talk to your health care provider about getting a sleep evaluation if you snore a lot or have excessive sleepiness. Make sure that any other medical conditions you have, such as atrial fibrillation or atherosclerosis, are  managed. Nutrition Follow instructions from your health care provider about what to eat or drink to help manage your health condition. These instructions may include: Reducing your daily calorie intake. Limiting how much salt (sodium) you use to 1,500 milligrams (mg) each day. Using only healthy fats for cooking, such as olive oil, canola oil, or sunflower oil. Eating healthy foods. You can do this by: Choosing foods that are high in fiber, such as whole grains, and fresh fruits and vegetables. Eating at least 5 servings of fruits and vegetables a day. Try to fill one-half of your plate with fruits and vegetables at each meal. Choosing lean protein foods, such as lean cuts of meat, poultry without skin, fish, tofu, beans, and nuts. Eating low-fat dairy products. Avoiding foods that are high in sodium. This can help lower blood pressure. Avoiding foods that have saturated fat, trans fat, and cholesterol. This can help prevent high cholesterol. Avoiding processed and prepared foods. Counting your daily carbohydrate intake.  Lifestyle If you drink alcohol: Limit how much you have to: 0-1 drink a day for women who are not pregnant. 0-2 drinks a day for men. Know how much alcohol is in your drink. In the [place] one drink equals one 12 oz bottle of beer (3108mL), one 5 oz glass of wine (14mL), or one 1 oz glass of hard liquor (22mL). Do not use any products that contain nicotine or tobacco. These products include cigarettes, chewing tobacco, and vaping devices, such as e-cigarettes. If you need help quitting, ask your health care provider. Avoid secondhand smoke. Do not use drugs. Activity  Try to stay at a healthy weight. Get at least 30 minutes of exercise on most days, such as: Fast walking. Biking. Swimming. Medicines Take over-the-counter and prescription medicines only as told by your health care provider. Aspirin or blood thinners (antiplatelets or anticoagulants) may be recommended  to reduce your risk of forming blood clots that can lead to stroke. Avoid taking birth control pills. Talk to your health care provider about the risks of taking birth control pills if: You are over 7 years old. You smoke. You get very bad headaches. You have had a blood clot. Where to find more information American Stroke Association: www.strokeassociation.org Get help right away if: You or a loved one has any symptoms of a stroke. "BE FAST" is an easy way to remember the main warning signs of a stroke: B - Balance. Signs are dizziness, sudden trouble walking, or loss of balance. E - Eyes. Signs are trouble seeing or a sudden change in vision. F - Face. Signs are sudden weakness or numbness of the face, or the face or eyelid drooping on one side. A - Arms. Signs are weakness or numbness in an arm. This happens suddenly and usually on one side of the body. S - Speech. Signs are sudden trouble speaking, slurred speech, or trouble understanding what people say. T - Time. Time to call emergency services. Write down what time symptoms started. You or a loved one has other signs of a stroke, such as: A sudden, severe headache with no known cause. Nausea or vomiting. Seizure. These symptoms may represent a serious problem that is an emergency. Do not wait to see if the symptoms will go away. Get medical help right away. Call your local emergency services (911 in the [place] Do not drive yourself to the hospital. Summary You can help to prevent a stroke by eating healthy, exercising, not smoking, limiting alcohol intake, and managing  any medical conditions you may have. Do not use any products that contain nicotine or tobacco. These include cigarettes, chewing tobacco, and vaping devices, such as e-cigarettes. If you need help quitting, ask your health care provider. Remember "BE FAST" for warning signs of a stroke. Get help right away if you or a loved one has any of these signs. This information  is not intended to replace advice given to you by your health care provider. Make sure you discuss any questions you have with your health care provider. Document Revised: 05/21/2020 Document Reviewed: 05/21/2020 Elsevier Patient Education  [place].

## 2021-09-17 NOTE — Progress Notes (Signed)
[facility] [address] [place]. [place] [address] [phone]       OFFICE CONSULT NOTE  [name] Date of Birth:  May 06, 1981 Medical Record Number:  [id number]   Referring MD:  Dr Erlinda Hong  Reason for Referral: Stroke  HPI: Danielle Kelley is a 40 year old pleasant African-American lady seen today for initial office consultation visit for stroke.  History is obtained from the patient and review of electronic medical records and opossum reviewed pertinent available imaging films in PACS.  Patient has no significant past medical history except obesity and seasonal allergies.  She presented on 06/24/2021 with sudden onset of left upper and lower extremity weakness and paresthesias.  She was cleaning her car but the day before when she noticed suddenly left arm and leg felt heavy.  She thought she had put too much pressure on the left side while cleaning and throughout the rest of the day she continues to notice this heaviness and weakness.  She woke up the next day and noticed that she stumbled and had trouble climbing 6 steps with her leg was weak and heavy.  She denied any accompanying headache, slurred speech, facial weakness.  On arrival she was outside the time window for tPA but NIH stroke scale was 5.  CT head was unremarkable and MRI showed a small acute infarct in the right periventricular posterior frontal white matter.  MR angiogram of the head showed no significant large vessel intracranial stenosis CT angiogram of the neck showed no significant extracranial stenosis.  2D echo showed normal ejection fraction of 60 to 65% without cardiac source of embolism.  Lower extremity venous Dopplers were negative.  Transesophageal echocardiogram was normal without evidence of a clot or PFO.  LDL cholesterol was elevated at 120 mg percent.  Hemoglobin A1c was 5.9.  Vitamin B12 level was low at 141 and she has since been started on B12 replacement.  Urine drug screen was negative.   Hypercoagulable panel labs were all negative.  ANA, ESR were normal.  She was started on aspirin and Plavix for 3 weeks followed by aspirin alone.  She is discharged and is now finishing outpatient physical therapy.  She states she is started eating healthy and has lost around 20 pounds and plans to lose more.  She also has history of migraines and they are quite severe in the hospital but she was started on Topamax 50 mg at night which seems to be helping and she still gets about 2 headache days a week and takes Excedrin which seems to help.  She is tolerating aspirin without bruising or bleeding.  She is tolerating Lipitor without significant muscle aches and pains.  She has not had any follow-up lipid profile or B12 levels checked.  Patient does admit to snoring but she has never been evaluated for sleep apnea.  There is no history of deep vein thrombosis, pulmonary embolism, recurrent miscarriages or strokes or heart attacks at a young age in her family. ROS:   14 system review of systems is positive for weakness, lightheadedness, migraines, light sensitivity, weight loss all other systems negative  PMH:  Past Medical History:  Diagnosis Date   Obesity    Seasonal allergies     Social History:  Social History   Socioeconomic History   Marital status: Single    Spouse name: Not on file   Number of children: Not on file   Years of education: Not on file   Highest education level: Not on  file  Occupational History   Not on file  Tobacco Use   Smoking status: Never   Smokeless tobacco: Never  Substance and Sexual Activity   Alcohol use: Not on file   Drug use: Not on file   Sexual activity: Not on file  Other Topics Concern   Not on file  Social History Narrative   Not on file   Social Determinants of Health   Financial Resource Strain: Not on file  Food Insecurity: Not on file  Transportation Needs: Not on file  Physical Activity: Not on file  Stress: Not on file  Social  Connections: Not on file  Intimate Partner Violence: Not on file    Medications:   Current Outpatient Medications on File Prior to Visit  Medication Sig Dispense Refill   aspirin 81 MG chewable tablet Chew 1 tablet (81 mg total) by mouth daily. 100 tablet 1   atorvastatin (LIPITOR) 40 MG tablet Take 1 tablet (40 mg total) by mouth daily. 90 tablet 0   cyanocobalamin 1000 MCG tablet Take 1 tablet (1,000 mcg total) by mouth daily. 30 tablet 0   ferrous sulfate 325 (65 FE) MG tablet Take 1 tablet (325 mg total) by mouth daily with breakfast. 30 tablet 3   Multiple Vitamin (MULTIVITAMIN WITH MINERALS) TABS tablet Take 1 tablet by mouth daily after supper. 30 tablet 0   No current facility-administered medications on file prior to visit.    Allergies:   Allergies  Allergen Reactions   Peanut-Containing Drug Products Itching    Physical Exam General: Obese young African-American lady, seated, in no evident distress Head: head normocephalic and atraumatic.   Neck: supple with no carotid or supraclavicular bruits Cardiovascular: regular rate and rhythm, no murmurs Musculoskeletal: no deformity Skin:  no rash/petichiae Vascular:  Normal pulses all extremities  Neurologic Exam Mental Status: Awake and fully alert. Oriented to place and time. Recent and remote memory intact. Attention span, concentration and fund of knowledge appropriate. Mood and affect appropriate.  Cranial Nerves: Fundoscopic exam reveals sharp disc margins. Pupils equal, briskly reactive to light. Extraocular movements full without nystagmus. Visual fields full to confrontation. Hearing intact. Facial sensation intact. Face, tongue, palate moves normally and symmetrically.  Motor: Normal bulk and tone. Normal strength in all tested extremity muscles. Sensory.: intact to touch , pinprick , position and vibratory sensation.  Coordination: Rapid alternating movements normal in all extremities. Finger-to-nose and  heel-to-shin performed accurately bilaterally. Gait and Station: Arises from chair without difficulty. Stance is normal. Gait demonstrates normal stride length and balance .  Favors left knee and is wearing left knee brace.  Able to heel, toe and tandem walk without difficulty.  Reflexes: 1+ and symmetric. Toes downgoing.   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  Vascular risk factors of obesity, hyperlipidemia and suspected sleep apnea.  She is doing well except mild residual left leg weakness.     PLAN:I had a long d/w patient about her recent  lacunar stroke, risk for recurrent stroke/TIAs, personally independently reviewed imaging studies and stroke evaluation results and answered questions.Continue aspirin 81 mg daily  for secondary stroke prevention and maintain strict control of hypertension with blood pressure goal below 130/90, diabetes with hemoglobin A1c goal below 6.5% and lipids with LDL cholesterol goal below 70 mg/dL. I also advised the patient to eat a healthy diet with plenty of whole  grains, cereals, fruits and vegetables, exercise regularly and maintain ideal body weight .check repeat lipid profile and vitamin B12 levels.  Refer for polysomnogram for sleep apnea.  Continue ongoing outpatient physical therapy.  Followup in the future with my nurse practitioner Janett Billow in 3 months or call earlier if necessary.  Greater than 50% time during this 50-minute consultation visit was spent on counseling and coordination of care about her lacunar stroke and discussion about stroke prevention treatment and answering questions. Antony Contras, MD  Note: This document was prepared with digital dictation and possible smart phrase technology. Any transcriptional errors that result from this process are unintentional.

## 2021-09-24 ENCOUNTER — Other Ambulatory Visit: Payer: Self-pay

## 2021-09-24 ENCOUNTER — Encounter: Payer: Self-pay | Admitting: Physical Therapy

## 2021-09-24 ENCOUNTER — Ambulatory Visit: Payer: BC Managed Care – PPO | Admitting: Physical Therapy

## 2021-09-24 DIAGNOSIS — M6281 Muscle weakness (generalized): Secondary | ICD-10-CM | POA: Diagnosis not present

## 2021-09-24 DIAGNOSIS — I639 Cerebral infarction, unspecified: Secondary | ICD-10-CM

## 2021-09-24 DIAGNOSIS — R2689 Other abnormalities of gait and mobility: Secondary | ICD-10-CM

## 2021-09-24 DIAGNOSIS — R2681 Unsteadiness on feet: Secondary | ICD-10-CM

## 2021-09-24 DIAGNOSIS — I69354 Hemiplegia and hemiparesis following cerebral infarction affecting left non-dominant side: Secondary | ICD-10-CM

## 2021-09-24 NOTE — Patient Instructions (Signed)
Access Code: [email or web address] URL: [email or web address] Date: 09/24/2021 Prepared by: Oley Balm  Exercises Supine Bridge with [employer] Between Knees - 1 x daily - 7 x weekly - 2 sets - 10 reps Supine Bridge with Resistance Band - 1 x daily - 7 x weekly - 2 sets - 10 reps Supine Short Arc Quad - 1 x daily - 7 x weekly - 2 sets - 10 reps

## 2021-09-24 NOTE — Therapy (Signed)
[facility] [address] [place], [place], [address] Phone: [phone]   Fax:  [phone]  Physical Therapy Treatment  Patient Details  Name: Danielle Kelley MRN: [id number] Date of Birth: Dec 20, 1980 Referring Provider (PT): Carlis Abbott Drema Pry, MD   Encounter Date: 09/24/2021   PT End of Session - 09/24/21 1308     Date for PT Re-Evaluation 09/04/21    PT Start Time 1230    PT Stop Time 1310    PT Time Calculation (min) 40 min    Activity Tolerance Patient tolerated treatment well    Behavior During Therapy [facility] for tasks assessed/performed             Past Medical History:  Diagnosis Date   Obesity    Seasonal allergies     Past Surgical History:  Procedure Laterality Date   ABDOMINAL SURGERY  04/06/2017   Gastric sleeve   BUBBLE STUDY N/A 06/26/2021   Procedure: BUBBLE STUDY;  Surgeon: Jodelle Red, MD;  Location: [facility] ENDOSCOPY;  Service: Cardiovascular;  Laterality: N/A;   CESAREAN SECTION     2004, 2006, 2007.   TEE WITHOUT CARDIOVERSION N/A 06/26/2021   Procedure: TRANSESOPHAGEAL ECHOCARDIOGRAM (TEE);  Surgeon: Jodelle Red, MD;  Location: [facility] ENDOSCOPY;  Service: Cardiovascular;  Laterality: N/A;    There were no vitals filed for this visit.   Subjective Assessment - 09/24/21 1233     Subjective Patient reports she is doing well, but she is feeling stressed about the Holidays. She is requesting to identify appropriate gym activities to further her progress.    Pertinent History CVA    Limitations Standing;Walking    How long can you walk comfortably? 35 minutes.    Patient Stated Goals better balance/feel more steady, feel stronger, not as tired.    Currently in Pain? No/denies                               [facility] Adult PT Treatment/Exercise - 09/24/21 0001       Knee/Hip Exercises: Aerobic   Tread Mill 1.5 x 5 minutes, 2.0 x additional 5 minutes with 1 x 40  seconds at 3% grade.                     PT Education - 09/24/21 1306     Education Details Updated HEP to address hip strength, TKE for knee pain.    Person(s) Educated Patient    Methods Explanation;Demonstration;Handout    Comprehension Verbalized understanding;Returned demonstration              PT Short Term Goals - 09/24/21 1312       PT SHORT TERM GOAL #1   Title Independent with initial HEP    Time 1    Period Weeks    Status Achieved      PT SHORT TERM GOAL #2   Title I with updated HEP    Baseline Program updated    Time 1    Period Weeks    Status On-going    Target Date 10/22/21               PT Long Term Goals - 09/24/21 1312       PT LONG TERM GOAL #1   Title Independent with advanced HEP    Baseline Updating program as she progresses.    Time 4    Period Weeks  Status On-going    Target Date 10/22/21      PT LONG TERM GOAL #2   Title 5TSTS improved to </= 12 seconds with symmetrical alignment and no UE support to demonstrate improved functional strength, endurance.    Baseline Unable due to L knee pain    Time 4    Period Weeks    Status On-going    Target Date 10/22/21      PT LONG TERM GOAL #3   Title TUG improved to </= 10 seconds with improved continuity of steps, no LOB, and minimal fatigue reported    Baseline deferred due to knee pain.    Time 4    Period Weeks    Target Date 10/22/21      PT LONG TERM GOAL #4   Title BERG improved to at least 50/56 to demonstrate improved balance, decreased falls risk    Baseline Re-assessment deferred today due to new onset of L knee pain.    Time 4    Period Weeks    Status On-going    Target Date 10/22/21      PT LONG TERM GOAL #5   Title LUE and LLE strength to at least 4+/5    Baseline LLE grossly 4-/5    Time 4    Period Weeks    Status On-going    Target Date 10/22/21                   Plan - 09/24/21 1309     Clinical Impression Statement  Patient and therapist agree patient is nearing her D/C. Howeve, she also reports new onset of L knee cap grinding. Updated HEP to address hip stability nad TKE. Plan to assess, educate her for sped of movment activities and gym equipment so that she can continue her progress after D/C.    Personal Factors and Comorbidities Fitness;Comorbidity 1    Comorbidities CVA    Examination-Activity Limitations Bend;Carry;Squat;Stairs;Lift    Examination-Participation Restrictions Community Activity;Occupation;Cleaning;Yard Work    Public house manager Low    Rehab Potential Good    PT Frequency 1x / week    PT Duration 4 weeks    PT Treatment/Interventions ADLs/Self Care Home Management;Electrical Stimulation;Moist Heat;Gait training;Functional mobility training;Stair training;Therapeutic activities;Therapeutic exercise;Balance training;Neuromuscular re-education;Patient/family education;Manual techniques;Energy conservation;Taping;Vestibular    PT Next Visit Plan Modify HEP as appropriate. Maintained today.    PT Home Exercise Plan [email or web address]    Consulted and Agree with Plan of Care Patient             Patient will benefit from skilled therapeutic intervention in order to improve the following deficits and impairments:  Abnormal gait, Difficulty walking, Decreased endurance, Decreased activity tolerance, Decreased balance, Decreased mobility, Decreased strength, Postural dysfunction  Visit Diagnosis: Muscle weakness (generalized)  Hemiplegia and hemiparesis following cerebral infarction affecting left non-dominant side (HCC)  Unsteadiness on feet  Acute CVA (cerebrovascular accident) (HCC)  Other abnormalities of gait and mobility     Problem List Patient Active Problem List   Diagnosis Date Noted   Vitamin B 12 deficiency 07/03/2021   Morbid obesity (HCC) 07/03/2021   New onset of headaches 07/03/2021   Stroke  (cerebrum) (HCC) 06/28/2021   Iron deficiency anemia    Seasonal allergies    Acute CVA (cerebrovascular accident) (HCC) 06/25/2021   Cerebrovascular accident (CVA) (HCC) 06/24/2021    Iona Beard, DPT 09/24/2021, 1:16 PM  [facility] [address]. [place], [place], [address] Phone: [phone]   Fax:  [phone]  Name: Cason Dabney MRN: [id number] Date of Birth: September 15, 1981

## 2021-09-30 ENCOUNTER — Ambulatory Visit: Payer: BC Managed Care – PPO | Admitting: Physical Therapy

## 2021-09-30 ENCOUNTER — Encounter: Payer: Self-pay | Admitting: Physical Therapy

## 2021-09-30 ENCOUNTER — Other Ambulatory Visit: Payer: Self-pay

## 2021-09-30 DIAGNOSIS — M6281 Muscle weakness (generalized): Secondary | ICD-10-CM

## 2021-09-30 DIAGNOSIS — I69354 Hemiplegia and hemiparesis following cerebral infarction affecting left non-dominant side: Secondary | ICD-10-CM

## 2021-09-30 DIAGNOSIS — R2689 Other abnormalities of gait and mobility: Secondary | ICD-10-CM

## 2021-09-30 DIAGNOSIS — I639 Cerebral infarction, unspecified: Secondary | ICD-10-CM

## 2021-09-30 DIAGNOSIS — R2681 Unsteadiness on feet: Secondary | ICD-10-CM

## 2021-09-30 NOTE — Therapy (Signed)
[facility] [address] [place], [place], [address] Phone: [phone]   Fax:  [phone]  Physical Therapy Treatment  Patient Details  Name: Danielle Kelley MRN: [id number] Date of Birth: 1981/08/26 Referring Provider (PT): Carlis Abbott Drema Pry, MD   Encounter Date: 09/30/2021   PT End of Session - 09/30/21 1558     Visit Number 11    Number of Visits 14    Date for PT Re-Evaluation 09/04/21    PT Start Time 1500    PT Stop Time 1543    PT Time Calculation (min) 43 min    Activity Tolerance Patient tolerated treatment well    Behavior During Therapy [facility] for tasks assessed/performed             Past Medical History:  Diagnosis Date   Obesity    Seasonal allergies     Past Surgical History:  Procedure Laterality Date   ABDOMINAL SURGERY  04/06/2017   Gastric sleeve   BUBBLE STUDY N/A 06/26/2021   Procedure: BUBBLE STUDY;  Surgeon: Jodelle Red, MD;  Location: [facility] ENDOSCOPY;  Service: Cardiovascular;  Laterality: N/A;   CESAREAN SECTION     2004, 2006, 2007.   TEE WITHOUT CARDIOVERSION N/A 06/26/2021   Procedure: TRANSESOPHAGEAL ECHOCARDIOGRAM (TEE);  Surgeon: Jodelle Red, MD;  Location: [facility] ENDOSCOPY;  Service: Cardiovascular;  Laterality: N/A;    There were no vitals filed for this visit.   Subjective Assessment - 09/30/21 1508     Subjective Patient reports work is busy, life is busy, but she is managing it ok. She is using exercise to manage stress and including meditation and deep breathing. She feels her leg is getting stronger.    Pertinent History CVA    Limitations Standing;Walking    How long can you walk comfortably? 35 minutes.    Patient Stated Goals better balance/feel more steady, feel stronger, not as tired.    Currently in Pain? No/denies                               [facility] Adult PT Treatment/Exercise - 09/30/21 0001       Knee/Hip Exercises: Aerobic    Tread Mill 2.5 x 5 minutes-warm up.      Knee/Hip Exercises: Machines for Strengthening   Cybex Knee Extension 25# LLE 2 x 10 reps    Cybex Knee Flexion 35# 2 x 10      Knee/Hip Exercises: Standing   Hip Flexion Stengthening;Both;1 set;10 reps    Hip Flexion Limitations G Tband    Hip ADduction Strengthening;Both;1 set;10 reps    Hip ADduction Limitations G Tband    Hip Abduction Stengthening;Both;1 set;10 reps    Abduction Limitations G Tband    Hip Extension Stengthening;Both;1 set;10 reps    Extension Limitations G Tband                     PT Education - 09/30/21 1558     Education Details Proper posture and stability during all exercises.    Person(s) Educated Patient    Methods Explanation;Demonstration    Comprehension Verbalized understanding;Returned demonstration              PT Short Term Goals - 09/30/21 1607       PT SHORT TERM GOAL #1   Title Independent with initial HEP    Time 1    Period Weeks  Status Achieved      PT SHORT TERM GOAL #2   Title I with updated HEP    Baseline Program updated    Time 1    Period Weeks    Status Achieved    Target Date 10/22/21               PT Long Term Goals - 09/24/21 1405       PT LONG TERM GOAL #1   Title Independent with advanced HEP    Baseline Updating program as she progresses.                   Plan - 09/30/21 1504     Clinical Impression Statement Therapist led patient through utilization of some of the equipment to re-familiarize herself. She reports she knows how to use the equipment, but does not trust her leg. Also educated her to additional exercises to engage multiple muscle and joint activation, including standing 4 way hip movements against G Tband resistance. Required occasional reminders for posture. She reported that she felt like she had a workout after treatment.    Personal Factors and Comorbidities Fitness;Comorbidity 1    Comorbidities CVA     Examination-Activity Limitations Bend;Carry;Squat;Stairs;Lift    Examination-Participation Restrictions Community Activity;Occupation;Cleaning;Yard Work    Public house manager Low    Rehab Potential Good    PT Frequency 1x / week    PT Duration 3 weeks    PT Treatment/Interventions ADLs/Self Care Home Management;Electrical Stimulation;Moist Heat;Gait training;Functional mobility training;Stair training;Therapeutic activities;Therapeutic exercise;Balance training;Neuromuscular re-education;Patient/family education;Manual techniques;Energy conservation;Taping;Vestibular    PT Next Visit Plan Include 4 way leg movements in stand in HEP. Identify additional multi joint exercises.    PT Home Exercise Plan [email or web address]    Consulted and Agree with Plan of Care Patient             Patient will benefit from skilled therapeutic intervention in order to improve the following deficits and impairments:  Abnormal gait, Difficulty walking, Decreased endurance, Decreased activity tolerance, Decreased balance, Decreased mobility, Decreased strength, Postural dysfunction  Visit Diagnosis: Muscle weakness (generalized)  Hemiplegia and hemiparesis following cerebral infarction affecting left non-dominant side (HCC)  Unsteadiness on feet  Acute CVA (cerebrovascular accident) (HCC)  Other abnormalities of gait and mobility     Problem List Patient Active Problem List   Diagnosis Date Noted   Vitamin B 12 deficiency 07/03/2021   Morbid obesity (HCC) 07/03/2021   New onset of headaches 07/03/2021   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 [place], [place],  [address] Phone: [phone]   Fax:  [phone]  Name: Karilynn Carranza MRN: [id number] Date of Birth: 04/16/1981

## 2021-10-07 ENCOUNTER — Encounter: Payer: Self-pay | Admitting: Physical Therapy

## 2021-10-07 ENCOUNTER — Other Ambulatory Visit: Payer: Self-pay

## 2021-10-07 ENCOUNTER — Ambulatory Visit: Payer: BC Managed Care – PPO | Attending: Physical Medicine and Rehabilitation | Admitting: Physical Therapy

## 2021-10-07 DIAGNOSIS — M6281 Muscle weakness (generalized): Secondary | ICD-10-CM | POA: Insufficient documentation

## 2021-10-07 DIAGNOSIS — R2681 Unsteadiness on feet: Secondary | ICD-10-CM | POA: Insufficient documentation

## 2021-10-07 DIAGNOSIS — I69354 Hemiplegia and hemiparesis following cerebral infarction affecting left non-dominant side: Secondary | ICD-10-CM | POA: Diagnosis present

## 2021-10-07 DIAGNOSIS — I639 Cerebral infarction, unspecified: Secondary | ICD-10-CM | POA: Insufficient documentation

## 2021-10-07 DIAGNOSIS — R2689 Other abnormalities of gait and mobility: Secondary | ICD-10-CM | POA: Diagnosis present

## 2021-10-07 NOTE — Therapy (Signed)
[facility] [address] [place], [place], [address] Phone: [phone]   Fax:  [phone]  Physical Therapy Treatment  Patient Details  Name: Danielle Kelley MRN: [id number] Date of Birth: September 21, 1981 Referring Provider (PT): Carlis Abbott Drema Pry, MD   Encounter Date: 10/07/2021   PT End of Session - 10/07/21 1916     Visit Number 12    Number of Visits 14    Date for PT Re-Evaluation 09/04/21    PT Start Time 1639    PT Stop Time 1717    PT Time Calculation (min) 38 min    Activity Tolerance Patient tolerated treatment well    Behavior During Therapy [facility] for tasks assessed/performed             Past Medical History:  Diagnosis Date   Obesity    Seasonal allergies     Past Surgical History:  Procedure Laterality Date   ABDOMINAL SURGERY  04/06/2017   Gastric sleeve   BUBBLE STUDY N/A 06/26/2021   Procedure: BUBBLE STUDY;  Surgeon: Jodelle Red, MD;  Location: [facility] ENDOSCOPY;  Service: Cardiovascular;  Laterality: N/A;   CESAREAN SECTION     2004, 2006, 2007.   TEE WITHOUT CARDIOVERSION N/A 06/26/2021   Procedure: TRANSESOPHAGEAL ECHOCARDIOGRAM (TEE);  Surgeon: Jodelle Red, MD;  Location: [facility] ENDOSCOPY;  Service: Cardiovascular;  Laterality: N/A;    There were no vitals filed for this visit.   Subjective Assessment - 10/07/21 1646     Subjective Patient reports she went to the gym this morning. She likes the hip thrust, has also been working on knee extensions. She rode stationary bike and was able to balance on a bike seat.    Currently in Pain? No/denies                               [facility] Adult PT Treatment/Exercise - 10/07/21 0001       High Level Balance   High Level Balance Comments Speed of movement activities, jumping into ABD/ADD. Patient had much difficulty with LLE keeping up with R. Performed quick steps forward and back.      Knee/Hip Exercises: Aerobic    Nustep L5 x 3 minutes. 2 X 30 seconds at L8, with emphasis on push.      Knee/Hip Exercises: Machines for Strengthening   Cybex Knee Flexion 45# 2 x 10, 25#, LLE 2 x 10 reps.                     PT Education - 10/07/21 1714     Education Details Introduced speed of movment activities for LLE motor control which patient can safely perform at home including quick steps into abd/add front back, skicking in the pool, riding a bike at high RPM x 30 seconds at a time.    Person(s) Educated Patient    Methods Explanation;Demonstration    Comprehension Verbalized understanding;Returned demonstration              PT Short Term Goals - 09/30/21 1607       PT SHORT TERM GOAL #1   Title Independent with initial HEP    Time 1    Period Weeks    Status Achieved      PT SHORT TERM GOAL #2   Title I with updated HEP    Baseline Program updated    Time 1    Period Weeks  Status Achieved    Target Date 10/22/21               PT Long Term Goals - 10/07/21 1919       PT LONG TERM GOAL #1   Title Independent with advanced HEP    Baseline Updating program as she progresses.    Time 4    Period Weeks    Status On-going      PT LONG TERM GOAL #2   Title 5TSTS improved to </= 12 seconds with symmetrical alignment and no UE support to demonstrate improved functional strength, endurance.    Baseline Unable due to L knee pain    Time 2    Period Weeks    Status On-going      PT LONG TERM GOAL #3   Title TUG improved to </= 10 seconds with improved continuity of steps, no LOB, and minimal fatigue reported    Baseline deferred due to knee pain.    Time 2    Period Weeks      PT LONG TERM GOAL #4   Title BERG improved to at least 50/56 to demonstrate improved balance, decreased falls risk    Baseline Re-assessment deferred today due to new onset of L knee pain.    Time 2    Period Weeks    Status On-going      PT LONG TERM GOAL #5   Title LUE and LLE strength  to at least 4+/5    Baseline LLE grossly 4-/5    Time 2    Period Weeks    Status On-going                   Plan - 10/07/21 1917     Clinical Impression Statement Patient is progressing very well toward LTG. She has initiated return to the gym. Treatment focused on LLE control and coordination for balance and safety. She still has some uncoordinated movements in LLE at hight=er speeds.    Personal Factors and Comorbidities Fitness;Comorbidity 1    Comorbidities CVA    Examination-Activity Limitations Bend;Carry;Squat;Stairs;Lift    Examination-Participation Restrictions Community Activity;Occupation;Cleaning;Yard Work    Public house manager Low    Rehab Potential Good    PT Frequency 1x / week    PT Duration 2 weeks    PT Treatment/Interventions ADLs/Self Care Home Management;Electrical Stimulation;Moist Heat;Gait training;Functional mobility training;Stair training;Therapeutic activities;Therapeutic exercise;Balance training;Neuromuscular re-education;Patient/family education;Manual techniques;Energy conservation;Taping;Vestibular    PT Next Visit Plan Re-assess status    PT Home Exercise Plan [email or web address]    Consulted and Agree with Plan of Care Patient             Patient will benefit from skilled therapeutic intervention in order to improve the following deficits and impairments:  Abnormal gait, Difficulty walking, Decreased endurance, Decreased activity tolerance, Decreased balance, Decreased mobility, Decreased strength, Postural dysfunction  Visit Diagnosis: Muscle weakness (generalized)  Hemiplegia and hemiparesis following cerebral infarction affecting left non-dominant side (HCC)  Unsteadiness on feet  Acute CVA (cerebrovascular accident) (HCC)  Other abnormalities of gait and mobility     Problem List Patient Active Problem List   Diagnosis Date Noted   Vitamin B 12 deficiency  07/03/2021   Morbid obesity (HCC) 07/03/2021   New onset of headaches 07/03/2021   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 [place], [place], [address] Phone: [phone]   Fax:  [phone]  Name: Kaitland Lewellyn MRN: [id number] Date of Birth: 1981-04-30

## 2021-10-14 ENCOUNTER — Ambulatory Visit: Payer: BC Managed Care – PPO | Admitting: Physical Therapy

## 2021-10-21 ENCOUNTER — Encounter: Payer: Self-pay | Admitting: Physical Therapy

## 2021-10-21 ENCOUNTER — Other Ambulatory Visit: Payer: Self-pay

## 2021-10-21 ENCOUNTER — Ambulatory Visit: Payer: BC Managed Care – PPO | Admitting: Physical Therapy

## 2021-10-21 DIAGNOSIS — R2689 Other abnormalities of gait and mobility: Secondary | ICD-10-CM

## 2021-10-21 DIAGNOSIS — M6281 Muscle weakness (generalized): Secondary | ICD-10-CM

## 2021-10-21 DIAGNOSIS — I69354 Hemiplegia and hemiparesis following cerebral infarction affecting left non-dominant side: Secondary | ICD-10-CM

## 2021-10-21 DIAGNOSIS — I639 Cerebral infarction, unspecified: Secondary | ICD-10-CM

## 2021-10-21 DIAGNOSIS — R2681 Unsteadiness on feet: Secondary | ICD-10-CM

## 2021-10-21 NOTE — Patient Instructions (Signed)
Access Code: [email or web address] URL: [email or web address] Date: 10/21/2021 Prepared by: Oley Balm  Exercises Standard Plank - 1 x daily - 7 x weekly - 1 sets - 5 reps - 15 hold Side Plank on Elbow - 1 x daily - 7 x weekly - 1 sets - 5 reps

## 2021-10-21 NOTE — Therapy (Signed)
[facility] [place] [facility] [address] [place], [place], [address] Phone: [phone]   Fax:  [phone]  Physical Therapy Treatment PHYSICAL THERAPY DISCHARGE SUMMARY  Visits from Start of Care: 13  Current functional level related to goals / functional outcomes: Goals met   Remaining deficits: Mild L patellar grinding, mild balance deficits at higher level activities.   Education / Equipment: HEP with extensive options for strength, core stability, balance.   Patient agrees to discharge. Patient goals were met. Patient is being discharged due to being pleased with the current functional level.  Patient Details  Name: Danielle Kelley MRN: [id number] Date of Birth: 1981-05-25 Referring Provider (PT): Carlis Abbott Drema Pry, MD   Encounter Date: 10/21/2021   PT End of Session - 10/21/21 1722     Visit Number 13    Number of Visits 14    Date for PT Re-Evaluation 09/04/21    PT Start Time 1630    PT Stop Time 1719    PT Time Calculation (min) 49 min    Activity Tolerance Patient tolerated treatment well    Behavior During Therapy [facility] for tasks assessed/performed             Past Medical History:  Diagnosis Date   Obesity    Seasonal allergies     Past Surgical History:  Procedure Laterality Date   ABDOMINAL SURGERY  04/06/2017   Gastric sleeve   BUBBLE STUDY N/A 06/26/2021   Procedure: BUBBLE STUDY;  Surgeon: Jodelle Red, MD;  Location: [facility] ENDOSCOPY;  Service: Cardiovascular;  Laterality: N/A;   CESAREAN SECTION     2004, 2006, 2007.   TEE WITHOUT CARDIOVERSION N/A 06/26/2021   Procedure: TRANSESOPHAGEAL ECHOCARDIOGRAM (TEE);  Surgeon: Jodelle Red, MD;  Location: [facility] ENDOSCOPY;  Service: Cardiovascular;  Laterality: N/A;    There were no vitals filed for this visit.   Subjective Assessment - 10/21/21 1636     Subjective Patient has been going to the gym, but did not go this week. She is still doing  the stepper daily. She feels she is ready to take over her rehab at this point.    Currently in Pain? No/denies                               [facility] Adult PT Treatment/Exercise - 10/21/21 0001       Knee/Hip Exercises: Aerobic   Tread Mill 4 minutes at 1.5, increased to 2.5 x 30 seconds, x 2 reps.      Knee/Hip Exercises: Standing   Other Standing Knee Exercises TKE in stand- first pulled against red T band 2 x 10- had to work hard to control knee and prevent hyperextention. She then performed 2 x 10 reps of ball squeeze with the back of her leg.      Knee/Hip Exercises: Sidelying   Other Sidelying Knee/Hip Exercises side planks with support under knees. 3 x 5 seconds.      Knee/Hip Exercises: Prone   Other Prone Exercises Planks 3 x 15 seconds                     PT Education - 10/21/21 1721     Education Details Updated HEP to include TKE and planking for core strength and stability. Also demosntrated SLS for balance.    Person(s) Educated Patient;Child(ren)    Methods Demonstration;Explanation;Handout    Comprehension Verbalized understanding;Returned demonstration  PT Short Term Goals - 09/30/21 1607       PT SHORT TERM GOAL #1   Title Independent with initial HEP    Time 1    Period Weeks    Status Achieved      PT SHORT TERM GOAL #2   Title I with updated HEP    Baseline Program updated    Time 1    Period Weeks    Status Achieved    Target Date 10/22/21               PT Long Term Goals - 10/21/21 1724       PT LONG TERM GOAL #1   Title Independent with advanced HEP    Baseline Updating program as she progresses.    Time 4    Period Weeks    Status Achieved      PT LONG TERM GOAL #2   Title 5TSTS improved to </= 12 seconds with symmetrical alignment and no UE support to demonstrate improved functional strength, endurance.    Baseline Unable due to L knee pain    Time 2    Period Weeks    Status  Achieved      PT LONG TERM GOAL #3   Title TUG improved to </= 10 seconds with improved continuity of steps, no LOB, and minimal fatigue reported    Baseline deferred due to knee pain.    Time 2    Period Weeks    Status Achieved      PT LONG TERM GOAL #4   Title BERG improved to at least 50/56 to demonstrate improved balance, decreased falls risk    Baseline Re-assessment deferred today due to new onset of L knee pain.    Time 2    Period Weeks    Status Unable to assess      PT LONG TERM GOAL #5   Title LUE and LLE strength to at least 4+/5    Time 2    Period Weeks    Status Achieved                   Plan - 10/21/21 1723     Clinical Impression Statement Patient feels she is ready to take over her recovery at the gym and at home. Updated HEP to include additional core stabilizing activities, TKE, and balance. She now has multiple exercises to perform in additin to equipment at gym.    Personal Factors and Comorbidities Fitness;Comorbidity 1    Comorbidities CVA    Examination-Activity Limitations Bend;Carry;Squat;Stairs;Lift    Examination-Participation Restrictions Community Activity;Occupation;Cleaning;Yard Work    [employer] officer Evolving/Moderate complexity    Rehab Potential Good    PT Frequency 1x / week    PT Duration 2 weeks    PT Treatment/Interventions ADLs/Self Care Home Management;Electrical Stimulation;Moist Heat;Gait training;Functional mobility training;Stair training;Therapeutic activities;Therapeutic exercise;Balance training;Neuromuscular re-education;Patient/family education;Manual techniques;Energy conservation;Taping;Vestibular    PT Next Visit Plan Re-assess status    PT Home Exercise Plan [email or web address]    Consulted and Agree with Plan of Care Patient             Patient will benefit from skilled therapeutic intervention in order to improve the following deficits and impairments:  Abnormal gait, Difficulty walking,  Decreased endurance, Decreased activity tolerance, Decreased balance, Decreased mobility, Decreased strength, Postural dysfunction  Visit Diagnosis: Hemiplegia and hemiparesis following cerebral infarction affecting left non-dominant side (HCC)  Muscle weakness (generalized)  Unsteadiness on feet  Acute  CVA (cerebrovascular accident) [facility])  Other abnormalities of gait and mobility     Problem List Patient Active Problem List   Diagnosis Date Noted   Vitamin B 12 deficiency 07/03/2021   Morbid obesity ([place]) 07/03/2021   New onset of headaches 07/03/2021   Stroke (cerebrum) ([place]) 06/28/2021   Iron deficiency anemia    Seasonal allergies    Acute CVA (cerebrovascular accident) ([place]) 06/25/2021   Cerebrovascular accident (CVA) ([place]) 06/24/2021    Marcelina Morel, DPT 10/21/2021, 5:26 PM  [place]. [place], [place], [address] Phone: [phone]   Fax:  [phone]  Name: Audryanna Zurita MRN: [id number] Date of Birth: 07-08-1981

## 2021-10-29 ENCOUNTER — Ambulatory Visit: Payer: BC Managed Care – PPO | Admitting: Physical Therapy

## 2021-11-14 ENCOUNTER — Encounter: Payer: BC Managed Care – PPO | Admitting: Physical Medicine and Rehabilitation

## 2021-11-26 ENCOUNTER — Encounter
Payer: BC Managed Care – PPO | Attending: Physical Medicine and Rehabilitation | Admitting: Physical Medicine and Rehabilitation

## 2021-11-26 ENCOUNTER — Other Ambulatory Visit: Payer: Self-pay

## 2021-11-26 VITALS — BP 117/69 | HR 74 | Ht 70.0 in | Wt 282.0 lb

## 2021-11-26 DIAGNOSIS — R519 Headache, unspecified: Secondary | ICD-10-CM | POA: Insufficient documentation

## 2021-11-26 DIAGNOSIS — R4189 Other symptoms and signs involving cognitive functions and awareness: Secondary | ICD-10-CM | POA: Diagnosis present

## 2021-11-26 DIAGNOSIS — I63431 Cerebral infarction due to embolism of right posterior cerebral artery: Secondary | ICD-10-CM | POA: Insufficient documentation

## 2021-11-26 NOTE — Progress Notes (Signed)
Subjective:    Patient ID: Danielle Kelley, female    DOB: [birth year], 41 y.o.   MRN: [id number]  HPI Mrs. Danielle Kelley is a 41 year old woman who presents for hospital follow-up after CIR admission for CVA.  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 Screens worsen her headaches.  -she is exhausted after bringing her kids home. -she is experiencing a lack of patience.  -she removes herself from difficult conversations -at home she would rather be in the room by herself which is not like her.  -this has not impacted her kids much but she distance herself from family members she couldn't tolerate.   -she has more clear boundaries.   2) Morbid obesity -weight is 302 lbs today -she does intermittent fasting -she eats between 12 and 6 -she has started to walk outside with her kids  3) Headaches: -topamax is helping   4) cognitive impairment -persists -never heard from SLP -she has to give presentations at work, it is hard for her to this- she has to go to trainings twice.  -she loses her train of thought very easily -pre-stroke subjects she has to think about harder but is able to compensat.   Pain Inventory Average Pain 3 Pain Right Now 0 My pain is tingling and aching  LOCATION OF PAIN  head, knee  BOWEL Number of stools per week: 5 Oral laxative use No  Type of laxative na Enema or suppository use No  History of colostomy No  Incontinent No   BLADDER Normal In and out cath, frequency na Able to self cath  na Bladder incontinence No  Frequent urination No  Leakage with coughing No  Difficulty starting stream No  Incomplete bladder emptying No    Mobility how many minutes can you walk? 15 ability to climb steps?  yes do you  drive?  yes  Function employed # of hrs/week .  Neuro/Psych weakness numbness tremor tingling trouble walking dizziness confusion  Prior Studies Hospital f/u  Physicians involved in your care Hospital f/u   Family History  Problem Relation Age of Onset   Stroke Mother    Hypertension Mother    Hypertension Maternal Grandmother    Hypertension Maternal Grandfather    Social History   Socioeconomic History   Marital status: Single    Spouse name: Not on file   Number of children: Not on file   Years of education: Not on file   Highest education level: Not on file  Occupational History   Not on file  Tobacco Use   Smoking status: Never   Smokeless tobacco: Never  Substance and Sexual Activity   Alcohol use: Not on file   Drug use: Not on file   Sexual activity: Not on file  Other Topics Concern   Not on file  Social History Narrative   Not on file   Social Determinants of Health   Financial Resource Strain: Not on file  Food Insecurity: Not on file  Transportation Needs: Not on file  Physical Activity: Not on file  Stress: Not on file  Social Connections: Not on file   Past Surgical History:  Procedure Laterality Date   ABDOMINAL SURGERY  04/06/2017   Gastric sleeve  BUBBLE STUDY N/A 06/26/2021   Procedure: BUBBLE STUDY;  Surgeon: Jodelle Red, MD;  Location: [facility] ENDOSCOPY;  Service: Cardiovascular;  Laterality: N/A;   CESAREAN SECTION     2004, 2006, 2007.   TEE WITHOUT CARDIOVERSION N/A 06/26/2021   Procedure: TRANSESOPHAGEAL ECHOCARDIOGRAM (TEE);  Surgeon: Jodelle Red, MD;  Location: [facility] ENDOSCOPY;  Service: Cardiovascular;  Laterality: N/A;   Past Medical History:  Diagnosis Date   Obesity    Seasonal allergies    There were no vitals taken for this visit.  Opioid Risk Score:   Fall Risk Score:  `1  Depression screen PHQ 2/9  Depression screen PHQ 2/9 07/31/2021  Decreased Interest 0  Down, Depressed, Hopeless 0   PHQ - 2 Score 0  Altered sleeping 1  Tired, decreased energy 2  Change in appetite 1  Feeling bad or failure about yourself  0  Trouble concentrating 2  Moving slowly or fidgety/restless 2  Suicidal thoughts 0  PHQ-9 Score 8  Difficult doing work/chores Somewhat difficult     Review of Systems  Constitutional: Negative.   HENT: Negative.    Eyes: Negative.   Respiratory: Negative.    Endocrine: Negative.   Genitourinary: Negative.   Allergic/Immunologic: Negative.   Neurological: Negative.       Objective:   Physical Exam Gen: no distress, normal appearing HEENT: oral mucosa pink and moist, NCAT Cardio: Reg rate Chest: normal effort, normal rate of breathing Abd: soft, non-distended Ext: no edema Psych: pleasant, normal affect Skin: intact Neuro: Word finding difficulties, impaired attention, impaired memory    Assessment & Plan:  Mrs. Danielle Kelley is a 41 year old woman who presents for hospital follow-up after CIR admission for CVA.  1) CVA -MRI Brain reviewed and shows a small acute infarct in the periventricular right posterior frontal corona radiata, immediately superior to and potentially involving the right thalamus. Faint edema without mass effect. MRA shows no large vessel occlusion or proximal hemodynamically significant stenosis. -continue therapies -continue using cane in stores to minimize fall risk.  -provided dietary and exercise counseling -discussed that hypertension is number one reversible risk factor for stroke.  -recommended NAC or Cefarolin NAC for fatigue and cognitive impairments and she plans to use latter.  -provided counseling regarding return to work, currently planning to return October 24th- at this time we discussed starting with half days to help minimize her screen time.  -Has returned to work.   2) Obesity: -Educated that current weight is 282 lbs, lost 20 lbs since last visit! -Educated regarding health benefits of weight loss- for  pain, general health, chronic disease prevention, immune health, mental health.  -Will monitor weight every visit.  -Consider Roobois tea daily.  -Discussed the benefits of intermittent fasting. -Discussed foods that can assist in weight loss: 1) leafy greens- high in fiber and nutrients 2) dark chocolate- improves metabolism (if prefer sweetened, best to sweeten with honey instead of sugar).  3) cruciferous vegetables- high in fiber and protein 4) full fat yogurt: high in healthy fat, protein, calcium, and probiotics 5) apples- high in a variety of phytochemicals 6) nuts- high in fiber and protein that increase feelings of fullness 7) grapefruit: rich in nutrients, antioxidants, and fiber (not to be taken with anticoagulation) 8) beans- high in protein and fiber 9) salmon- has high quality protein and healthy fats 10) green tea- rich in polyphenols 11) eggs- rich in choline and vitamin D 12) tuna- high protein, boosts metabolism 13) avocado- decreases visceral abdominal fat 14)  chicken (pasture raised): high in protein and iron 15) blueberries- reduce abdominal fat and cholesterol 16) whole grains- decreases calories retained during digestion, speeds metabolism 17) chia seeds- curb appetite 18) chilies- increases fat metabolism  -Discussed supplements that can be used:  1) Metatrim 400mg  BID 30 minutes before breakfast and dinner  2) Sphaeranthus indicus and Garcinia mangostana (combinations of these and #1 can be found in capsicum and zychrome  3) green coffee bean extract 400mg  twice per day or Irvingia (african mango) 150 to 300mg  twice per day.   3) Headaches -continue Topamax, provided with refill  4) Prediabetes: Diabetes: -check CBGs daily, log, and bring log to follow-up appointment -avoid sugar, bread, pasta, rice -avoid snacking -perform daily foot exam and at least annual eye exam -try to incorporate into your diet some of the following foods which are good for  diabetes: 1) cinnamon- imitates effects of insulin, increasing glucose transport into cells ([place] or [place] cinnamon is best, least processed) 2) nuts- can slow down the blood sugar response of carbohydrate rich foods 3) oatmeal- contains and anti-inflammatory compound avenanthramide 4) whole-milk yogurt (best types are no sugar, [place] yogurt, or goat/sheep yogurt) 5) beans- high in protein, fiber, and vitamins, low glycemic index 6) broccoli- great source of vitamin A and C 7) quinoa- higher in protein and fiber than other grains 8) spinach- high in vitamin A, fiber, and protein 9) olive oil- reduces glucose levels, LDL, and triglycerides 10) salmon- excellent amount of omega-3-fatty acids 11) walnuts- rich in antioxidants 12) apples- high in fiber and quercetin 13) carrots- highly nutritious with low impact on blood sugar 14) eggs- improve HDL (good cholesterol), high in protein, keep you satiated 15) turmeric: improves blood sugars, cardiovascular disease, and protects kidney health 16) garlic: improves blood sugar, blood pressure, pain 17) tomatoes: highly nutritious with low impact on blood sugar   5) Cognitive impairment: -prescribed SLO -discussed her current cognitive difficulties. -discussed Cefazolin NAC -Recommended starting NAC (N-Acetyl cysteine) 600mg  BID for her fatigue. Discussed its benefits in boosting glutathione and mitochondrial function.  -discussed Ritalin and Modafinil.

## 2021-11-26 NOTE — Patient Instructions (Addendum)
Cefazolin NAC  NAC 600mg  BID   Foods that can assist in weight loss: 1) leafy greens- high in fiber and nutrients 2) dark chocolate- improves metabolism (if prefer sweetened, best to sweeten with honey instead of sugar).  3) cruciferous vegetables- high in fiber and protein 4) full fat yogurt: high in healthy fat, protein, calcium, and probiotics 5) apples- high in a variety of phytochemicals 6) nuts- high in fiber and protein that increase feelings of fullness 7) grapefruit: rich in nutrients, antioxidants, and fiber (not to be taken with anticoagulation) 8) beans- high in protein and fiber 9) salmon- has high quality protein and healthy fats 10) green tea- rich in polyphenols 11) eggs- rich in choline and vitamin D 12) tuna- high protein, boosts metabolism 13) avocado- decreases visceral abdominal fat 14) chicken (pasture raised): high in protein and iron 15) blueberries- reduce abdominal fat and cholesterol 16) whole grains- decreases calories retained during digestion, speeds metabolism 17) chia seeds- curb appetite 18) chilies- increases fat metabolism  -Discussed supplements that can be used:  1) Metatrim 400mg  BID 30 minutes before breakfast and dinner  2) Sphaeranthus indicus and Garcinia mangostana (combinations of these and #1 can be found in capsicum and zychrome  3) green coffee bean extract 400mg  twice per day or Irvingia (african mango) 150 to 300mg  twice per day.  Prediabetes: -avoid sugar, bread, pasta, rice -avoid snacking -try to incorporate into your diet some of the following foods which are good for diabetes: 1) cinnamon- imitates effects of insulin, increasing glucose transport into cells ( or cinnamon is best, least processed) 2) nuts- can slow down the blood sugar response of carbohydrate rich foods 3) oatmeal- contains and anti-inflammatory compound avenanthramide 4) whole-milk yogurt (best types are no sugar, yogurt, or goat/sheep  yogurt) 5) beans- high in protein, fiber, and vitamins, low glycemic index 6) broccoli- great source of vitamin A and C 7) quinoa- higher in protein and fiber than other grains 8) spinach- high in vitamin A, fiber, and protein 9) olive oil- reduces glucose levels, LDL, and triglycerides 10) salmon- excellent amount of omega-3-fatty acids 11) walnuts- rich in antioxidants 12) apples- high in fiber and quercetin 13) carrots- highly nutritious with low impact on blood sugar 14) eggs- improve HDL (good cholesterol), high in protein, keep you satiated 15) turmeric: improves blood sugars, cardiovascular disease, and protects kidney health 16) garlic: improves blood sugar, blood pressure, pain 17) tomatoes: highly nutritious with low impact on blood sugar

## 2021-11-26 NOTE — Progress Notes (Signed)
Subjective:    Patient ID: Danielle Kelley, female    DOB: 1981/06/15, 41 y.o.   MRN: [id number]  HPI Mrs. Elbe is a 41 year old woman who presents for hospital follow-up after CIR admission for CVA.  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 Screens worsen her headaches.  -she is exhausted after bringing her kids home.   2) Morbid obesity -weight is 302 lbs today -she does intermittent fasting -she eats between 12 and 6 -she has started to walk outside with her kids  3) Headaches: -topamax is helping   Pain Inventory Average Pain 3 Pain Right Now 0 My pain is dull and tingling  LOCATION OF PAIN  head, knee  BOWEL Number of stools per week: 5 Oral laxative use No  Type of laxative na Enema or suppository use No  History of colostomy No  Incontinent No   BLADDER Normal In and out cath, frequency na Able to self cath  na Bladder incontinence No  Frequent urination No  Leakage with coughing No  Difficulty starting stream No  Incomplete bladder emptying No    Mobility how many minutes can you walk? 15 ability to climb steps?  yes do you drive?  yes  Function employed # of hrs/week .  Neuro/Psych weakness numbness tremor tingling trouble walking dizziness confusion  Prior Studies Hospital f/u  Physicians involved in your care Hospital f/u   Family History  Problem Relation Age of Onset   Stroke Mother    Hypertension Mother    Hypertension Maternal Grandmother    Hypertension Maternal Grandfather    Social History   Socioeconomic History   Marital status: Single    Spouse name: Not on file   Number of children: Not on file   Years of education: Not on file   Highest education level: Not on file   Occupational History   Not on file  Tobacco Use   Smoking status: Never   Smokeless tobacco: Never  Substance and Sexual Activity   Alcohol use: Not on file   Drug use: Not on file   Sexual activity: Not on file  Other Topics Concern   Not on file  Social History Narrative   Not on file   Social Determinants of Health   Financial Resource Strain: Not on file  Food Insecurity: Not on file  Transportation Needs: Not on file  Physical Activity: Not on file  Stress: Not on file  Social Connections: Not on file   Past Surgical History:  Procedure Laterality Date   ABDOMINAL SURGERY  04/06/2017   Gastric sleeve   BUBBLE STUDY N/A 06/26/2021   Procedure: BUBBLE STUDY;  Surgeon: Jodelle Red, MD;  Location[facility] ENDOSCOPY;  Service: Cardiovascular;  Laterality: N/A;   CESAREAN SECTION     2004, 2006, 2007.   TEE WITHOUT CARDIOVERSION N/A 06/26/2021   Procedure: TRANSESOPHAGEAL ECHOCARDIOGRAM (TEE);  Surgeon: Jodelle Red, MD;  Location: [facility] ENDOSCOPY;  Service: Cardiovascular;  Laterality: N/A;   Past Medical History:  Diagnosis Date   Obesity    Seasonal allergies    BP 117/69    Pulse 74    Ht 5\' 10"  (1.778 m)    Wt 282 lb (127.9 kg)  SpO2 98%    BMI 40.46 kg/m   Opioid Risk Score:   Fall Risk Score:  `1  Depression screen PHQ 2/9  Depression screen PHQ 2/9 07/31/2021  Decreased Interest 0  Down, Depressed, Hopeless 0  PHQ - 2 Score 0  Altered sleeping 1  Tired, decreased energy 2  Change in appetite 1  Feeling bad or failure about yourself  0  Trouble concentrating 2  Moving slowly or fidgety/restless 2  Suicidal thoughts 0  PHQ-9 Score 8  Difficult doing work/chores Somewhat difficult     Review of Systems  Constitutional: Negative.   HENT: Negative.    Eyes: Negative.   Respiratory: Negative.    Endocrine: Negative.   Genitourinary: Negative.   Allergic/Immunologic: Negative.   Neurological: Negative.       Objective:   Physical  Exam Gen: no distress, normal appearing HEENT: oral mucosa pink and moist, NCAT Cardio: Reg rate Chest: normal effort, normal rate of breathing Abd: soft, non-distended Ext: no edema Psych: pleasant, normal affect Skin: intact Neuro: Word finding difficulties, impaired attention    Assessment & Plan:  Mrs. Fendrick is a 41 year old woman who presents for hospital follow-up after CIR admission for CVA.  1) CVA -MRI Brain reviewed and shows a small acute infarct in the periventricular right posterior frontal corona radiata, immediately superior to and potentially involving the right thalamus. Faint edema without mass effect. MRA shows no large vessel occlusion or proximal hemodynamically significant stenosis. -continue therapies -continue using cane in stores to minimize fall risk.  -provided dietary and exercise counseling -discussed that hypertension is number one reversible risk factor for stroke.  -recommended NAC or Cefarolin NAC for fatigue and cognitive impairments and she plans to use latter.  -provided counseling regarding return to work, currently planning to return October 24th- at this time we discussed starting with half days to help minimize her screen time.  -Will complete her disability paperwork for her -Provided a note to   2) Obesity: -Educated that current weight is 302 lbs -continue intermittent fasting.  -Educated regarding health benefits of weight loss- for pain, general health, chronic disease prevention, immune health, mental health.  -Will monitor weight every visit.  -Consider Roobois tea daily.  -Discussed the benefits of intermittent fasting. -Discussed foods that can assist in weight loss: 1) leafy greens- high in fiber and nutrients 2) dark chocolate- improves metabolism (if prefer sweetened, best to sweeten with honey instead of sugar).  3) cruciferous vegetables- high in fiber and protein 4) full fat yogurt: high in healthy fat, protein, calcium, and  probiotics 5) apples- high in a variety of phytochemicals 6) nuts- high in fiber and protein that increase feelings of fullness 7) grapefruit: rich in nutrients, antioxidants, and fiber (not to be taken with anticoagulation) 8) beans- high in protein and fiber 9) salmon- has high quality protein and healthy fats 10) green tea- rich in polyphenols 11) eggs- rich in choline and vitamin D 12) tuna- high protein, boosts metabolism 13) avocado- decreases visceral abdominal fat 14) chicken (pasture raised): high in protein and iron 15) blueberries- reduce abdominal fat and cholesterol 16) whole grains- decreases calories retained during digestion, speeds metabolism 17) chia seeds- curb appetite 18) chilies- increases fat metabolism  -Discussed supplements that can be used:  1) Metatrim 400mg  BID 30 minutes before breakfast and dinner  2) Sphaeranthus indicus and Garcinia mangostana (combinations of these and #1 can be found in capsicum and zychrome  3) green coffee bean extract  400mg  twice per day or Irvingia (african mango) 150 to 300mg  twice per day.  3) Headaches -continue Topamax, provided with refill  4) Diabetes: -avoid sugar, bread, pasta, rice -avoid snacking -try to incorporate into your diet some of the following foods which are good for diabetes: 1) cinnamon- imitates effects of insulin, increasing glucose transport into cells ( or cinnamon is best, least processed) 2) nuts- can slow down the blood sugar response of carbohydrate rich foods 3) oatmeal- contains and anti-inflammatory compound avenanthramide 4) whole-milk yogurt (best types are no sugar, [place] yogurt, or goat/sheep yogurt) 5) beans- high in protein, fiber, and vitamins, low glycemic index 6) broccoli- great source of vitamin A and C 7) quinoa- higher in protein and fiber than other grains 8) spinach- high in vitamin A, fiber, and protein 9) olive oil- reduces glucose levels, LDL, and  triglycerides 10) salmon- excellent amount of omega-3-fatty acids 11) walnuts- rich in antioxidants 12) apples- high in fiber and quercetin 13) carrots- highly nutritious with low impact on blood sugar 14) eggs- improve HDL (good cholesterol), high in protein, keep you satiated 15) turmeric: improves blood sugars, cardiovascular disease, and protects kidney health 16) garlic: improves blood sugar, blood pressure, pain 17) tomatoes: highly nutritious with low impact on blood sugar   40 minutes spent in review of her chart, providing stroke education, reviewing Brain MRI and discussing results with patient, providing with list of foods for weight loss, speech therapy, cognitive impairments,

## 2021-12-02 ENCOUNTER — Institutional Professional Consult (permissible substitution): Payer: BC Managed Care – PPO | Admitting: Neurology

## 2021-12-03 ENCOUNTER — Ambulatory Visit (INDEPENDENT_AMBULATORY_CARE_PROVIDER_SITE_OTHER): Payer: BC Managed Care – PPO | Admitting: Neurology

## 2021-12-03 ENCOUNTER — Encounter: Payer: Self-pay | Admitting: Neurology

## 2021-12-03 ENCOUNTER — Other Ambulatory Visit: Payer: Self-pay

## 2021-12-03 VITALS — BP 132/86 | HR 66 | Ht 70.0 in | Wt 281.8 lb

## 2021-12-03 DIAGNOSIS — G4719 Other hypersomnia: Secondary | ICD-10-CM

## 2021-12-03 DIAGNOSIS — R0683 Snoring: Secondary | ICD-10-CM

## 2021-12-03 DIAGNOSIS — R634 Abnormal weight loss: Secondary | ICD-10-CM

## 2021-12-03 DIAGNOSIS — R519 Headache, unspecified: Secondary | ICD-10-CM

## 2021-12-03 DIAGNOSIS — Z823 Family history of stroke: Secondary | ICD-10-CM

## 2021-12-03 DIAGNOSIS — I6381 Other cerebral infarction due to occlusion or stenosis of small artery: Secondary | ICD-10-CM | POA: Diagnosis not present

## 2021-12-03 NOTE — Patient Instructions (Signed)

## 2021-12-03 NOTE — Progress Notes (Signed)
Subjective:    Patient ID: Danielle Kelley is a 41 y.o. female.  HPI    Star Age, MD, PhD [facility] [address], [address] [place], [place] [address]  Dear Mamie Nick,   I saw your patient, Danielle Kelley, upon your kind request, in my Sleep clinic today for initial consultation of her sleep disorder, in particular, concern for underlying obstructive sleep apnea.  The patient is unaccompanied today.  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  I reviewed your office note from 09/17/2021.  Her Epworth sleepiness score is 11/24, fatigue severity score is 21 out of 63.  She is working on weight loss and has lost over 30 pounds since her stroke.  Her mom had a stroke, mom lives with her and patient helps take care of her.  Patient has 3 children, oldest daughter is 34 and in college, she has another daughter age 32 and son age 25.  Patient is a non-smoker and does not utilize any alcohol or illicit drugs.  She works in [employer] for [employer].  She works from home 3 days out of the week and in the office 2 days out of the week or as needed.  She has had morning headaches, she had tried Topamax but had side effects including paresthesias and stopped the medication.  She does not have night to night nocturia.  Bedtime is generally between 9 and 9:30 PM and rise time is around 5 AM, she goes to the gym in the mornings.  She started [facility] in December.  She drinks caffeine in the form of green tea, milk daily.  She does not have a family history of sleep apnea.  Her Past Medical History Is Significant For: Past Medical History:  Diagnosis Date   Obesity    Seasonal allergies     Her Past Surgical History Is Significant  For: Past Surgical History:  Procedure Laterality Date   ABDOMINAL SURGERY  04/06/2017   Gastric sleeve   BUBBLE STUDY N/A 06/26/2021   Procedure: BUBBLE STUDY;  Surgeon: Buford Dresser, MD;  Location: [facility] ENDOSCOPY;  Service: Cardiovascular;  Laterality: N/A;   CESAREAN SECTION     2004, 2006, 2007.   TEE WITHOUT CARDIOVERSION N/A 06/26/2021   Procedure: TRANSESOPHAGEAL ECHOCARDIOGRAM (TEE);  Surgeon: Buford Dresser, MD;  Location: [facility] ENDOSCOPY;  Service: Cardiovascular;  Laterality: N/A;    Her Family History Is Significant For: Family History  Problem Relation Age of Onset   Stroke Mother    Hypertension Mother    Hypertension Maternal Grandmother    Hypertension Maternal Grandfather    Sleep apnea Neg Hx     Her Social History Is Significant For: Social History   Socioeconomic History   Marital status: Single    Spouse name: Not on file   Number of children: Not on file   Years of education: Not on file   Highest education level: Not on file  Occupational History   Not on file  Tobacco Use   Smoking status: Never   Smokeless tobacco: Never  Vaping Use   Vaping Use: Never used  Substance and Sexual Activity   Alcohol use: Not on file   Drug use: Not on file   Sexual activity: Not on file  Other  Topics Concern   Not on file  Social History Narrative   Not on file   Social Determinants of Health   Financial Resource Strain: Not on file  Food Insecurity: Not on file  Transportation Needs: Not on file  Physical Activity: Not on file  Stress: Not on file  Social Connections: Not on file    Her Allergies Are:  Allergies  Allergen Reactions   Peanut-Containing Drug Products Itching  :   Her Current Medications Are:  Outpatient Encounter Medications as of 12/03/2021  Medication Sig   aspirin 81 MG chewable tablet Chew 1 tablet (81 mg total) by mouth daily.   atorvastatin (LIPITOR) 40 MG tablet Take 1 tablet (40 mg total) by mouth daily.    cyanocobalamin 1000 MCG tablet Take 1 tablet (1,000 mcg total) by mouth daily.   ferrous sulfate 325 (65 FE) MG tablet Take 1 tablet (325 mg total) by mouth daily with breakfast.   MOUNJARO 5 MG/0.5ML Pen Inject 5 mg into the skin once a week.   Multiple Vitamin (MULTIVITAMIN WITH MINERALS) TABS tablet Take 1 tablet by mouth daily after supper.   topiramate (TOPAMAX) 50 MG tablet Take 1 tablet (50 mg total) by mouth 2 (two) times daily. (Patient not taking: Reported on 12/03/2021)   No facility-administered encounter medications on file as of 12/03/2021.  :   Review of Systems:  Out of a complete 14 point review of systems, all are reviewed and negative with the exception of these symptoms as listed below:  Review of Systems  Neurological:        Pt is here for sleep consult . Pt states she had a stroke August 2022 . Pt states she has headaches and fatigue throughout the day. Pt denies sleep study ,CPAP at home ,hypertension and snoring   ESS :11 FSS :21   Objective:  Neurological Exam  Physical Exam Physical Examination:   Vitals:   12/03/21 1351  BP: 132/86  Pulse: 66    General Examination: The patient is a very pleasant 41 y.o. female in no acute distress. She appears well-developed and well-nourished and well groomed.   HEENT: Normocephalic, atraumatic, pupils are equal, round and reactive to light, extraocular tracking is good without limitation to gaze excursion or nystagmus noted.  Corrective eyeglasses in place.  Hearing is grossly intact. Face is symmetric with normal facial animation. Speech is clear with no dysarthria noted. There is no hypophonia. There is no lip, neck/head, jaw or voice tremor. Neck is supple with full range of passive and active motion. There are no carotid bruits on auscultation. Oropharynx exam reveals: mild mouth dryness, good dental hygiene and mild airway crowding, due to small airway entry, tonsils 1+ bilaterally, Mallampati class II.  Neck  circumference of 14-7/8 inches.  Minimal overbite.  Tongue protrudes centrally and palate elevates symmetrically.  Chest: Clear to auscultation without wheezing, rhonchi or crackles noted.  Heart: S1+S2+0, regular and normal without murmurs, rubs or gallops noted.   Abdomen: Soft, non-tender and non-distended.  Extremities: There is no obvious edema in the distal lower extremities bilaterally.   Skin: Warm and dry without trophic changes noted.   Musculoskeletal: exam reveals no obvious joint deformities.   Neurologically:  Mental status: The patient is awake, alert and oriented in all 4 spheres. Her immediate and remote memory, attention, language skills and fund of knowledge are appropriate. There is no evidence of aphasia, agnosia, apraxia or anomia. Speech is clear with normal prosody and enunciation. Thought  process is linear. Mood is normal and affect is normal.  Cranial nerves II - XII are as described above under HEENT exam.  Motor exam: Normal bulk, strength and tone is noted on the right. There is no tremor. Fine motor skills and coordination: grossly intact gait, mild difficulty on the left.  Cerebellar testing: No dysmetria or intention tremor. There is no truncal or gait ataxia.  Sensory exam: intact to light touch in the upper and lower extremities, she reports intermittent numbness in the left leg.  Gait, station and balance: She stands slowly without difficulty.  She walks with a slight limp on the left.  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 I had a long chat with the patient about my findings and the diagnosis of OSA, its prognosis and treatment options. We talked about  medical treatments, surgical interventions and non-pharmacological approaches. I explained in particular the risks and ramifications of untreated moderate to severe OSA, especially with respect to developing cardiovascular disease down the Road, including congestive heart failure, difficult to treat hypertension, cardiac arrhythmias, or stroke. Even type 2 diabetes has, in part, been linked to untreated OSA. Symptoms of untreated OSA include daytime sleepiness, memory problems, mood irritability and mood disorder such as depression and anxiety, lack of energy, as well as recurrent headaches, especially morning headaches. We talked about trying to maintain a healthy lifestyle in general, as well as the importance of weight control. We also talked about the importance of good sleep hygiene.  She is commended on her weight loss success and lifestyle modifications. I recommended the following at this time: sleep study.  I outlined the differences between a laboratory attended sleep study versus home sleep test. I explained the sleep test procedure to the patient and also outlined possible surgical and non-surgical treatment options of OSA, including the use of a custom-made dental device (which would require a referral to a specialist dentist or oral surgeon), upper airway surgical options, such as traditional UPPP or a novel less invasive surgical option in the form of Inspire hypoglossal nerve stimulation (which would involve a referral to an ENT surgeon). I also explained the CPAP treatment option to the patient, who indicated that she would be willing to try CPAP if the need arises. I explained the importance of being compliant with PAP treatment, not only for insurance purposes but primarily to improve Her symptoms, and for the patient's long term health benefit, including to reduce Her cardiovascular risks. I answered all her questions today and the patient was in agreement. I plan to see her back after the  sleep study is completed and encouraged her to call with any interim questions, concerns, problems or updates.   Thank you very much for allowing me to participate in the care of this nice patient. If I can be of any further assistance to you please do not hesitate to talk to me.  Sincerely,   Star Age, MD, PhD

## 2021-12-18 ENCOUNTER — Ambulatory Visit: Payer: BC Managed Care – PPO | Admitting: Adult Health

## 2021-12-25 ENCOUNTER — Ambulatory Visit (INDEPENDENT_AMBULATORY_CARE_PROVIDER_SITE_OTHER): Payer: BC Managed Care – PPO | Admitting: Neurology

## 2021-12-25 DIAGNOSIS — G4733 Obstructive sleep apnea (adult) (pediatric): Secondary | ICD-10-CM

## 2021-12-25 DIAGNOSIS — G4719 Other hypersomnia: Secondary | ICD-10-CM

## 2021-12-25 DIAGNOSIS — R519 Headache, unspecified: Secondary | ICD-10-CM

## 2021-12-25 DIAGNOSIS — I6381 Other cerebral infarction due to occlusion or stenosis of small artery: Secondary | ICD-10-CM

## 2021-12-25 DIAGNOSIS — Z823 Family history of stroke: Secondary | ICD-10-CM

## 2021-12-25 DIAGNOSIS — R0683 Snoring: Secondary | ICD-10-CM

## 2021-12-25 DIAGNOSIS — R634 Abnormal weight loss: Secondary | ICD-10-CM

## 2021-12-30 NOTE — Procedures (Signed)
°  ° °  [facility]  HOME SLEEP TEST (Watch PAT) REPORT  STUDY DATE: 12/25/2021  DOB: 1981/10/26  MRN: [id number]  ORDERING CLINICIAN: Star Age, MD, PhD   REFERRING CLINICIAN: Dr. 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   Epworth sleepiness score: 11/24.  BMI: 40.4 kg/m  FINDINGS:   Sleep Summary:   Total Recording Time (hours, min): 6 hours, 34 minutes  Total Sleep Time (hours, min):  5 hours, 43 minutes   Percent REM (%):    6.3%   Respiratory Indices:   Calculated pAHI (per hour):  1.6/hour         REM pAHI:    n/a       NREM pAHI: 1.6/hour  Oxygen Saturation Statistics:    Oxygen Saturation (%) Mean: 95%   Minimum oxygen saturation (%):                 92%   O2 Saturation Range (%): 92-98%    O2 Saturation (minutes) <=88%: 0 min  Pulse Rate Statistics:   Pulse Mean (bpm):    78/min    Pulse Range (67-101/min)   IMPRESSION: Primary snoring   RECOMMENDATION:  This home sleep test does not demonstrate any significant obstructive or central sleep disordered breathing. Some snoring was noted, and appeared to be intermittent, mild to moderate. Other causes of the patient's symptoms, including circadian rhythm disturbances, an underlying mood disorder, medication effect and/or an underlying medical problem cannot be ruled out based on this test. Clinical correlation is recommended. The patient should be cautioned not to drive, work at heights, or operate dangerous or heavy equipment when tired or sleepy. Review and reiteration of good sleep hygiene measures should be pursued with any patient. The patient can follow up with her referring provider, who will be notified of the test  results.   I certify that I have reviewed the raw data recording prior to the issuance of this report in accordance with the standards of the American Academy of Sleep Medicine (AASM).   INTERPRETING PHYSICIAN:   Star Age, MD, PhD  Board Certified in Neurology and Sleep Medicine  [facility] [address], [place], [place] [address] [phone]

## 2021-12-30 NOTE — Progress Notes (Signed)
°  ° °  [facility]  HOME SLEEP TEST (Watch PAT) REPORT  STUDY DATE: 12/25/2021  DOB: October 07, 1981  MRN: [id number]  ORDERING CLINICIAN: Star Age, MD, PhD   REFERRING CLINICIAN: Dr. 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   Epworth sleepiness score: 11/24.  BMI: 40.4 kg/m  FINDINGS:   Sleep Summary:   Total Recording Time (hours, min): 6 hours, 34 minutes  Total Sleep Time (hours, min):  5 hours, 43 minutes   Percent REM (%):    6.3%   Respiratory Indices:   Calculated pAHI (per hour):  1.6/hour         REM pAHI:    n/a       NREM pAHI: 1.6/hour  Oxygen Saturation Statistics:    Oxygen Saturation (%) Mean: 95%   Minimum oxygen saturation (%):                 92%   O2 Saturation Range (%): 92-98%    O2 Saturation (minutes) <=88%: 0 min  Pulse Rate Statistics:   Pulse Mean (bpm):    78/min    Pulse Range (67-101/min)   IMPRESSION: Primary snoring   RECOMMENDATION:  This home sleep test does not demonstrate any significant obstructive or central sleep disordered breathing. Some snoring was noted, and appeared to be intermittent, mild to moderate. Other causes of the patient's symptoms, including circadian rhythm disturbances, an underlying mood disorder, medication effect and/or an underlying medical problem cannot be ruled out based on this test. Clinical correlation is recommended. The patient should be cautioned not to drive, work at heights, or operate dangerous or heavy equipment when tired or sleepy. Review and reiteration of good sleep hygiene measures should be pursued with any patient. The patient can follow up with her referring provider, who will be notified of the test  results.   I certify that I have reviewed the raw data recording prior to the issuance of this report in accordance with the standards of the American Academy of Sleep Medicine (AASM).   INTERPRETING PHYSICIAN:   Star Age, MD, PhD  Board Certified in Neurology and Sleep Medicine  [facility] [address] [place], [place] [address] [phone]

## 2021-12-31 ENCOUNTER — Ambulatory Visit: Payer: BC Managed Care – PPO | Attending: Physical Medicine and Rehabilitation | Admitting: Speech Pathology

## 2021-12-31 ENCOUNTER — Encounter: Payer: Self-pay | Admitting: Speech Pathology

## 2021-12-31 ENCOUNTER — Other Ambulatory Visit: Payer: Self-pay

## 2021-12-31 ENCOUNTER — Telehealth: Payer: Self-pay

## 2021-12-31 DIAGNOSIS — R41841 Cognitive communication deficit: Secondary | ICD-10-CM | POA: Diagnosis not present

## 2021-12-31 NOTE — Telephone Encounter (Signed)
-----   Message from Huston Foley, MD sent at 12/30/2021  6:18 PM EST ----- Patient referred by Dr. Pearlean Brownie, seen by me on 12/03/2021, HST on 12/25/2021.   Please call and notify the patient that the recent home sleep test did not show any significant obstructive sleep apnea.  Treatment with a CPAP or AutoPap machine is not indicated.  She had intermittent snoring which was in the mild to moderate range, weight loss and avoiding the back sleep position may alleviate her snoring.  She can follow-up with Dr. Pearlean Brownie as scheduled. Thanks,  Huston Foley, MD, PhD [facility])

## 2021-12-31 NOTE — Therapy (Signed)
[facility] [address], [place]. [place], [place], [address] Phone: [phone]   Fax:  [phone]  Speech Language Pathology Evaluation  Patient Details  Name: Danielle Kelley MRN: [id number] Date of Birth: 21-Nov-1980 Referring Provider (SLP): Danielle Earing MD   Encounter Date: 12/31/2021   End of Session - 12/31/21 1713     Visit Number 1    Number of Visits 17    Date for SLP Re-Evaluation 03/30/22    SLP Start Time 1400    SLP Stop Time  1450    SLP Time Calculation (min) 50 min    Activity Tolerance Patient tolerated treatment well             Past Medical History:  Diagnosis Date   Obesity    Seasonal allergies     Past Surgical History:  Procedure Laterality Date   ABDOMINAL SURGERY  04/06/2017   Gastric sleeve   BUBBLE STUDY N/A 06/26/2021   Procedure: BUBBLE STUDY;  Surgeon: Danielle Red, MD;  Location: [facility] ENDOSCOPY;  Service: Cardiovascular;  Laterality: N/A;   CESAREAN SECTION     2004, 2006, 2007.   TEE WITHOUT CARDIOVERSION N/A 06/26/2021   Procedure: TRANSESOPHAGEAL ECHOCARDIOGRAM (TEE);  Surgeon: Danielle Red, MD;  Location: [facility] ENDOSCOPY;  Service: Cardiovascular;  Laterality: N/A;    There were no vitals filed for this visit.   Subjective Assessment - 12/31/21 1411     Subjective Pt was pleasant and cooperative throughout assessment.    Currently in Pain? No/denies                SLP Evaluation [facility] - 12/31/21 1411       SLP Visit Information   SLP Received On 12/31/21    Referring Provider (SLP) Danielle Earing MD    Onset Date 06/28/22    Medical Diagnosis CVA      Subjective   Patient/Family Stated Goal "To feel more like myself"      General Information   HPI Danielle Kelley is a 41 y.o. female with history of morbid obesity s/p gastric sleeve otherwise in relatively good health who was admitted on 06/24/2021 with reports of left-sided numbness with  weakness, heaviness and difficulty walking.  MRI brain done revealing small acute infarct in right posterior frontal corona radiata      Balance Screen   Has the patient fallen in the past 6 months Yes    How many times? 1    Has the patient had a decrease in activity level because of a fear of falling?  No    Is the patient reluctant to leave their home because of a fear of falling?  No      Prior Functional Status   Cognitive/Linguistic Baseline Within functional limits    Type of Home House     Lives With Family    Available Support Family    Education BS    Vocation Full time employment   HR     Cognition   Overall Cognitive Status Impaired/Different from baseline      Auditory Comprehension   Overall Auditory Comprehension Appears within functional limits for tasks assessed      Verbal Expression   Overall Verbal Expression Appears within functional limits for tasks assessed      Standardized Assessments   Standardized Assessments  Cognitive Linguistic Quick Test      Cognitive Linguistic Quick Test (Ages 18-69)   Attention WNL    Memory  Mild    Executive Function WNL    Language WNL    Visuospatial Skills WNL    Severity Rating Total 19    Composite Severity Rating 15.8                             SLP Education - 12/31/21 1713     Education Details Cognitive-communicaton impairment    Person(s) Educated Patient    Methods Explanation;Demonstration    Comprehension Verbalized understanding;Need further instruction              SLP Short Term Goals - 12/31/21 1719       SLP SHORT TERM GOAL #1   Title Pt will increase auditory comprehension by recalling and verbalizing strategies to assist with active listening during conversation with minA.    Time 4    Period Weeks    Status New    Target Date 01/28/22      SLP SHORT TERM GOAL #2   Title Pt will comprehend functional memory or visual aids for recall of important information during  structured conversations with minA.    Time 4    Period Weeks    Status New    Target Date 01/28/22      SLP SHORT TERM GOAL #3   Title Pt will recall word finding strategies to use during instances of anomia in conversation with miNA.    Time 4    Period Weeks    Status New    Target Date 01/28/22              SLP Long Term Goals - 12/31/21 1721       SLP LONG TERM GOAL #1   Title Pt will report successful use of strategies to assist with active listening during conversation independently.    Time 8    Period Weeks    Status New    Target Date 02/25/22      SLP LONG TERM GOAL #2   Title Pt will report successful use of functional memory or visual aids for recall of important information during  conversations.    Time 8    Period Weeks    Status New    Target Date 02/25/22      SLP LONG TERM GOAL #3   Title Pt will demonstrate use of word finding strategies to use during instances of anomia in conversation with miNA.    Time 8    Period Weeks    Status New    Target Date 02/25/22              Plan - 12/31/21 1713     Clinical Impression Statement Pt is a 41 yo female who presents to OP ST for evaluation post CVA in August. Pt reports she "doesn't feel like herself" and has trouble expressing information during conversations. She also endorses difficulty re: answering questions quickly, confusing words, slower processing, difficulty paying attention, and short term memory. Pt reports she was originally told these deficits would improve, but reports "they aren't getting any better". SLP assessed pt using CLQT. Pt exhibited mild deficits in memory and WNL for all other areas. SLP explained to pt that assessments are not always sensitive enough to detect deficits and that we will complete a PROMS next session to fully encompass what she is feeling regarding her areas of problem. Though WNL on the assessment, SLP observed difficulty with attention and executive  functioning. Pt was not always able recognize errors made and required prompting to double-check work. SLP rec skilled ST services to address active listening skills to increase attention during conversation as well as memory strategies and word finding strategies to assist in better communication.    Speech Therapy Frequency 2x / week    Duration 8 weeks    Treatment/Interventions Cueing hierarchy;Functional tasks;Patient/family education;Compensatory strategies;Environmental controls;Multimodal communcation approach;Language facilitation;Compensatory techniques;Internal/external aids;SLP instruction and feedback    Potential to Achieve Goals Good    Consulted and Agree with Plan of Care Patient             Patient will benefit from skilled therapeutic intervention in order to improve the following deficits and impairments:   Cognitive communication deficit    Problem List Patient Active Problem List   Diagnosis Date Noted   Vitamin B 12 deficiency 07/03/2021   Morbid obesity (HCC) 07/03/2021   New onset of headaches 07/03/2021   Stroke (cerebrum) (HCC) 06/28/2021   Iron deficiency anemia    Seasonal allergies    Acute CVA (cerebrovascular accident) (HCC) 06/25/2021   Cerebrovascular accident (CVA) (HCC) 06/24/2021    Dorena Bodo, CCC-SLP 12/31/2021, 5:23 PM  [facility] [address] [place], [place], [address] Phone: [phone]   Fax:  [phone]  Name: Danielle Kelley MRN: [id number] Date of Birth: 12-Feb-1981

## 2021-12-31 NOTE — Telephone Encounter (Signed)
I called patient. I discussed her sleep study results and recommendations. She will follow up with Dr. Pearlean Brownie as recommended. Pt verbalized understanding of results. Pt had no questions at this time but was encouraged to call back if questions arise.

## 2022-01-01 ENCOUNTER — Encounter: Payer: Self-pay | Admitting: Speech Pathology

## 2022-01-01 ENCOUNTER — Ambulatory Visit: Payer: BC Managed Care – PPO | Attending: Physical Medicine and Rehabilitation | Admitting: Speech Pathology

## 2022-01-01 DIAGNOSIS — R41841 Cognitive communication deficit: Secondary | ICD-10-CM | POA: Diagnosis present

## 2022-01-02 ENCOUNTER — Encounter: Payer: Self-pay | Admitting: Speech Pathology

## 2022-01-02 NOTE — Therapy (Signed)
[place] ?[facility] ?[address] ?[place], [place], [address] ?Phone: [phone]   Fax:  [phone] ? ?Speech Language Pathology Treatment ? ?Patient Details  ?Name: Danielle Kelley ?MRN: [id number] ?Date of Birth: 09-Dec-1980 ?Referring Provider (SLP): Gabriel Earing MD ? ? ?Encounter Date: 01/01/2022 ? ? End of Session - 01/01/22 1106   ? ? Visit Number 2   ? Number of Visits 17   ? Date for SLP Re-Evaluation 03/30/22   ? SLP Start Time 1102   ? SLP Stop Time  1142   ? SLP Time Calculation (min) 40 min   ? Activity Tolerance Patient tolerated treatment well   ? ?  ?  ? ?  ? ? ?Past Medical History:  ?Diagnosis Date  ? Obesity   ? Seasonal allergies   ? ? ?Past Surgical History:  ?Procedure Laterality Date  ? ABDOMINAL SURGERY  04/06/2017  ? Gastric sleeve  ? BUBBLE STUDY N/A 06/26/2021  ? Procedure: BUBBLE STUDY;  Surgeon: Jodelle Red, MD;  Location: [facility] ENDOSCOPY;  Service: Cardiovascular;  Laterality: N/A;  ? CESAREAN SECTION    ? 2004, 2006, 2007.  ? TEE WITHOUT CARDIOVERSION N/A 06/26/2021  ? Procedure: TRANSESOPHAGEAL ECHOCARDIOGRAM (TEE);  Surgeon: Jodelle Red, MD;  Location: [facility] ENDOSCOPY;  Service: Cardiovascular;  Laterality: N/A;  ? ? ?There were no vitals filed for this visit. ? ? Subjective Assessment - 01/01/22 1105   ? ? Subjective "Good.."   ? Currently in Pain? No/denies   ? ?  ?  ? ?  ? ? ? ? ? ? ? ? ADULT SLP TREATMENT - 01/02/22 0001   ? ?  ? General Information  ? Behavior/Cognition Alert;Cooperative   ?  ? Treatment Provided  ? Treatment provided Cognitive-Linquistic   ?  ? Cognitive-Linquistic Treatment  ? Treatment focused on Cognition;Patient/family/caregiver education   ? Skilled Treatment Edu on cognitive-communication impariemnt and provided handout. Pt became emotional during today's session about how her stroke has impacted her. Began training on attention strategies. Pt benefited from repetition and slowed rate when  providing information. To cont attention strategies next session to increase comprehension and understanding during conversation. Pt reported she has difficulty with re: interviewing, restaurants, transitioning during cleaning tasks. Pt to try using a sign on the door so she is able to close the door without feeling unapproachable.   ?  ? Assessment / Recommendations / Plan  ? Plan Continue with current plan of care   ?  ? Progression Toward Goals  ? Progression toward goals Progressing toward goals   ? ?  ?  ? ?  ? ? ? ? ? SLP Short Term Goals - 12/31/21 1719   ? ?  ? SLP SHORT TERM GOAL #1  ? Title Pt will increase auditory comprehension by recalling and verbalizing strategies to assist with active listening during conversation with minA.   ? Time 4   ? Period Weeks   ? Status New   ? Target Date 01/28/22   ?  ? SLP SHORT TERM GOAL #2  ? Title Pt will comprehend functional memory or visual aids for recall of important information during structured conversations with minA.   ? Time 4   ? Period Weeks   ? Status New   ? Target Date 01/28/22   ?  ? SLP SHORT TERM GOAL #3  ? Title Pt will recall word finding strategies to use during instances of anomia in conversation with miNA.   ?  Time 4   ? Period Weeks   ? Status New   ? Target Date 01/28/22   ? ?  ?  ? ?  ? ? ? SLP Long Term Goals - 12/31/21 1721   ? ?  ? SLP LONG TERM GOAL #1  ? Title Pt will report successful use of strategies to assist with active listening during conversation independently.   ? Time 8   ? Period Weeks   ? Status New   ? Target Date 02/25/22   ?  ? SLP LONG TERM GOAL #2  ? Title Pt will report successful use of functional memory or visual aids for recall of important information during  conversations.   ? Time 8   ? Period Weeks   ? Status New   ? Target Date 02/25/22   ?  ? SLP LONG TERM GOAL #3  ? Title Pt will demonstrate use of word finding strategies to use during instances of anomia in conversation with miNA.   ? Time 8   ? Period Weeks    ? Status New   ? Target Date 02/25/22   ? ?  ?  ? ?  ? ? ? Plan - 01/02/22 1512   ? ? Clinical Impression Statement See tx note. SLP rec skilled ST services to address active listening skills to increase attention during conversation as well as memory strategies and word finding strategies to assist in better communication. Cont with current POC.   ? Speech Therapy Frequency 2x / week   ? Duration 8 weeks   ? Treatment/Interventions Cueing hierarchy;Functional tasks;Patient/family education;Compensatory strategies;Environmental controls;Multimodal communcation approach;Language facilitation;Compensatory techniques;Internal/external aids;SLP instruction and feedback   ? Potential to Achieve Goals Good   ? Consulted and Agree with Plan of Care Patient   ? ?  ?  ? ?  ? ? ?Patient will benefit from skilled therapeutic intervention in order to improve the following deficits and impairments:   ?Cognitive communication deficit ? ? ? ?Problem List ?Patient Active Problem List  ? Diagnosis Date Noted  ? Vitamin B 12 deficiency 07/03/2021  ? Morbid obesity (HCC) 07/03/2021  ? New onset of headaches 07/03/2021  ? 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 Iron deficiency anemia   ? Seasonal allergies   ? Acute CVA (cerebrovascular accident) (HCC) 06/25/2021  ? Cerebrovascular accident (CVA) (HCC) 06/24/2021  ? ? ?Dorena Bodo, CCC-SLP ?01/02/2022, 3:12 PM ? ?[place] ?[facility] ?[address] ?[place], [place], [address] ?Phone: [phone]   Fax:  [phone] ? ? ?Name: Felisha Claytor ?MRN: [id number] ?Date of Birth: 10/22/1981 ? ?

## 2022-01-07 ENCOUNTER — Ambulatory Visit: Payer: BC Managed Care – PPO | Admitting: Speech Pathology

## 2022-01-07 ENCOUNTER — Other Ambulatory Visit: Payer: Self-pay

## 2022-01-07 DIAGNOSIS — R41841 Cognitive communication deficit: Secondary | ICD-10-CM | POA: Diagnosis not present

## 2022-01-08 NOTE — Therapy (Signed)
[place] ?[facility] ?[address] ?[place], [place], [address] ?Phone: [phone]   Fax:  [phone] ? ?Speech Language Pathology Treatment ? ?Patient Details  ?Name: Danielle Kelley ?MRN: [id number] ?Date of Birth: 07/26/1981 ?Referring Provider (SLP): Gabriel Earing MD ? ? ?Encounter Date: 01/07/2022 ? ? End of Session - 01/07/22 1106   ? ? Visit Number 3   ? Number of Visits 17   ? Date for SLP Re-Evaluation 03/30/22   ? SLP Start Time 1104   ? SLP Stop Time  1144   ? SLP Time Calculation (min) 40 min   ? Activity Tolerance Patient tolerated treatment well   ? ?  ?  ? ?  ? ? ?Past Medical History:  ?Diagnosis Date  ? Obesity   ? Seasonal allergies   ? ? ?Past Surgical History:  ?Procedure Laterality Date  ? ABDOMINAL SURGERY  04/06/2017  ? Gastric sleeve  ? BUBBLE STUDY N/A 06/26/2021  ? Procedure: BUBBLE STUDY;  Surgeon: Jodelle Red, MD;  Location: [facility] ENDOSCOPY;  Service: Cardiovascular;  Laterality: N/A;  ? CESAREAN SECTION    ? 2004, 2006, 2007.  ? TEE WITHOUT CARDIOVERSION N/A 06/26/2021  ? Procedure: TRANSESOPHAGEAL ECHOCARDIOGRAM (TEE);  Surgeon: Jodelle Red, MD;  Location: [facility] ENDOSCOPY;  Service: Cardiovascular;  Laterality: N/A;  ? ? ?There were no vitals filed for this visit. ? ? Subjective Assessment - 01/07/22 1105   ? ? Subjective "feeling about the same"   ? Currently in Pain? No/denies   ? ?  ?  ? ?  ? ? ? ? ? ? ? ? ADULT SLP TREATMENT - 01/08/22 1616   ? ?  ? General Information  ? Behavior/Cognition Alert;Cooperative   ?  ? Treatment Provided  ? Treatment provided Cognitive-Linquistic   ?  ? Cognitive-Linquistic Treatment  ? Treatment focused on Cognition;Patient/family/caregiver education   ? Skilled Treatment Cont edu on attention strategies to aid comprehension during conversation. Pt was able to provide examples and demonstrate understanding of strategies. Edu on the [employer] and the impact fatigue can have on attention  skills. Pt reported she is inciting change in her office by using a door sign to indicate that someone is available, in the office, out of the office etc. Pt reported she could benefit from taking brain breaks throughout the day. To cont next session.   ?  ? Assessment / Recommendations / Plan  ? Plan Continue with current plan of care   ?  ? Progression Toward Goals  ? Progression toward goals Progressing toward goals   ? ?  ?  ? ?  ? ? ? ? ? SLP Short Term Goals - 12/31/21 1719   ? ?  ? SLP SHORT TERM GOAL #1  ? Title Pt will increase auditory comprehension by recalling and verbalizing strategies to assist with active listening during conversation with minA.   ? Time 4   ? Period Weeks   ? Status New   ? Target Date 01/28/22   ?  ? SLP SHORT TERM GOAL #2  ? Title Pt will comprehend functional memory or visual aids for recall of important information during structured conversations with minA.   ? Time 4   ? Period Weeks   ? Status New   ? Target Date 01/28/22   ?  ? SLP SHORT TERM GOAL #3  ? Title Pt will recall word finding strategies to use during instances of anomia in conversation with miNA.   ?  Time 4   ? Period Weeks   ? Status New   ? Target Date 01/28/22   ? ?  ?  ? ?  ? ? ? SLP Long Term Goals - 12/31/21 1721   ? ?  ? SLP LONG TERM GOAL #1  ? Title Pt will report successful use of strategies to assist with active listening during conversation independently.   ? Time 8   ? Period Weeks   ? Status New   ? Target Date 02/25/22   ?  ? SLP LONG TERM GOAL #2  ? Title Pt will report successful use of functional memory or visual aids for recall of important information during  conversations.   ? Time 8   ? Period Weeks   ? Status New   ? Target Date 02/25/22   ?  ? SLP LONG TERM GOAL #3  ? Title Pt will demonstrate use of word finding strategies to use during instances of anomia in conversation with miNA.   ? Time 8   ? Period Weeks   ? Status New   ? Target Date 02/25/22   ? ?  ?  ? ?  ? ? ? Plan - 01/07/22 1107    ? ? Clinical Impression Statement See tx note. SLP rec skilled ST services to address active listening skills to increase attention during conversation as well as memory strategies and word finding strategies to assist in better communication. Cont with current POC.   ? Speech Therapy Frequency 2x / week   ? Duration 8 weeks   ? Treatment/Interventions Cueing hierarchy;Functional tasks;Patient/family education;Compensatory strategies;Environmental controls;Multimodal communcation approach;Language facilitation;Compensatory techniques;Internal/external aids;SLP instruction and feedback   ? Potential to Achieve Goals Good   ? Consulted and Agree with Plan of Care Patient   ? ?  ?  ? ?  ? ? ?Patient will benefit from skilled therapeutic intervention in order to improve the following deficits and impairments:   ?Cognitive communication deficit ? ? ? ?Problem List ?Patient Active Problem List  ? Diagnosis Date Noted  ? Vitamin B 12 deficiency 07/03/2021  ? Morbid obesity (HCC) 07/03/2021  ? New onset of headaches 07/03/2021  ? 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 Iron deficiency anemia   ? Seasonal allergies   ? Acute CVA (cerebrovascular accident) (HCC) 06/25/2021  ? Cerebrovascular accident (CVA) (HCC) 06/24/2021  ? ? ?Dorena Bodo, CCC-SLP ?01/08/2022, 4:19 PM ? ?[place] ?[facility] ?[address] ?[place], [place], [address] ?Phone: [phone]   Fax:  [phone] ? ? ?Name: Danielle Kelley ?MRN: [id number] ?Date of Birth: 03/21/1981 ? ?

## 2022-01-10 ENCOUNTER — Ambulatory Visit: Payer: BC Managed Care – PPO | Admitting: Speech Pathology

## 2022-01-14 ENCOUNTER — Ambulatory Visit: Payer: BC Managed Care – PPO | Admitting: Speech Pathology

## 2022-01-21 ENCOUNTER — Ambulatory Visit: Payer: BC Managed Care – PPO | Admitting: Speech Pathology

## 2022-01-24 ENCOUNTER — Ambulatory Visit: Payer: BC Managed Care – PPO | Admitting: Speech Pathology

## 2022-02-03 ENCOUNTER — Institutional Professional Consult (permissible substitution): Payer: BC Managed Care – PPO | Admitting: Neurology

## 2022-06-10 ENCOUNTER — Encounter
Payer: BC Managed Care – PPO | Attending: Physical Medicine and Rehabilitation | Admitting: Physical Medicine and Rehabilitation

## 2022-12-17 ENCOUNTER — Encounter (HOSPITAL_COMMUNITY): Payer: Self-pay

## 2022-12-17 ENCOUNTER — Emergency Department (HOSPITAL_COMMUNITY): Payer: BC Managed Care – PPO

## 2022-12-17 ENCOUNTER — Inpatient Hospital Stay (HOSPITAL_COMMUNITY)
Admission: EM | Admit: 2022-12-17 | Discharge: 2022-12-19 | DRG: 062 | Disposition: A | Discharge status: Home / Self Care | Payer: BC Managed Care – PPO | Attending: Neurology | Admitting: Neurology

## 2022-12-17 ENCOUNTER — Inpatient Hospital Stay (HOSPITAL_COMMUNITY): Payer: BC Managed Care – PPO

## 2022-12-17 DIAGNOSIS — R29703 NIHSS score 3: Secondary | ICD-10-CM | POA: Diagnosis present

## 2022-12-17 DIAGNOSIS — I639 Cerebral infarction, unspecified: Secondary | ICD-10-CM | POA: Diagnosis present

## 2022-12-17 DIAGNOSIS — E538 Deficiency of other specified B group vitamins: Secondary | ICD-10-CM | POA: Diagnosis present

## 2022-12-17 DIAGNOSIS — Z823 Family history of stroke: Secondary | ICD-10-CM | POA: Diagnosis not present

## 2022-12-17 DIAGNOSIS — I10 Essential (primary) hypertension: Secondary | ICD-10-CM | POA: Diagnosis present

## 2022-12-17 DIAGNOSIS — G8314 Monoplegia of lower limb affecting left nondominant side: Secondary | ICD-10-CM | POA: Diagnosis present

## 2022-12-17 DIAGNOSIS — I6389 Other cerebral infarction: Secondary | ICD-10-CM

## 2022-12-17 DIAGNOSIS — Z8673 Personal history of transient ischemic attack (TIA), and cerebral infarction without residual deficits: Secondary | ICD-10-CM | POA: Diagnosis not present

## 2022-12-17 DIAGNOSIS — R4701 Aphasia: Secondary | ICD-10-CM | POA: Diagnosis present

## 2022-12-17 DIAGNOSIS — Z91010 Allergy to peanuts: Secondary | ICD-10-CM

## 2022-12-17 DIAGNOSIS — G43109 Migraine with aura, not intractable, without status migrainosus: Secondary | ICD-10-CM | POA: Diagnosis present

## 2022-12-17 DIAGNOSIS — Z8249 Family history of ischemic heart disease and other diseases of the circulatory system: Secondary | ICD-10-CM

## 2022-12-17 DIAGNOSIS — Z6841 Body Mass Index (BMI) 40.0 and over, adult: Secondary | ICD-10-CM | POA: Diagnosis not present

## 2022-12-17 DIAGNOSIS — Z7982 Long term (current) use of aspirin: Secondary | ICD-10-CM | POA: Diagnosis not present

## 2022-12-17 DIAGNOSIS — E785 Hyperlipidemia, unspecified: Secondary | ICD-10-CM | POA: Diagnosis present

## 2022-12-17 DIAGNOSIS — R29810 Facial weakness: Secondary | ICD-10-CM | POA: Diagnosis present

## 2022-12-17 DIAGNOSIS — I6300 Cerebral infarction due to thrombosis of unspecified precerebral artery: Secondary | ICD-10-CM | POA: Diagnosis not present

## 2022-12-17 DIAGNOSIS — Z79899 Other long term (current) drug therapy: Secondary | ICD-10-CM

## 2022-12-17 DIAGNOSIS — R531 Weakness: Principal | ICD-10-CM

## 2022-12-17 DIAGNOSIS — J302 Other seasonal allergic rhinitis: Secondary | ICD-10-CM | POA: Diagnosis present

## 2022-12-17 HISTORY — DX: Cerebral infarction, unspecified: I63.9

## 2022-12-17 LAB — COMPREHENSIVE METABOLIC PANEL
ALT: 17 U/L (ref 0–44)
AST: 22 U/L (ref 15–41)
Albumin: 3.6 g/dL (ref 3.5–5.0)
Alkaline Phosphatase: 59 U/L (ref 38–126)
Anion gap: 8 (ref 5–15)
BUN: 6 mg/dL (ref 6–20)
CO2: 26 mmol/L (ref 22–32)
Calcium: 9.2 mg/dL (ref 8.9–10.3)
Chloride: 103 mmol/L (ref 98–111)
Creatinine, Ser: 0.8 mg/dL (ref 0.44–1.00)
GFR, Estimated: >60 mL/min (ref >60)
Glucose, Bld: 80 mg/dL (ref 70–99)
Potassium: 4 mmol/L (ref 3.5–5.1)
Sodium: 137 mmol/L (ref 135–145)
Total Bilirubin: 0.5 mg/dL (ref 0.3–1.2)
Total Protein: 7.4 g/dL (ref 6.5–8.1)

## 2022-12-17 LAB — CBC WITH DIFFERENTIAL/PLATELET
Abs Immature Granulocytes: 0.02 10*3/uL (ref 0.00–0.07)
Basophils Absolute: 0 10*3/uL (ref 0.0–0.1)
Basophils Relative: 0 %
Eosinophils Absolute: 0.1 10*3/uL (ref 0.0–0.5)
Eosinophils Relative: 1 %
HCT: 37.2 % (ref 36.0–46.0)
Hemoglobin: 12.5 g/dL (ref 12.0–15.0)
Immature Granulocytes: 0 %
Lymphocytes Relative: 36 %
Lymphs Abs: 2.2 10*3/uL (ref 0.7–4.0)
MCH: 28 pg (ref 26.0–34.0)
MCHC: 33.6 g/dL (ref 30.0–36.0)
MCV: 83.4 fL (ref 80.0–100.0)
Monocytes Absolute: 0.4 10*3/uL (ref 0.1–1.0)
Monocytes Relative: 7 %
Neutro Abs: 3.4 10*3/uL (ref 1.7–7.7)
Neutrophils Relative %: 56 %
Platelets: 425 10*3/uL — ABNORMAL HIGH (ref 150–400)
RBC: 4.46 MIL/uL (ref 3.87–5.11)
RDW: 12.9 % (ref 11.5–15.5)
WBC: 6.1 10*3/uL (ref 4.0–10.5)
nRBC: 0 % (ref 0.0–0.2)

## 2022-12-17 LAB — ECHOCARDIOGRAM COMPLETE
AR max vel: 2.91 cm2
AV Area VTI: 2.84 cm2
AV Area mean vel: 2.71 cm2
AV Mean grad: 5 mmHg
AV Peak grad: 8.8 mmHg
Ao pk vel: 1.48 m/s
Area-P 1/2: 3.21 cm2
Height: 70 in
S' Lateral: 2.9 cm
Weight: 4472.69 oz

## 2022-12-17 LAB — I-STAT CHEM 8, ED
BUN: 8 mg/dL (ref 6–20)
Calcium, Ion: 1.22 mmol/L (ref 1.15–1.40)
Chloride: 103 mmol/L (ref 98–111)
Creatinine, Ser: 0.7 mg/dL (ref 0.44–1.00)
Glucose, Bld: 72 mg/dL (ref 70–99)
HCT: 39 % (ref 36.0–46.0)
Hemoglobin: 13.3 g/dL (ref 12.0–15.0)
Potassium: 4.1 mmol/L (ref 3.5–5.1)
Sodium: 140 mmol/L (ref 135–145)
TCO2: 27 mmol/L (ref 22–32)

## 2022-12-17 LAB — I-STAT BETA HCG BLOOD, ED (MC, WL, AP ONLY): I-stat hCG, quantitative: <5 m[IU]/mL (ref <5)

## 2022-12-17 LAB — CBG MONITORING, ED: Glucose-Capillary: 70 mg/dL (ref 70–99)

## 2022-12-17 LAB — PROTIME-INR
INR: 1 (ref 0.8–1.2)
Prothrombin Time: 13 seconds (ref 11.4–15.2)

## 2022-12-17 LAB — ETHANOL: Alcohol, Ethyl (B): <10 mg/dL (ref <10)

## 2022-12-17 LAB — HEMOGLOBIN A1C
Hgb A1c MFr Bld: 5.2 % (ref 4.8–5.6)
Mean Plasma Glucose: 102.54 mg/dL

## 2022-12-17 LAB — APTT: aPTT: 25 seconds (ref 24–36)

## 2022-12-17 LAB — HIV ANTIBODY (ROUTINE TESTING W REFLEX): HIV Screen 4th Generation wRfx: NONREACTIVE

## 2022-12-17 MED ORDER — IOHEXOL 350 MG/ML SOLN
75.0000 mL | Freq: Once | INTRAVENOUS | Status: AC | PRN
  Administered 2022-12-17: 75 mL via INTRAVENOUS

## 2022-12-17 MED ORDER — STROKE: EARLY STAGES OF RECOVERY BOOK
Freq: Once | Status: AC
  Filled 2022-12-17: qty 1

## 2022-12-17 MED ORDER — LABETALOL HCL 5 MG/ML IV SOLN: 10.0000 mg | INTRAVENOUS | Status: DC | PRN

## 2022-12-17 MED ORDER — SODIUM CHLORIDE 0.9 % IV SOLN: INTRAVENOUS | Status: DC

## 2022-12-17 MED ORDER — ACETAMINOPHEN 325 MG PO TABS
650.0000 mg | ORAL_TABLET | ORAL | Status: DC | PRN
  Administered 2022-12-17 – 2022-12-19 (×3): 650 mg via ORAL
  Filled 2022-12-17 (×3): qty 2

## 2022-12-17 MED ORDER — CLEVIDIPINE BUTYRATE 0.5 MG/ML IV EMUL: 0.0000 mg/h | INTRAVENOUS | Status: DC

## 2022-12-17 MED ORDER — ORAL CARE MOUTH RINSE: 15.0000 mL | OROMUCOSAL | Status: DC | PRN

## 2022-12-17 MED ORDER — ACETAMINOPHEN 160 MG/5ML PO SOLN: 650.0000 mg | ORAL | Status: DC | PRN

## 2022-12-17 MED ORDER — PANTOPRAZOLE SODIUM 40 MG IV SOLR
40.0000 mg | Freq: Every day | INTRAVENOUS | Status: DC
  Administered 2022-12-17: 40 mg via INTRAVENOUS
  Filled 2022-12-17: qty 10

## 2022-12-17 MED ORDER — TENECTEPLASE FOR STROKE
25.0000 mg | PACK | Freq: Once | INTRAVENOUS | Status: AC
  Administered 2022-12-17: 25 mg via INTRAVENOUS
  Filled 2022-12-17: qty 10

## 2022-12-17 MED ORDER — ACETAMINOPHEN 650 MG RE SUPP: 650.0000 mg | RECTAL | Status: DC | PRN

## 2022-12-17 MED ORDER — ATORVASTATIN CALCIUM 80 MG PO TABS
80.0000 mg | ORAL_TABLET | Freq: Every day | ORAL | Status: DC
  Administered 2022-12-17 – 2022-12-19 (×3): 80 mg via ORAL
  Filled 2022-12-17 (×3): qty 1

## 2022-12-17 MED ORDER — SODIUM CHLORIDE 0.9% FLUSH
3.0000 mL | Freq: Once | INTRAVENOUS | Status: AC
  Administered 2022-12-17: 3 mL via INTRAVENOUS

## 2022-12-17 MED ORDER — SENNOSIDES-DOCUSATE SODIUM 8.6-50 MG PO TABS: 1.0000 | ORAL_TABLET | Freq: Every evening | ORAL | Status: DC | PRN

## 2022-12-17 MED ORDER — LABETALOL HCL 5 MG/ML IV SOLN: 20.0000 mg | Freq: Once | INTRAVENOUS | Status: DC

## 2022-12-17 MED ORDER — CHLORHEXIDINE GLUCONATE CLOTH 2 % EX PADS
6.0000 | MEDICATED_PAD | Freq: Every day | CUTANEOUS | Status: DC
  Administered 2022-12-18: 6 via TOPICAL

## 2022-12-17 NOTE — Code Documentation (Addendum)
Stroke Response Nurse Documentation Code Documentation  Danielle Kelley is a 42 y.o. female arriving to [facility]  via [employer] on 12/17/2022 with past medical hx of stroke and obesity. On aspirin 81 mg daily. Code stroke was activated by ED.   Patient from home where she was LKW at 0930 and now complaining of difficulty getting into car. Left sided weakness and aphasia reported by EMS.  Stroke team at the bedside on patient arrival. Labs drawn and patient cleared for CT by Dr. Billy Fischer. Patient to CT with team. Difficulty walking to CT scanner due to left leg weakness. NIHSS 2, see documentation for details and code stroke times. Patient with left leg weakness and left sensory deficits on exam. Also noted difficulty with speech fluency, but was able to verbalize all pictures and words. The following imaging was completed:  CT Head and CTA. Patient is a candidate for IV Thrombolytic. Very difficult IV stick. Multiple attempts with ultrasound. Delay in TNK due to IV access. TNK given at 1217.  Patient is not a candidate for IR due to no LVO.   Care Plan: admit to ICU for post TNK monitoring per protocol and order set.   Bedside handoff with ED RN.    Leverne Humbles Stroke Response RN

## 2022-12-17 NOTE — ED Provider Notes (Signed)
[place] Provider Note   CSN: [id number] Arrival date & time: 12/17/22  1059  An emergency department physician performed an initial assessment on this suspected stroke patient at 1129.  History {Add pertinent medical, surgical, social history, OB history to HPI:1} Chief Complaint  Patient presents with   Code Stroke    Danielle Kelley is a 42 y.o. female.  HPI     Home Medications Prior to Admission medications   Medication Sig Start Date End Date Taking? Authorizing Provider  aspirin 81 MG chewable tablet Chew 1 tablet (81 mg total) by mouth daily. 07/02/21   Love, Ivan Anchors, PA-C  atorvastatin (LIPITOR) 40 MG tablet Take 1 tablet (40 mg total) by mouth daily. 09/04/21   Raulkar, Clide Deutscher, MD  cyanocobalamin 1000 MCG tablet Take 1 tablet (1,000 mcg total) by mouth daily. 07/02/21   Love, Ivan Anchors, PA-C  ferrous sulfate 325 (65 FE) MG tablet Take 1 tablet (325 mg total) by mouth daily with breakfast. 07/02/21   Love, Ivan Anchors, PA-C  MOUNJARO 5 MG/0.5ML Pen Inject 5 mg into the skin once a week. 11/22/21   [provider]  Multiple Vitamin (MULTIVITAMIN WITH MINERALS) TABS tablet Take 1 tablet by mouth daily after supper. 07/02/21   Love, Ivan Anchors, PA-C  topiramate (TOPAMAX) 50 MG tablet Take 1 tablet (50 mg total) by mouth 2 (two) times daily. Patient not taking: Reported on 12/03/2021 09/17/21 12/16/21  Garvin Fila, MD      Allergies    Peanut-containing drug products    Review of Systems   Review of Systems  Physical Exam Updated Vital Signs BP (!) 159/97 (BP Location: Right Arm)   Pulse 67   Temp 98.1 F (36.7 C)   Resp 16   Ht 5' 10"$  (1.778 m)   Wt 126.8 kg   SpO2 99%   BMI 40.11 kg/m  Physical Exam  ED Results / Procedures / Treatments   Labs (all labs ordered are listed, but only abnormal results are displayed) Labs Reviewed  COMPREHENSIVE METABOLIC PANEL  ETHANOL  CBC WITH DIFFERENTIAL/PLATELET   HIV ANTIBODY (ROUTINE TESTING W REFLEX)  HEMOGLOBIN A1C  RAPID URINE DRUG SCREEN, HOSP PERFORMED  PREGNANCY, URINE  PROTIME-INR  APTT  I-STAT CHEM 8, ED  CBG MONITORING, ED  I-STAT BETA HCG BLOOD, ED ([facility], [facility], [facility] ONLY)    EKG None  Radiology CT ANGIO HEAD NECK W WO CM (CODE STROKE)  Result Date: 12/17/2022 CLINICAL DATA:  Neuro deficit, acute, stroke suspected EXAM: CT ANGIOGRAPHY HEAD AND NECK TECHNIQUE: Multidetector CT imaging of the head and neck was performed using the standard protocol during bolus administration of intravenous contrast. Multiplanar CT image reconstructions and MIPs were obtained to evaluate the vascular anatomy. Carotid stenosis measurements (when applicable) are obtained utilizing NASCET criteria, using the distal internal carotid diameter as the denominator. RADIATION DOSE REDUCTION: This exam was performed according to the departmental dose-optimization program which includes automated exposure control, adjustment of the mA and/or kV according to patient size and/or use of iterative reconstruction technique. CONTRAST:  48m OMNIPAQUE IOHEXOL 350 MG/ML SOLN COMPARISON:  Same day CT head. FINDINGS: CTA NECK FINDINGS Aortic arch: Great vessel origins are patent without significant stenosis. Right carotid system: No evidence of dissection, stenosis (50% or greater), or occlusion. Left carotid system: No evidence of dissection, stenosis (50% or greater), or occlusion. Vertebral arteries: Codominant. No evidence of dissection, stenosis (50% or greater), or occlusion. Skeleton: Negative. Other  neck: No acute findings on limited assessment. Upper chest: Visualized lung apices are clear. Review of the MIP images confirms the above findings CTA HEAD FINDINGS Anterior circulation: Bilateral intracranial ICAs, MCAs, and ACAs are patent without proximal hemodynamically significant stenosis. Posterior circulation: Bilateral intradural vertebral arteries, basilar artery and bilateral  posterior cerebral arteries are patent without proximal hemodynamically significant stenosis. Venous sinuses: As permitted by contrast timing, patent. Review of the MIP images confirms the above findings IMPRESSION: No large vessel occlusion or proximal hemodynamically significant stenosis. Electronically Signed   By: Margaretha Sheffield M.D.   On: 12/17/2022 12:42   CT HEAD CODE STROKE WO CONTRAST  Result Date: 12/17/2022 CLINICAL DATA:  Code stroke.  Neuro deficit, acute, stroke suspected EXAM: CT HEAD WITHOUT CONTRAST TECHNIQUE: Contiguous axial images were obtained from the base of the skull through the vertex without intravenous contrast. RADIATION DOSE REDUCTION: This exam was performed according to the departmental dose-optimization program which includes automated exposure control, adjustment of the mA and/or kV according to patient size and/or use of iterative reconstruction technique. COMPARISON:  CT head 06/24/2021. FINDINGS: Brain: No evidence of acute large vascular territory infarction, hemorrhage, hydrocephalus, extra-axial collection or mass lesion/mass effect. Streak artifact from an earring on the right. Vascular: No hyperdense vessel identified. Skull: No acute fracture. Sinuses/Orbits: Clear sinuses.  No acute orbital findings. Other: No mastoid effusions. ASPECTS [facility] Stroke Program Early CT Score) total score (0-10 with 10 being normal): 10 IMPRESSION: 1. No evidence of acute intracranial abnormality. 2. ASPECTS is 10 Code stroke imaging results were communicated on 12/17/2022 at 11:53 am to provider Dr. Leonel Ramsay via telephone, who verbally acknowledged these results. Electronically Signed   By: Margaretha Sheffield M.D.   On: 12/17/2022 11:53    Procedures Procedures  {Document cardiac monitor, telemetry assessment procedure when appropriate:1}  Medications Ordered in ED Medications  sodium chloride flush (NS) 0.9 % injection 3 mL (has no administration in time range)   stroke:  early stages of recovery book (has no administration in time range)  0.9 %  sodium chloride infusion (has no administration in time range)  acetaminophen (TYLENOL) tablet 650 mg (has no administration in time range)    Or  acetaminophen (TYLENOL) 160 MG/5ML solution 650 mg (has no administration in time range)    Or  acetaminophen (TYLENOL) suppository 650 mg (has no administration in time range)  senna-docusate (Senokot-S) tablet 1 tablet (has no administration in time range)  pantoprazole (PROTONIX) injection 40 mg (has no administration in time range)  labetalol (NORMODYNE) injection 20 mg (has no administration in time range)    And  clevidipine (CLEVIPREX) infusion 0.5 mg/mL (has no administration in time range)  labetalol (NORMODYNE) injection 10 mg (has no administration in time range)  atorvastatin (LIPITOR) tablet 80 mg (has no administration in time range)  tenecteplase (TNKASE) injection for Stroke 25 mg (25 mg Intravenous Given 12/17/22 1217)  iohexol (OMNIPAQUE) 350 MG/ML injection 75 mL (75 mLs Intravenous Contrast Given 12/17/22 1233)    ED Course/ Medical Decision Making/ A&P   {   Click here for ABCD2, HEART and other calculatorsREFRESH Note before signing :1}                          Medical Decision Making Amount and/or Complexity of Data Reviewed Labs: ordered. Radiology: ordered.  Risk Decision regarding hospitalization.   ***  {Document critical care time when appropriate:1} {Document review of labs and clinical decision tools  ie heart score, Chads2Vasc2 etc:1}  {Document your independent review of radiology images, and any outside records:1} {Document your discussion with family members, caretakers, and with consultants:1} {Document social determinants of health affecting pt's care:1} {Document your decision making why or why not admission, treatments were needed:1} Final Clinical Impression(s) / ED Diagnoses Final diagnoses:  None    Rx / DC  Orders ED Discharge Orders     None

## 2022-12-17 NOTE — H&P (Addendum)
Neurology H&P  CC: Code stroke   History is obtained from:patient and medical record   HPI: Danielle Kelley is a 42 y.o. female with past medical history of prior stroke, migraines, B12 deficiency, HLD and obesity who presents to the ED via private vehicle for acute onset of aphasia and stumbling out of the car this morning. LKW 0930. Code stroke was called in the ED. LKW 0930. Patient states she woke up in her normal state of health, and dropped her kids off at school. She went to work and stumbled out of the car, was still able to ambulate. While at work she developed acute onset of inability to get her words out and form complete sentences. And mild numbness on the left side of her body. NIHSS 3 Code stroke CT head with no acute process. There was a long delay in obtaining CTA and administering TNK due to no PIV access and difficult to obtain one. TNK was administered @ 1217. CTA head and neck with no LVO  Patient will be admitted to the Neuro ICU for further management  Risks, benefits, alternatives of IV thrombolysis were discussed and patient agreed to proceed. CT imaging was reviewed personally by Dr. Leonel Ramsay  prior to IV thrombolysis administration with no evidence of bleed. --  LKW: 0930 tpa given?: Yes @1217$  EVT: no LVO  Modified Rankin Scale: 0-Completely asymptomatic and back to baseline post- stroke  ROS: A 14 point ROS was performed and is negative except as noted in the HPI.   Past Medical History:  Diagnosis Date   Obesity    Seasonal allergies    Stroke [facility])      Family History  Problem Relation Age of Onset   Stroke Mother    Hypertension Mother    Hypertension Maternal Grandmother    Hypertension Maternal Grandfather    Sleep apnea Neg Hx      Social History:  reports that she has never smoked. She has never used smokeless tobacco. No history on file for alcohol use and drug use.   Exam: Current vital signs: BP (!) 159/97 (BP Location: Right Arm)    Pulse 67   Temp 98.1 F (36.7 C)   Resp 16   Ht 5' 10"$  (1.778 m)   Wt 126.8 kg   SpO2 99%   BMI 40.11 kg/m  Vital signs in last 24 hours: Temp:  [98.1 F (36.7 C)] 98.1 F (36.7 C) (02/14 1127) Pulse Rate:  [67] 67 (02/14 1124) Resp:  [16] 16 (02/14 1124) BP: (146-159)/(95-97) 159/97 (02/14 1147) SpO2:  [99 %] 99 % (02/14 1124) Weight:  [126.8 kg] 126.8 kg (02/14 1127)  Physical Exam  Constitutional: Appears well-developed and well-nourished.  Psych: Affect appropriate to situation Eyes: No scleral injection HENT: No OP obstrucion Head: Normocephalic.  Cardiovascular: Normal rate and regular rhythm.  Respiratory: Effort normal and breath sounds normal to anterior ascultation GI: Soft.  No distension. There is no tenderness.  Skin: WDI  Neuro: Mental Status: Patient is awake, alert, oriented to person, place, month, year, and situation. Patient is able to give a clear and coherent history. Has aphasia, speech is not fluent  Cranial Nerves: II: Visual Fields are full. Pupils are equal, round, and reactive to light.   III,IV, VI: EOMI without ptosis or diploplia.  V: Facial sensation is symmetric to temperature VII: Facial movement is symmetric.  VIII: hearing is intact to voice X: Uvula elevates symmetrically XI: Shoulder shrug is symmetric. XII: tongue is midline without  atrophy or fasciculations.  Motor: Tone is normal. Bulk is normal. 5/5 strength was present on the right, she has mild weakness of the left leg Sensory: Decreased on the left side  Cerebellar: FNF and HKS are intact bilaterally  NIHSS  1a Level of Conscious.: 0 1b LOC Questions: 0 1c LOC Commands: 0 2 Best Gaze: 0 3 Visual: 0 4 Facial Palsy: 0 5a Motor Arm - left: 0 5b Motor Arm - Right: 0 6a Motor Leg - Left: 1 6b Motor Leg - Right: 0 7 Limb Ataxia: 0 8 Sensory: 1 9 Best Language: 1 10 Dysarthria: 0 11 Extinct. and Inatten.: 0 TOTAL: 3  I have reviewed labs in epic and the results  pertinent to this consultation are:   I have reviewed the images obtained: Code stroke CT head no acute process, ASPECTS 10   CTA head and neck   Primary Diagnosis:  Suspect Acute right hemisphere ischemic stroke   Secondary Diagnosis: Essential (primary) hypertension and Morbid Obesity(BMI > 40)  Prior Hx of Stroke in 8/22 2D echo EF 60-65% [place] LE bilateral negative for DVT  TEE no evidence of a clot or PFO  Hypercoagulable panel labs were all negative.  ANA, ESR were normal  Sleep study negative for sleep apnea   Recommendations: - admit to Neuro ICU  - BP goal 180/105 s/p TNK - Cleviprex gtt if needed to maintain BP goal  - Labetalol PRN  - Bedside swallow eval. If passes can start heart healthy diet  - HgbA1c, fasting lipid panel - Brain imaging 24 hrs s/p TNK  - atorvastatin 71m  - NS @ 75 cc/hr  - MRI of the brain without contrast - Frequent neuro checks - Echocardiogram - Hold antiplatelets for 24 hrs s/p TNK  - Risk factor modification - Telemetry monitoring - PT consult, OT consult, Speech consult - Stroke team to follow  DBeulah GandyDNP, ACNPC-AG  [facility]  I was present for the entirety of the evaluation and management reflected in the above note.  She had difficulty walking as well as mild expressive aphasia and some left-sided weakness.  Though this is an unusual constellation of symptoms, given her history of stroke and the fact that the difficulty walking would be disabling I did discuss benefits, risks, and alternatives of tenecteplase with the patient who agreed with proceeding.   This patient is critically ill and at significant risk of neurological worsening, death and care requires constant monitoring of vital signs, hemodynamics,respiratory and cardiac monitoring, neurological assessment, discussion with family, other specialists and medical decision making of high complexity. I spent 55 minutes of neurocritical care time  in the care  of  this patient. This was time spent independent of any time provided by nurse practitioner or PA.  MRoland Rack MD [facility] [phone] If 7pm- 7am, please page neurology on call as [place] 12/17/2022  7:33 PM

## 2022-12-17 NOTE — Progress Notes (Signed)
  Echocardiogram 2D Echocardiogram has been performed.  Ronny Flurry 12/17/2022, 6:17 PM

## 2022-12-17 NOTE — Progress Notes (Signed)
RN was at [facility] ED checking in a separate patient from the clinic. At approximately 1050 patient asked this RN what to do if she thought she was having a stroke. Notified her that this RN does not work in the ED, but got her in a wheelchair and notified ED Tech and registration desk of patient's symptoms.   Patient reports LKW was 0930 this morning, she has a history of prior ischemic stroke. Reports increased confusion and slight aphasia since 0930 today. She began to lose her balance when symptoms first started and feels that she is being "pulled to the left." Upon assessment, patient had left-sided weakness and slight facial droop. She denies any vision changes. ED registration radioed for additional assistance, escorted patient to triage area.   Beryle Flock, RN

## 2022-12-17 NOTE — Progress Notes (Signed)
PHARMACIST CODE STROKE RESPONSE  Notified to mix TNK at 11:47 by Dr. Leonel Ramsay TNK preparation completed at 11:49  TNK dose = 25 mg IV over 5 seconds  Issues/delays encountered (if applicable): IV access, confirmation with radiology  Eliseo Gum, PharmD PGY1 Pharmacy Resident   12/17/2022  12:28 PM

## 2022-12-17 NOTE — Plan of Care (Signed)
  Problem: Education: Goal: Knowledge of disease or condition will improve Outcome: Progressing Goal: Knowledge of secondary prevention will improve (MUST DOCUMENT ALL) Outcome: Progressing Goal: Knowledge of patient specific risk factors will improve (Mark N/A or DELETE if not current risk factor) Outcome: Progressing   Problem: Ischemic Stroke/TIA Tissue Perfusion: Goal: Complications of ischemic stroke/TIA will be minimized Outcome: Progressing   Problem: Coping: Goal: Will verbalize positive feelings about self Outcome: Progressing Goal: Will identify appropriate support needs Outcome: Progressing   Problem: Health Behavior/Discharge Planning: Goal: Ability to manage health-related needs will improve Outcome: Progressing Goal: Goals will be collaboratively established with patient/family Outcome: Progressing   Problem: Self-Care: Goal: Ability to participate in self-care as condition permits will improve Outcome: Progressing Goal: Verbalization of feelings and concerns over difficulty with self-care will improve Outcome: Progressing Goal: Ability to communicate needs accurately will improve Outcome: Progressing   Problem: Nutrition: Goal: Risk of aspiration will decrease Outcome: Progressing Goal: Dietary intake will improve Outcome: Progressing   

## 2022-12-17 NOTE — ED Triage Notes (Addendum)
Pt brought in by coworker; began having aphasia, memory loss, L sided facial droop, numbness and LUE sensory loss (baseline mild memory loss, worse today), and L sided weakness at 0930; hx stroke 2022; pt states she got out of car, lost balance and stumbled, stumbled but was able to ambulate; takes ASA daily, took additional dose today and after symptoms started; mild ataxia LUE, endorses slight HA 3/10, no visual changes

## 2022-12-18 ENCOUNTER — Inpatient Hospital Stay (HOSPITAL_COMMUNITY): Payer: BC Managed Care – PPO

## 2022-12-18 ENCOUNTER — Other Ambulatory Visit: Payer: Self-pay

## 2022-12-18 DIAGNOSIS — I6300 Cerebral infarction due to thrombosis of unspecified precerebral artery: Secondary | ICD-10-CM

## 2022-12-18 LAB — CBC
HCT: 36.1 % (ref 36.0–46.0)
Hemoglobin: 11.8 g/dL — ABNORMAL LOW (ref 12.0–15.0)
MCH: 27.4 pg (ref 26.0–34.0)
MCHC: 32.7 g/dL (ref 30.0–36.0)
MCV: 83.8 fL (ref 80.0–100.0)
Platelets: 385 10*3/uL (ref 150–400)
RBC: 4.31 MIL/uL (ref 3.87–5.11)
RDW: 12.8 % (ref 11.5–15.5)
WBC: 4.5 10*3/uL (ref 4.0–10.5)
nRBC: 0 % (ref 0.0–0.2)

## 2022-12-18 LAB — LIPID PANEL
Cholesterol: 170 mg/dL (ref 0–200)
HDL: 58 mg/dL (ref >40)
LDL Cholesterol: 98 mg/dL (ref 0–99)
Total CHOL/HDL Ratio: 2.9 RATIO
Triglycerides: 68 mg/dL (ref <150)
VLDL: 14 mg/dL (ref 0–40)

## 2022-12-18 MED ORDER — ASPIRIN 81 MG PO TBEC
81.0000 mg | DELAYED_RELEASE_TABLET | Freq: Every day | ORAL | Status: DC
  Administered 2022-12-18 – 2022-12-19 (×2): 81 mg via ORAL
  Filled 2022-12-18 (×2): qty 1

## 2022-12-18 MED ORDER — HEPARIN SODIUM (PORCINE) 5000 UNIT/ML IJ SOLN
5000.0000 [IU] | Freq: Three times a day (TID) | INTRAMUSCULAR | Status: DC
  Administered 2022-12-18 – 2022-12-19 (×3): 5000 [IU] via SUBCUTANEOUS
  Filled 2022-12-18 (×3): qty 1

## 2022-12-18 MED ORDER — PANTOPRAZOLE SODIUM 40 MG PO TBEC
40.0000 mg | DELAYED_RELEASE_TABLET | Freq: Every day | ORAL | Status: DC
  Administered 2022-12-18: 40 mg via ORAL
  Filled 2022-12-18: qty 1

## 2022-12-18 NOTE — Evaluation (Signed)
Physical Therapy Evaluation Patient Details Name: Danielle Kelley MRN: [id number] DOB: 17-Jun-1981 Today's Date: 12/18/2022  History of Present Illness  42 yo female presents to [facility] on 2/14 with aphasia, L facial droop, L weakness, L sensory loss. TNK administered 1217 on 2/14. CTH negative, await MRI. PMH includes R corona radiata CVA 2022, gastric bypass sx, migraines.  Clinical Impression   Pt presents with [facility] strength, balance, activity tolerance, just demonstrates increased time to perform mobility tasks at this time suspect due to bedrest and limited mobility OOB since bedrest was lifted. Pt ambulated good hallway distance, navigated a flight of steps, all at mod I level. Pt with no focal neurologic deficits at this time, reports she feels back to baseline. PT to sign off, please reconsult if needed.        Recommendations for follow up therapy are one component of a multi-disciplinary discharge planning process, led by the attending physician.  Recommendations may be updated based on patient status, additional functional criteria and insurance authorization.  Follow Up Recommendations No PT follow up      Assistance Recommended at Discharge None  Patient can return home with the following       Equipment Recommendations None recommended by PT  Recommendations for Other Services       Functional Status Assessment Patient has not had a recent decline in their functional status     Precautions / Restrictions Precautions Precautions: None Restrictions Weight Bearing Restrictions: No      Mobility  Bed Mobility Overal bed mobility: Modified Independent                  Transfers Overall transfer level: Modified independent                      Ambulation/Gait Ambulation/Gait assistance: Modified independent (Device/Increase time) Gait Distance (Feet): 225 Feet Assistive device: None Gait Pattern/deviations: Step-through pattern       General Gait  Details: slowed speed, pt reports due to bedrest x24 hours, but otherwise WFL  Stairs Stairs: Yes Stairs assistance: Supervision Stair Management: One rail Right, Alternating pattern, Forwards Number of Stairs: 10    Wheelchair Mobility    Modified Rankin (Stroke Patients Only) Modified Rankin (Stroke Patients Only) Pre-Morbid Rankin Score: No symptoms Modified Rankin: No significant disability     Balance Overall balance assessment: Independent                                           Pertinent Vitals/Pain Pain Assessment Pain Assessment: No/denies pain    Home Living Family/patient expects to be discharged to:: Private residence Living Arrangements: Children (65 y/o and 23 y/o at home. 3rd is in college (40 y/o)) Available Help at Discharge: Family;Available 24 hours/day Type of Home: House Home Access: Stairs to enter;Other (comment) (from garage entrance: 3 steps) Entrance Stairs-Rails: Right;Left Entrance Stairs-Number of Steps: 5 Alternate Level Stairs-Number of Steps: flight Home Layout: Two level Home Equipment: Cane - single point Additional Comments: Does not use any DME. Works for [employer]. [place] 3 kids.    Prior Function Prior Level of Function : Independent/Modified Independent;Driving;Working/employed                     Hand Dominance   Dominant Hand: Right    Extremity/Trunk Assessment   Upper Extremity Assessment  Upper Extremity Assessment: Defer to OT evaluation LUE Deficits / Details: Full A/ROM in all ranges. MMT: Shoulder flexion: 5/5, abduction: 4+/5, horizontal abduction/adduction: 4+/5. 5/5 elbow flexion/extension. functional gross grasp. LUE Sensation: WNL LUE Coordination: decreased fine motor (Very mild decreased motor speed during finger to nose test.)    Lower Extremity Assessment Lower Extremity Assessment: Overall WFL for tasks assessed    Cervical / Trunk  Assessment Cervical / Trunk Assessment: Normal  Communication   Communication: No difficulties  Cognition Arousal/Alertness: Awake/alert Behavior During Therapy: WFL for tasks assessed/performed Overall Cognitive Status: Within Functional Limits for tasks assessed                                          General Comments      Exercises     Assessment/Plan    PT Assessment Patient does not need any further PT services  PT Problem List         PT Treatment Interventions      PT Goals (Current goals can be found in the Care Plan section)  Acute Rehab PT Goals Patient Stated Goal: home PT Goal Formulation: With patient Time For Goal Achievement: 12/18/22 Potential to Achieve Goals: Good    Frequency       Co-evaluation               AM-PAC PT "6 Clicks" Mobility  Outcome Measure Help needed turning from your back to your side while in a flat bed without using bedrails?: None Help needed moving from lying on your back to sitting on the side of a flat bed without using bedrails?: None Help needed moving to and from a bed to a chair (including a wheelchair)?: None Help needed standing up from a chair using your arms (e.g., wheelchair or bedside chair)?: None Help needed to walk in hospital room?: None Help needed climbing 3-5 steps with a railing? : None 6 Click Score: 24    End of Session   Activity Tolerance: Patient tolerated treatment well Patient left: in bed;with call bell/phone within reach Nurse Communication: Mobility status PT Visit Diagnosis: Other abnormalities of gait and mobility (R26.89)    Time: [id number] PT Time Calculation (min) (ACUTE ONLY): 12 min   Charges:   PT Evaluation $PT Eval Low Complexity: 1 Low          Stacie Glaze, PT DPT Acute Rehabilitation Services Pager [phone]  Office [phone]   Roxine Caddy E Ruffin Pyo 12/18/2022, 4:44 PM

## 2022-12-18 NOTE — Progress Notes (Signed)
PT Cancellation Note  Patient Details Name: Shaylah Lindau MRN: [id number] DOB: [birth year]   Cancelled Treatment:    Reason Eval/Treat Not Completed: Active bedrest order - will check back as medically appropriate.  Stacie Glaze, PT DPT Acute Rehabilitation Services Pager [phone]  Office [phone]    Louis Matte 12/18/2022, 9:40 AM

## 2022-12-18 NOTE — Evaluation (Signed)
Speech Language Pathology Evaluation Patient Details Name: Danielle Kelley MRN: [id number] DOB: 1980/12/22 Today's Date: 12/18/2022 Time: [id number] SLP Time Calculation (min) (ACUTE ONLY): 20 min  Problem List:  Patient Active Problem List   Diagnosis Date Noted   Vitamin B 12 deficiency 07/03/2021   Morbid obesity ([place]) 07/03/2021   New onset of headaches 07/03/2021   Stroke (cerebrum) ([place]) 06/28/2021   Iron deficiency anemia    Seasonal allergies    Acute CVA (cerebrovascular accident) ([place]) 06/25/2021   Cerebrovascular accident (CVA) ([place]) 06/24/2021   Past Medical History:  Past Medical History:  Diagnosis Date   Obesity    Seasonal allergies    Stroke [facility])    Past Surgical History:  Past Surgical History:  Procedure Laterality Date   ABDOMINAL SURGERY  04/06/2017   Gastric sleeve   BUBBLE STUDY N/A 06/26/2021   Procedure: BUBBLE STUDY;  Surgeon: Buford Dresser, MD;  Location: [facility] ENDOSCOPY;  Service: Cardiovascular;  Laterality: N/A;   CESAREAN SECTION     2004, 2006, 2007.   TEE WITHOUT CARDIOVERSION N/A 06/26/2021   Procedure: TRANSESOPHAGEAL ECHOCARDIOGRAM (TEE);  Surgeon: Buford Dresser, MD;  Location: [facility] ENDOSCOPY;  Service: Cardiovascular;  Laterality: N/A;   HPI:  42 y.o. female with past medical history of prior stroke, migraines, B12 deficiency, HLD and obesity who presents to the ED via private vehicle for acute onset of aphasia and stumbling out of the car on 2/14. While at work she developed acute onset of inability to get her words out and form complete sentences. And mild numbness on the left side of her body. There was a long delay in obtaining CTA and administering TNK due to no PIV access and difficult to obtain one. TNK was administered @ 1217. CTA head and neck with no LVO. MRI to be completed today.   Assessment / Plan / Recommendation Clinical Impression  Pt seen for speech/language cognitive assessment with pt able to express  wants/needs effectively, but intermittent word finding difficulty/aphasia noted within complex conversation, although pt able to use compensatory strategies effectively to convey information.  Pt's speech intelligible within conversation with normal vocal quality noted.  Ox4 and auditory comprehension WFL.  Pt appears to be functioning at baseline level as she had a prior CVA and denotes short-term memory deficits requiring compensatory strategies she uses effectively within her work environment.  She has a strong support staff at work and home and is not concerned with returning to work as she has learned to utilize resources and allocate responsibility prn when she is overwhelmed with workload. Word fluency task with time contraint was grossly within functional limits, but pt with disorganized thought pattern when retrieving information related to a category, but this may be baseline functioning.  Pt concerned about verbal expression and expressed interest re: ST being available prn either in acute setting and/or outpatient setting if memory/word finding does not return to baseline prior to returning to work. ST will f/u for cognitive re-assessment prn during acute stay.  May consider referral to OP SLP once discharged prn.  Thank you for this consult.    SLP Assessment  SLP Recommendation/Assessment: Patient needs continued Speech Language Pathology Services SLP Visit Diagnosis: Aphasia (R47.01);Cognitive communication deficit (R41.841)    Recommendations for follow up therapy are one component of a multi-disciplinary discharge planning process, led by the attending physician.  Recommendations may be updated based on patient status, additional functional criteria and insurance authorization.    Follow Up Recommendations  Follow physician's recommendations for  discharge plan and follow up therapies (potential for OP SLP prn)    Assistance Recommended at Discharge  Intermittent Supervision/Assistance   Functional Status Assessment Patient has had a recent decline in their functional status and demonstrates the ability to make significant improvements in function in a reasonable and predictable amount of time.  Frequency and Duration min 1 x/week  1 week      SLP Evaluation Cognition  Overall Cognitive Status: History of cognitive impairments - at baseline (likely at baseline, but pt wants ST on board prn) Arousal/Alertness: Awake/alert Orientation Level: Oriented X4 Attention: Sustained Sustained Attention: Appears intact Memory: Impaired Memory Impairment: Decreased short term memory;Decreased recall of new information;Retrieval deficit Decreased Short Term Memory: Verbal complex;Functional complex Awareness: Appears intact Problem Solving: Appears intact Safety/Judgment: Appears intact Comments: Pt concerned about intermittent aphasia and wants ST to be on board prn in acute setting and/or outpatient prn       Comprehension  Auditory Comprehension Overall Auditory Comprehension: Appears within functional limits for tasks assessed Conversation: Complex Other Conversation Comments: slight word finding noted within complex conversation, but functional for meeting needs/wants Interfering Components: Working memory EffectiveTechniques: Repetition Retail banker: Within Function Limits Reading Comprehension Reading Status: Within funtional limits    Expression Expression Primary Mode of Expression: Verbal Verbal Expression Overall Verbal Expression: Impaired at baseline Level of Generative/Spontaneous Verbalization: Conversation Naming: Impairment Responsive: 76-100% accurate Confrontation: Within functional limits Convergent: 75-100% accurate Divergent: 75-100% accurate Pragmatics: No impairment Non-Verbal Means of Communication: Not applicable Written Expression Dominant Hand: Right Written Expression: Not tested   Oral / Motor   Oral Motor/Sensory Function Overall Oral Motor/Sensory Function: Within functional limits Motor Speech Overall Motor Speech: Appears within functional limits for tasks assessed Respiration: Within functional limits Phonation: Normal Resonance: Within functional limits Articulation: Within functional limitis Intelligibility: Intelligible Motor Planning: Witnin functional limits Motor Speech Errors: Not applicable            Pat Lyfe Monger,M.S., CCC-SLP 12/18/2022, 2:53 PM

## 2022-12-18 NOTE — Progress Notes (Addendum)
STROKE TEAM PROGRESS NOTE   INTERVAL HISTORY Family is at the bedside.  Neurologically stable, hemodynamically stable.  MRI today and then plan to transfer out of the ICU.  She should continue aspirin 81 mg She presented with sudden onset of confusion, disorientation and some increased left-sided weakness.  She does have prior history of stroke and some residual left-sided deficits.  Neurological exam is much improved after TNK.  Blood pressure adequately controlled. Vitals:   12/18/22 0500 12/18/22 0600 12/18/22 0607 12/18/22 0709  BP: (!) 139/92 (!) 148/95 (!) 148/95 (!) 158/100  Pulse: 60  70   Resp: 15 (!) 21 17 15  $ Temp:      TempSrc:      SpO2: 100% 99% 99%   Weight:      Height:       CBC:  Recent Labs  Lab 12/17/22 1220 12/18/22 0549  WBC 6.1 4.5  NEUTROABS 3.4  --   HGB 12.5 11.8*  HCT 37.2 36.1  MCV 83.4 83.8  PLT 425* 0000000   Basic Metabolic Panel:  Recent Labs  Lab 12/17/22 1134 12/17/22 1219  NA 137 140  K 4.0 4.1  CL 103 103  CO2 26  --   GLUCOSE 80 72  BUN 6 8  CREATININE 0.80 0.70  CALCIUM 9.2  --    Lipid Panel:  Recent Labs  Lab 12/18/22 0549  CHOL 170  TRIG 68  HDL 58  CHOLHDL 2.9  VLDL 14  LDLCALC 98   HgbA1c:  Recent Labs  Lab 12/17/22 1220  HGBA1C 5.2   Urine Drug Screen: No results for input(s): "LABOPIA", "COCAINSCRNUR", "LABBENZ", "AMPHETMU", "THCU", "LABBARB" in the last 168 hours.  Alcohol Level  Recent Labs  Lab 12/17/22 1336  ETH <10    IMAGING past 24 hours ECHOCARDIOGRAM COMPLETE  Result Date: 12/17/2022    ECHOCARDIOGRAM REPORT   Patient Name:   Yaretzy Zupko Date of Exam: 12/17/2022 Medical Rec #:  [id number]      Height:       70.0 in Accession #:    [id number]     Weight:       279.5 lb Date of Birth:  06-Jul-1981      BSA:          2.407 m Patient Age:    21 years       BP:           132/91 mmHg Patient Gender: F              HR:           66 bpm. Exam Location:  Inpatient Procedure: 2D Echo, Cardiac Doppler, Color  Doppler and Strain Analysis Indications:    Stroke I63.9  History:        Patient has prior history of Echocardiogram examinations, most                 recent 06/24/2021. Stroke.  Sonographer:    Ronny Flurry Referring Phys: [place]  1. Left ventricular ejection fraction, by estimation, is 60 to 65%. The left ventricle has normal function. The left ventricle has no regional wall motion abnormalities. Left ventricular diastolic parameters were normal. The average left ventricular global longitudinal strain is -20.1 %. The global longitudinal strain is normal.  2. Right ventricular systolic function is normal. The right ventricular size is normal. Tricuspid regurgitation signal is inadequate for assessing PA pressure.  3. The mitral valve is normal in structure.  Trivial mitral valve regurgitation. No evidence of mitral stenosis.  4. The aortic valve is tricuspid. Aortic valve regurgitation is not visualized. No aortic stenosis is present.  5. The inferior vena cava is normal in size with greater than 50% respiratory variability, suggesting right atrial pressure of 3 mmHg. Comparison(s): No significant change from prior study. FINDINGS  Left Ventricle: Left ventricular ejection fraction, by estimation, is 60 to 65%. The left ventricle has normal function. The left ventricle has no regional wall motion abnormalities. The average left ventricular global longitudinal strain is -20.1 %. The global longitudinal strain is normal. The left ventricular internal cavity size was normal in size. There is no left ventricular hypertrophy. Left ventricular diastolic parameters were normal. Right Ventricle: The right ventricular size is normal. No increase in right ventricular wall thickness. Right ventricular systolic function is normal. Tricuspid regurgitation signal is inadequate for assessing PA pressure. Left Atrium: Left atrial size was normal in size. Right Atrium: Right atrial size was normal in  size. Pericardium: There is no evidence of pericardial effusion. Mitral Valve: The mitral valve is normal in structure. Trivial mitral valve regurgitation. No evidence of mitral valve stenosis. Tricuspid Valve: The tricuspid valve is normal in structure. Tricuspid valve regurgitation is not demonstrated. No evidence of tricuspid stenosis. Aortic Valve: The aortic valve is tricuspid. Aortic valve regurgitation is not visualized. No aortic stenosis is present. Aortic valve mean gradient measures 5.0 mmHg. Aortic valve peak gradient measures 8.8 mmHg. Aortic valve area, by VTI measures 2.84 cm. Pulmonic Valve: The pulmonic valve was normal in structure. Pulmonic valve regurgitation is not visualized. No evidence of pulmonic stenosis. Aorta: The aortic root and ascending aorta are structurally normal, with no evidence of dilitation. Venous: The inferior vena cava is normal in size with greater than 50% respiratory variability, suggesting right atrial pressure of 3 mmHg. IAS/Shunts: No atrial level shunt detected by color flow Doppler.  LEFT VENTRICLE PLAX 2D LVIDd:         4.50 cm   Diastology LVIDs:         2.90 cm   LV e' medial:    13.40 cm/s LV PW:         1.10 cm   LV E/e' medial:  6.4 LV IVS:        0.90 cm   LV e' lateral:   15.10 cm/s LVOT diam:     2.20 cm   LV E/e' lateral: 5.7 LV SV:         93 LV SV Index:   39        2D Longitudinal Strain LVOT Area:     3.80 cm  2D Strain GLS Avg:     -20.1 %                           3D Volume EF:                          3D EF:        54 %                          LV EDV:       157 ml                          LV ESV:       71  ml                          LV SV:        85 ml RIGHT VENTRICLE             IVC RV S prime:     15.50 cm/s  IVC diam: 1.70 cm TAPSE (M-mode): 2.3 cm LEFT ATRIUM             Index        RIGHT ATRIUM           Index LA diam:        3.30 cm 1.37 cm/m   RA Area:     17.30 cm LA Vol (A2C):   62.2 ml 25.85 ml/m  RA Volume:   42.90 ml  17.83 ml/m LA  Vol (A4C):   47.9 ml 19.90 ml/m LA Biplane Vol: 56.8 ml 23.60 ml/m  AORTIC VALVE AV Area (Vmax):    2.91 cm AV Area (Vmean):   2.71 cm AV Area (VTI):     2.84 cm AV Vmax:           148.00 cm/s AV Vmean:          104.000 cm/s AV VTI:            0.328 m AV Peak Grad:      8.8 mmHg AV Mean Grad:      5.0 mmHg LVOT Vmax:         113.33 cm/s LVOT Vmean:        74.233 cm/s LVOT VTI:          0.245 m LVOT/AV VTI ratio: 0.75  AORTA Ao Root diam: 2.95 cm Ao Asc diam:  3.40 cm MITRAL VALVE MV Area (PHT): 3.21 cm    SHUNTS MV Decel Time: 236 msec    Systemic VTI:  0.24 m MV E velocity: 85.90 cm/s  Systemic Diam: 2.20 cm MV A velocity: 72.30 cm/s MV E/A ratio:  1.19 Vishnu Priya Mallipeddi Electronically signed by Lorelee Cover Mallipeddi Signature Date/Time: 12/17/2022/6:51:40 PM    Final    CT ANGIO HEAD NECK W WO CM (CODE STROKE)  Result Date: 12/17/2022 CLINICAL DATA:  Neuro deficit, acute, stroke suspected EXAM: CT ANGIOGRAPHY HEAD AND NECK TECHNIQUE: Multidetector CT imaging of the head and neck was performed using the standard protocol during bolus administration of intravenous contrast. Multiplanar CT image reconstructions and MIPs were obtained to evaluate the vascular anatomy. Carotid stenosis measurements (when applicable) are obtained utilizing NASCET criteria, using the distal internal carotid diameter as the denominator. RADIATION DOSE REDUCTION: This exam was performed according to the departmental dose-optimization program which includes automated exposure control, adjustment of the mA and/or kV according to patient size and/or use of iterative reconstruction technique. CONTRAST:  35m OMNIPAQUE IOHEXOL 350 MG/ML SOLN COMPARISON:  Same day CT head. FINDINGS: CTA NECK FINDINGS Aortic arch: Great vessel origins are patent without significant stenosis. Right carotid system: No evidence of dissection, stenosis (50% or greater), or occlusion. Left carotid system: No evidence of dissection, stenosis (50% or  greater), or occlusion. Vertebral arteries: Codominant. No evidence of dissection, stenosis (50% or greater), or occlusion. Skeleton: Negative. Other neck: No acute findings on limited assessment. Upper chest: Visualized lung apices are clear. Review of the MIP images confirms the above findings CTA HEAD FINDINGS Anterior circulation: Bilateral intracranial ICAs, MCAs, and ACAs are patent without proximal hemodynamically significant stenosis. Posterior circulation: Bilateral intradural  vertebral arteries, basilar artery and bilateral posterior cerebral arteries are patent without proximal hemodynamically significant stenosis. Venous sinuses: As permitted by contrast timing, patent. Review of the MIP images confirms the above findings IMPRESSION: No large vessel occlusion or proximal hemodynamically significant stenosis. Electronically Signed   By: Margaretha Sheffield M.D.   On: 12/17/2022 12:42   CT HEAD CODE STROKE WO CONTRAST  Result Date: 12/17/2022 CLINICAL DATA:  Code stroke.  Neuro deficit, acute, stroke suspected EXAM: CT HEAD WITHOUT CONTRAST TECHNIQUE: Contiguous axial images were obtained from the base of the skull through the vertex without intravenous contrast. RADIATION DOSE REDUCTION: This exam was performed according to the departmental dose-optimization program which includes automated exposure control, adjustment of the mA and/or kV according to patient size and/or use of iterative reconstruction technique. COMPARISON:  CT head 06/24/2021. FINDINGS: Brain: No evidence of acute large vascular territory infarction, hemorrhage, hydrocephalus, extra-axial collection or mass lesion/mass effect. Streak artifact from an earring on the right. Vascular: No hyperdense vessel identified. Skull: No acute fracture. Sinuses/Orbits: Clear sinuses.  No acute orbital findings. Other: No mastoid effusions. ASPECTS [facility] Stroke Program Early CT Score) total score (0-10 with 10 being normal): 10 IMPRESSION: 1. No  evidence of acute intracranial abnormality. 2. ASPECTS is 10 Code stroke imaging results were communicated on 12/17/2022 at 11:53 am to provider Dr. Leonel Ramsay via telephone, who verbally acknowledged these results. Electronically Signed   By: Margaretha Sheffield M.D.   On: 12/17/2022 11:53    PHYSICAL EXAM Constitutional: Appears well-developed and well-nourished.  Cardiovascular: Normal rate and regular rhythm.  Respiratory: Effort normal and breath sounds normal to anterior ascultation   Neuro: Mental Status: Patient is awake, alert, oriented to person, place, month, year, and situation. Patient is able to give a clear and coherent history. Has aphasia, speech is not fluent   Cranial Nerves: II: Visual Fields are full. Pupils are equal, round, and reactive to light.   III,IV, VI: EOMI without ptosis or diploplia.  V: Facial sensation is symmetric to temperature VII: Facial movement is symmetric.  VIII: hearing is intact to voice X: Uvula elevates symmetrically XI: Shoulder shrug is symmetric. XII: tongue is midline without atrophy or fasciculations.  Motor: Tone is normal. Bulk is normal. 5/5 strength was present on the right, she has mild weakness of the left leg Sensory: Decreased to temperature on the left arm and leg Cerebellar: FNF and HKS are intact bilaterally  ASSESSMENT/PLAN [name] is a 42 y.o. female with history of stroke, migraines, B12 deficiency, HLD who initially presented with aphasia and stumbling out of the car yesterday morning. She received TNK in the ED and symptoms started to improve  Stroke aborted with TNK vs stroke like episode   Code Stroke CT head No acute abnormality. ASPECTS 10.    CTA head & neck No large vessel occlusion or proximal hemodynamically significant stenosis. MRI no acute intracranial abnormality 2D Echo EF 60-65% LDL 98 HgbA1c 5.2 VTE prophylaxis -heparin    Diet   Diet Heart Room service appropriate? Yes; Fluid  consistency: Thin   aspirin 81 mg daily prior to admission, now on aspirin 81 mg daily.  Therapy recommendations: Pending Disposition: Pending  Hypertension Stable Cleviprex off  PRN labetalol   Hyperlipidemia Home meds:  Lipitor 42m LDL 98, goal < 70 Increase lipitor 832mContinue statin at discharge  Other Stroke Risk Factors ETOH use, alcohol level <10, advised to drink no more than 1 drink(s) a day Obesity, Body mass index  is 40.11 kg/m., BMI >/= 30 associated with increased stroke risk, recommend weight loss, diet and exercise as appropriate  Hx stroke/TIA 06/24/2021- periventricular right posterior frontal corona radiata - likely small vessel disease given location and risk factors  Hypercoagulable panel labs were all negative. ANA, ESR were normal.  Migraines On topamax 22m  Evaluate for sleep apnea done 6 months ago- negative   Hospital day # 1  Patient seen and examined by NP/APP with MD. MD to update note as needed.   DJanine Ores DNP, FNP-BC [facility] Pager: [phone] I have personally obtained history,examined this patient, reviewed notes, independently viewed imaging studies, participated in medical decision making and plan of care.ROS completed by me personally and pertinent positives fully documented  I have made any additions or clarifications directly to the above note. Agree with note above.  Patient presented with sudden onset of confusion, disorientation and increased left-sided deficits prior history of stroke or severe left-sided weakness.  She received IV TNK and has made excellent recovery and MRI is negative for acute stroke.  Recommend strict control of blood pressure and close neurological monitoring as per post TNK protocol.  Mobilize out of bed.  Therapy consults.  Continue ongoing stroke workup.  Continue aspirin if no bleed on 24-hour post TNK CT.  Check TCD bubble study for PFO and lower extremity Dopplers for DVT.  Long  discussion with patient and answered questions.This patient is critically ill and at significant risk of neurological worsening, death and care requires constant monitoring of vital signs, hemodynamics,respiratory and cardiac monitoring, extensive review of multiple databases, frequent neurological assessment, discussion with family, other specialists and medical decision making of high complexity.I have made any additions or clarifications directly to the above note.This critical care time does not reflect procedure time, or teaching time or supervisory time of PA/NP/Med Resident etc but could involve care discussion time.  I spent 30 minutes of neurocritical care time  in the care of  this patient.      PAntony Contras MD Medical Director [facility] Pager: [phone]/15/2024 1:39 PM  To contact Stroke Continuity provider, please refer to [email or web address] After hours, contact General Neurology

## 2022-12-18 NOTE — Evaluation (Signed)
Occupational Therapy Evaluation Patient Details Name: Danielle Kelley MRN: [id number] DOB: 11-Jul-1981 Today's Date: 12/18/2022   History of Present Illness 42 y.o. female with past medical history of prior stroke, migraines, B12 deficiency, HLD and obesity who presents to the ED via private vehicle for acute onset of aphasia and stumbling out of the car on 2/14. While at work she developed acute onset of inability to get her words out and form complete sentences. And mild numbness on the left side of her body. There was a long delay in obtaining CTA and administering TNK due to no PIV access and difficult to obtain one. TNK was administered @ 1217. CTA head and neck with no LVO. MRI to be completed today.   Clinical Impression   Patient evaluated by Occupational Therapy with no further acute OT needs identified. All education has been completed and the patient has no further questions. Pt is presenting at or very near her baseline during OT evaluation. Pt demonstrates mild decreased motor speed during finger to nose test when testing the LUE. I do not see a need for follow up OT services as this should return to normal by itself. Pt verbalized understanding. See below for any follow-up Occupational Therapy or equipment needs. OT is signing off. Thank you for this referral.       Recommendations for follow up therapy are one component of a multi-disciplinary discharge planning process, led by the attending physician.  Recommendations may be updated based on patient status, additional functional criteria and insurance authorization.   Follow Up Recommendations  No OT follow up     Assistance Recommended at Discharge None     Functional Status Assessment  Patient has not had a recent decline in their functional status  Equipment Recommendations  None recommended by OT       Precautions / Restrictions Precautions Precautions: None Restrictions Weight Bearing Restrictions: No       Mobility Bed Mobility Overal bed mobility:  (up in recliner upon therapy arrival)   Patient Response: Cooperative  Transfers Overall transfer level: Modified independent Equipment used: None   General transfer comment: transferred from recliner to transport chair for MRI      Balance Overall balance assessment: Independent     ADL either performed or assessed with clinical judgement   ADL Overall ADL's : At baseline;Independent;Modified independent         Vision Baseline Vision/History: 1 Wears glasses (wears contacts) Ability to See in Adequate Light: 0 Adequate Patient Visual Report: No change from baseline Vision Assessment?: No apparent visual deficits            Pertinent Vitals/Pain Pain Assessment Pain Assessment: No/denies pain     Hand Dominance Right   Extremity/Trunk Assessment Upper Extremity Assessment Upper Extremity Assessment: Overall WFL for tasks assessed;LUE deficits/detail LUE Deficits / Details: Full A/ROM in all ranges. MMT: Shoulder flexion: 5/5, abduction: 4+/5, horizontal abduction/adduction: 4+/5. 5/5 elbow flexion/extension. functional gross grasp. LUE Sensation: WNL LUE Coordination: decreased fine motor (Very mild decreased motor speed during finger to nose test.)   Lower Extremity Assessment Lower Extremity Assessment: Defer to PT evaluation   Cervical / Trunk Assessment Cervical / Trunk Assessment: Normal   Communication Communication Communication: No difficulties   Cognition Arousal/Alertness: Awake/alert Behavior During Therapy: WFL for tasks assessed/performed Overall Cognitive Status: Within Functional Limits for tasks assessed                  Home Living Family/patient expects to be discharged to::  Private residence Living Arrangements: Children (71 y/o and 39 y/o at home. 3rd is in college (40 y/o)) Available Help at Discharge: Family;Available 24 hours/day Type of Home: House Home Access: Stairs to  enter;Other (comment) (from garage entrance: 3 steps) Entrance Stairs-Number of Steps: 5 Entrance Stairs-Rails: Right;Left Home Layout: Two level Alternate Level Stairs-Number of Steps: flight Alternate Level Stairs-Rails: Right Bathroom Shower/Tub: Occupational psychologist: Standard     Home Equipment: Cane - single point   Additional Comments: Does not use any DME. Works for [employer]. [place] with her 3 kids.      Prior Functioning/Environment Prior Level of Function : Independent/Modified Independent;Driving;Working/employed            OT Problem List: Decreased coordination      OT Treatment/Interventions:   Eval only   OT Goals(Current goals can be found in the care plan section) Acute Rehab OT Goals Patient Stated Goal: to go home  OT Frequency:         AM-PAC OT "6 Clicks" Daily Activity     Outcome Measure Help from another person eating meals?: None Help from another person taking care of personal grooming?: None Help from another person toileting, which includes using toliet, bedpan, or urinal?: None Help from another person bathing (including washing, rinsing, drying)?: None Help from another person to put on and taking off regular upper body clothing?: None Help from another person to put on and taking off regular lower body clothing?: None 6 Click Score: 24   End of Session Nurse Communication: Mobility status  Activity Tolerance: Patient tolerated treatment well Patient left: with nursing/sitter in room;Other (comment) (in transport chair)  OT Visit Diagnosis: Muscle weakness (generalized) (M62.81);Unsteadiness on feet (R26.81)                Time: [id number] OT Time Calculation (min): 20 min Charges:  OT General Charges $OT Visit: 1 Visit OT Evaluation $OT Eval Moderate Complexity: 1 Mod  Danielle Kelley, OTR/L,Danielle Kelley  Supplemental OT - [facility] and [facility] Secure Chat Preferred    Danielle Kelley 12/18/2022, 1:16  PM

## 2022-12-18 NOTE — Discharge Summary (Addendum)
Stroke Discharge Summary  Patient ID: Danielle Kelley   MRN: [id number]      DOB: [birth year]  Date of Admission: 12/17/2022 Date of Discharge: 12/19/2022  Attending Physician:  Antony Contras MD Consultant(s):   None Patient's PCP:  Andria Frames, PA-C  DISCHARGE DIAGNOSIS: Stroke aborted with TNK vs stroke like episode, possibly complex migraine with worsening of old deficits Principal Problem:   Stroke (cerebrum) ([place])   Allergies as of 12/19/2022       Reactions   Peanut-containing Drug Products Itching        Medication List     STOP taking these medications    cyanocobalamin 1000 MCG tablet   ferrous sulfate 325 (65 FE) MG tablet   topiramate 50 MG tablet Commonly known as: TOPAMAX       TAKE these medications    aspirin 81 MG chewable tablet Chew 1 tablet (81 mg total) by mouth daily.   atorvastatin 80 MG tablet Commonly known as: LIPITOR Take 1 tablet (80 mg total) by mouth daily. Start taking on: December 20, 2022 What changed:  medication strength how much to take   multivitamin with minerals Tabs tablet Take 1 tablet by mouth daily after supper.        LABORATORY STUDIES CBC    Component Value Date/Time   WBC 4.5 12/18/2022 0549   RBC 4.31 12/18/2022 0549   HGB 11.8 (L) 12/18/2022 0549   HCT 36.1 12/18/2022 0549   PLT 385 12/18/2022 0549   MCV 83.8 12/18/2022 0549   MCH 27.4 12/18/2022 0549   MCHC 32.7 12/18/2022 0549   RDW 12.8 12/18/2022 0549   LYMPHSABS 2.2 12/17/2022 1220   MONOABS 0.4 12/17/2022 1220   EOSABS 0.1 12/17/2022 1220   BASOSABS 0.0 12/17/2022 1220   CMP    Component Value Date/Time   NA 140 12/17/2022 1219   K 4.1 12/17/2022 1219   CL 103 12/17/2022 1219   CO2 26 12/17/2022 1134   GLUCOSE 72 12/17/2022 1219   BUN 8 12/17/2022 1219   CREATININE 0.70 12/17/2022 1219   CALCIUM 9.2 12/17/2022 1134   PROT 7.4 12/17/2022 1134   ALBUMIN 3.6 12/17/2022 1134   AST 22 12/17/2022 1134   ALT 17 12/17/2022  1134   ALKPHOS 59 12/17/2022 1134   BILITOT 0.5 12/17/2022 1134   GFRNONAA >60 12/17/2022 1134   COAGS Lab Results  Component Value Date   INR 1.0 12/17/2022   INR 0.9 06/24/2021   Lipid Panel    Component Value Date/Time   CHOL 170 12/18/2022 0549   TRIG 68 12/18/2022 0549   HDL 58 12/18/2022 0549   CHOLHDL 2.9 12/18/2022 0549   VLDL 14 12/18/2022 0549   LDLCALC 98 12/18/2022 0549   HgbA1C  Lab Results  Component Value Date   HGBA1C 5.2 12/17/2022   Urinalysis No results found for: "COLORURINE", "APPEARANCEUR", "LABSPEC", "PHURINE", "GLUCOSEU", "HGBUR", "BILIRUBINUR", "KETONESUR", "PROTEINUR", "UROBILINOGEN", "NITRITE", "LEUKOCYTESUR" Urine Drug Screen     Component Value Date/Time   LABOPIA NONE DETECTED 06/25/2021 0539   COCAINSCRNUR NONE DETECTED 06/25/2021 0539   LABBENZ NONE DETECTED 06/25/2021 0539   AMPHETMU NONE DETECTED 06/25/2021 0539   THCU NONE DETECTED 06/25/2021 0539   LABBARB NONE DETECTED 06/25/2021 0539    Alcohol Level    Component Value Date/Time   ETH <10 12/17/2022 1336     SIGNIFICANT DIAGNOSTIC STUDIES MR BRAIN WO CONTRAST  Result Date: 12/18/2022 CLINICAL DATA:  Acute onset aphasia and stumbling. EXAM:  MRI HEAD WITHOUT CONTRAST TECHNIQUE: Multiplanar, multiecho pulse sequences of the brain and surrounding structures were obtained without intravenous contrast. COMPARISON:  Same-day CT/CTA head and neck, brain MRI 06/24/2021. FINDINGS: Brain: There is no acute intracranial hemorrhage, extra-axial fluid collection, or acute infarct. Parenchymal volume is normal. The ventricles are normal in size. There is faint signal abnormality in the right corona radiata consistent with prior infarct as seen on the prior MRI from 2022. Parenchymal signal is otherwise normal. The pituitary and suprasellar region are normal. There is no mass lesion. There is no mass effect or midline shift. Vascular: Normal flow voids. Skull and upper cervical spine: Normal  marrow signal. Sinuses/Orbits: The paranasal sinuses are clear. The globes and orbits are unremarkable. Other: None. IMPRESSION: No acute intracranial pathology. Electronically Signed   By: Valetta Mole M.D.   On: 12/18/2022 13:03   ECHOCARDIOGRAM COMPLETE  Result Date: 12/17/2022    ECHOCARDIOGRAM REPORT   Patient Name:   Danielle Kelley Date of Exam: 12/17/2022 Medical Rec #:  [id number]      Height:       70.0 in Accession #:    [id number]     Weight:       279.5 lb Date of Birth:  05-Jan-1981      BSA:          2.407 m Patient Age:    42 years       BP:           132/91 mmHg Patient Gender: F              HR:           66 bpm. Exam Location:  Inpatient Procedure: 2D Echo, Cardiac Doppler, Color Doppler and Strain Analysis Indications:    Stroke I63.9  History:        Patient has prior history of Echocardiogram examinations, most                 recent 06/24/2021. Stroke.  Sonographer:    Ronny Flurry Referring Phys: Council  1. Left ventricular ejection fraction, by estimation, is 60 to 65%. The left ventricle has normal function. The left ventricle has no regional wall motion abnormalities. Left ventricular diastolic parameters were normal. The average left ventricular global longitudinal strain is -20.1 %. The global longitudinal strain is normal.  2. Right ventricular systolic function is normal. The right ventricular size is normal. Tricuspid regurgitation signal is inadequate for assessing PA pressure.  3. The mitral valve is normal in structure. Trivial mitral valve regurgitation. No evidence of mitral stenosis.  4. The aortic valve is tricuspid. Aortic valve regurgitation is not visualized. No aortic stenosis is present.  5. The inferior vena cava is normal in size with greater than 50% respiratory variability, suggesting right atrial pressure of 3 mmHg. Comparison(s): No significant change from prior study. FINDINGS  Left Ventricle: Left ventricular ejection fraction, by  estimation, is 60 to 65%. The left ventricle has normal function. The left ventricle has no regional wall motion abnormalities. The average left ventricular global longitudinal strain is -20.1 %. The global longitudinal strain is normal. The left ventricular internal cavity size was normal in size. There is no left ventricular hypertrophy. Left ventricular diastolic parameters were normal. Right Ventricle: The right ventricular size is normal. No increase in right ventricular wall thickness. Right ventricular systolic function is normal. Tricuspid regurgitation signal is inadequate for assessing PA pressure. Left Atrium: Left atrial size was normal  in size. Right Atrium: Right atrial size was normal in size. Pericardium: There is no evidence of pericardial effusion. Mitral Valve: The mitral valve is normal in structure. Trivial mitral valve regurgitation. No evidence of mitral valve stenosis. Tricuspid Valve: The tricuspid valve is normal in structure. Tricuspid valve regurgitation is not demonstrated. No evidence of tricuspid stenosis. Aortic Valve: The aortic valve is tricuspid. Aortic valve regurgitation is not visualized. No aortic stenosis is present. Aortic valve mean gradient measures 5.0 mmHg. Aortic valve peak gradient measures 8.8 mmHg. Aortic valve area, by VTI measures 2.84 cm. Pulmonic Valve: The pulmonic valve was normal in structure. Pulmonic valve regurgitation is not visualized. No evidence of pulmonic stenosis. Aorta: The aortic root and ascending aorta are structurally normal, with no evidence of dilitation. Venous: The inferior vena cava is normal in size with greater than 50% respiratory variability, suggesting right atrial pressure of 3 mmHg. IAS/Shunts: No atrial level shunt detected by color flow Doppler.  LEFT VENTRICLE PLAX 2D LVIDd:         4.50 cm   Diastology LVIDs:         2.90 cm   LV e' medial:    13.40 cm/s LV PW:         1.10 cm   LV E/e' medial:  6.4 LV IVS:        0.90 cm   LV e'  lateral:   15.10 cm/s LVOT diam:     2.20 cm   LV E/e' lateral: 5.7 LV SV:         93 LV SV Index:   39        2D Longitudinal Strain LVOT Area:     3.80 cm  2D Strain GLS Avg:     -20.1 %                           3D Volume EF:                          3D EF:        54 %                          LV EDV:       157 ml                          LV ESV:       71 ml                          LV SV:        85 ml RIGHT VENTRICLE             IVC RV S prime:     15.50 cm/s  IVC diam: 1.70 cm TAPSE (M-mode): 2.3 cm LEFT ATRIUM             Index        RIGHT ATRIUM           Index LA diam:        3.30 cm 1.37 cm/m   RA Area:     17.30 cm LA Vol (A2C):   62.2 ml 25.85 ml/m  RA Volume:   42.90 ml  17.83 ml/m LA Vol (A4C):   47.9 ml 19.90 ml/m LA Biplane Vol: 56.8 ml  23.60 ml/m  AORTIC VALVE AV Area (Vmax):    2.91 cm AV Area (Vmean):   2.71 cm AV Area (VTI):     2.84 cm AV Vmax:           148.00 cm/s AV Vmean:          104.000 cm/s AV VTI:            0.328 m AV Peak Grad:      8.8 mmHg AV Mean Grad:      5.0 mmHg LVOT Vmax:         113.33 cm/s LVOT Vmean:        74.233 cm/s LVOT VTI:          0.245 m LVOT/AV VTI ratio: 0.75  AORTA Ao Root diam: 2.95 cm Ao Asc diam:  3.40 cm MITRAL VALVE MV Area (PHT): 3.21 cm    SHUNTS MV Decel Time: 236 msec    Systemic VTI:  0.24 m MV E velocity: 85.90 cm/s  Systemic Diam: 2.20 cm MV A velocity: 72.30 cm/s MV E/A ratio:  1.19 Vishnu Priya Mallipeddi Electronically signed by Lorelee Cover Mallipeddi Signature Date/Time: 12/17/2022/6:51:40 PM    Final    CT ANGIO HEAD NECK W WO CM (CODE STROKE)  Result Date: 12/17/2022 CLINICAL DATA:  Neuro deficit, acute, stroke suspected EXAM: CT ANGIOGRAPHY HEAD AND NECK TECHNIQUE: Multidetector CT imaging of the head and neck was performed using the standard protocol during bolus administration of intravenous contrast. Multiplanar CT image reconstructions and MIPs were obtained to evaluate the vascular anatomy. Carotid stenosis measurements (when  applicable) are obtained utilizing NASCET criteria, using the distal internal carotid diameter as the denominator. RADIATION DOSE REDUCTION: This exam was performed according to the departmental dose-optimization program which includes automated exposure control, adjustment of the mA and/or kV according to patient size and/or use of iterative reconstruction technique. CONTRAST:  65m OMNIPAQUE IOHEXOL 350 MG/ML SOLN COMPARISON:  Same day CT head. FINDINGS: CTA NECK FINDINGS Aortic arch: Great vessel origins are patent without significant stenosis. Right carotid system: No evidence of dissection, stenosis (50% or greater), or occlusion. Left carotid system: No evidence of dissection, stenosis (50% or greater), or occlusion. Vertebral arteries: Codominant. No evidence of dissection, stenosis (50% or greater), or occlusion. Skeleton: Negative. Other neck: No acute findings on limited assessment. Upper chest: Visualized lung apices are clear. Review of the MIP images confirms the above findings CTA HEAD FINDINGS Anterior circulation: Bilateral intracranial ICAs, MCAs, and ACAs are patent without proximal hemodynamically significant stenosis. Posterior circulation: Bilateral intradural vertebral arteries, basilar artery and bilateral posterior cerebral arteries are patent without proximal hemodynamically significant stenosis. Venous sinuses: As permitted by contrast timing, patent. Review of the MIP images confirms the above findings IMPRESSION: No large vessel occlusion or proximal hemodynamically significant stenosis. Electronically Signed   By: FMargaretha SheffieldM.D.   On: 12/17/2022 12:42   CT HEAD CODE STROKE WO CONTRAST  Result Date: 12/17/2022 CLINICAL DATA:  Code stroke.  Neuro deficit, acute, stroke suspected EXAM: CT HEAD WITHOUT CONTRAST TECHNIQUE: Contiguous axial images were obtained from the base of the skull through the vertex without intravenous contrast. RADIATION DOSE REDUCTION: This exam was  performed according to the departmental dose-optimization program which includes automated exposure control, adjustment of the mA and/or kV according to patient size and/or use of iterative reconstruction technique. COMPARISON:  CT head 06/24/2021. FINDINGS: Brain: No evidence of acute large vascular territory infarction, hemorrhage, hydrocephalus, extra-axial collection or mass lesion/mass effect. Streak  artifact from an earring on the right. Vascular: No hyperdense vessel identified. Skull: No acute fracture. Sinuses/Orbits: Clear sinuses.  No acute orbital findings. Other: No mastoid effusions. ASPECTS [facility] Stroke Program Early CT Score) total score (0-10 with 10 being normal): 10 IMPRESSION: 1. No evidence of acute intracranial abnormality. 2. ASPECTS is 10 Code stroke imaging results were communicated on 12/17/2022 at 11:53 am to provider Dr. Leonel Ramsay via telephone, who verbally acknowledged these results. Electronically Signed   By: Margaretha Sheffield M.D.   On: 12/17/2022 11:53      HISTORY OF PRESENT ILLNESS [name] is a 42 y.o. female with history of stroke, migraines, B12 deficiency, HLD who initially presented with aphasia and stumbling out of the car yesterday morning. She received TNK in the ED and symptoms started to improve. MRI was noted to be negative for stroke.  HOSPITAL COURSE Code Stroke CT head No acute abnormality. ASPECTS 10.    CTA head & neck No large vessel occlusion or proximal hemodynamically significant stenosis. MRI no acute intracranial abnormality 2D Echo EF 60-65% LDL 98 HgbA1c 5.2 VTE prophylaxis -heparin aspirin 81 mg daily prior to admission, now on aspirin 81 mg daily.  Therapy recommendations: Pending Disposition: Pending   Hypertension Stable Cleviprex off  PRN labetalol    Hyperlipidemia Home meds:  Lipitor 56m LDL 98, goal < 70 Increase lipitor 837mContinue statin at discharge   Other Stroke Risk Factors ETOH use, alcohol level  <10, advised to drink no more than 1 drink(s) a day Obesity, Body mass index is 40.11 kg/m., BMI >/= 30 associated with increased stroke risk, recommend weight loss, diet and exercise as appropriate  Hx stroke/TIA 06/24/2021- periventricular right posterior frontal corona radiata - likely small vessel disease given location and risk factors  Hypercoagulable panel labs were all negative. ANA, ESR were normal.  Migraines On topamax 5016mEvaluate for sleep apnea done 6 months ago- negative   DISCHARGE EXAM Blood pressure (!) 148/92, pulse 70, temperature 98.7 F (37.1 C), temperature source Oral, resp. rate 18, height 5' 10"$  (1.778 m), weight 126.8 kg, SpO2 99 %. General: Alert, well-nourished, well-developed patient in no acute distress Respiratory: Regular, unlabored respirations on room air  NEURO:  Mental Status: AA&Ox3  Speech/Language: speech is without dysarthria or aphasia.    Cranial Nerves:  II: PERRL. Visual fields full.  III, IV, VI: EOMI. Eyelids elevate symmetrically.  V: Sensation is intact to light touch and somewhat diminished on the left VII: Smile is symmetrical.  VIII: hearing intact to voice. IX, X: Phonation is normal.  XI:[id number] 5/5. XII: tongue is midline without fasciculations. Motor: 5/5 strength to all muscle groups tested.  Tone: is normal and bulk is normal Sensation- Intact to light touch bilaterally but slightly diminished on the left Coordination: FTN intact bilaterally.No drift.  Gait- deferred   Discharge Diet       Diet   Diet Heart Room service appropriate? Yes; Fluid consistency: Thin   liquids  DISCHARGE PLAN Disposition:  Home aspirin 81 mg daily for secondary stroke prevention  Ongoing stroke risk factor control by Primary Care Physician at time of discharge Follow-up PCP [facility] Meo PA-C in 2 weeks. [facility] in 8 weeks, office to schedule an appointment.   32  minutes were spent preparing discharge.  CorTopaz Ranch EstatesMSN, AGACNP-BC [facility] See Amion for schedule and pager information 12/19/2022 2:15 PM  I have personally obtained  history,examined this patient, reviewed notes, independently viewed imaging studies, participated in medical decision making and plan of care.ROS completed by me personally and pertinent positives fully documented  I have made any additions or clarifications directly to the above note. Agree with note above.    Antony Contras, MD Medical Director [facility] Pager: [phone] 12/19/2022 2:48 PM

## 2022-12-19 DIAGNOSIS — I6300 Cerebral infarction due to thrombosis of unspecified precerebral artery: Secondary | ICD-10-CM | POA: Diagnosis not present

## 2022-12-19 MED ORDER — ATORVASTATIN CALCIUM 80 MG PO TABS: 80.0000 mg | ORAL_TABLET | Freq: Every day | ORAL | 1 refills | Status: AC

## 2022-12-19 NOTE — Discharge Instructions (Addendum)
Danielle Kelley, you were admitted to the hospital with aphasia and difficulty ambulating.  Your symptoms were thought to be due to a stroke, and you were treated with TNK.  However, your MRI was negative for acute stroke.  You will need to continue taking aspirin daily and will increase your dose of atorvastatin to 80 mg daily.  You will need to follow up in the stroke clinic in 6-8 weeks.  You may return to work on Monday.

## 2023-03-31 ENCOUNTER — Other Ambulatory Visit: Payer: Self-pay

## 2023-03-31 NOTE — Patient Outreach (Signed)
First telephone outreach attempt to obtain mRS. No answer. Left message for returned call.  Malory Spurr [facility]-Care Management Assistant [phone]  

## 2023-04-02 ENCOUNTER — Other Ambulatory Visit: Payer: Self-pay

## 2023-04-02 NOTE — Patient Outreach (Signed)
Second telephone outreach attempt to obtain mRS. No answer. Left message for returned call.  Carmellia Kreisler [facility]-Care Management Assistant [phone]  

## 2023-04-03 ENCOUNTER — Other Ambulatory Visit: Payer: Self-pay

## 2023-04-03 NOTE — Patient Outreach (Signed)
3 outreach attempts were completed to obtain mRs. mRs could not be obtained because patient never returned my calls. mRs=7    Senaida Chilcote Care Management Assistant [phone]  

## 2023-08-10 IMAGING — CT CT HEAD W/O CM
3 series · 14 of 47 positions shown, 16 images · non-contrast
Comparison: None.

CLINICAL DATA: Neuro deficit, acute, stroke suspected. Left-sided
weakness beginning yesterday.

EXAM:
CT HEAD WITHOUT CONTRAST
TECHNIQUE: Contiguous axial images were obtained from the base of the skull
through the vertex without intravenous contrast.

[Series 3: head 2.0 h70h · axial · 0.48mm/px · z∈[-136,+8]mm · 8 of 84 slices shown, 10 images]
[im 6/84  brain]
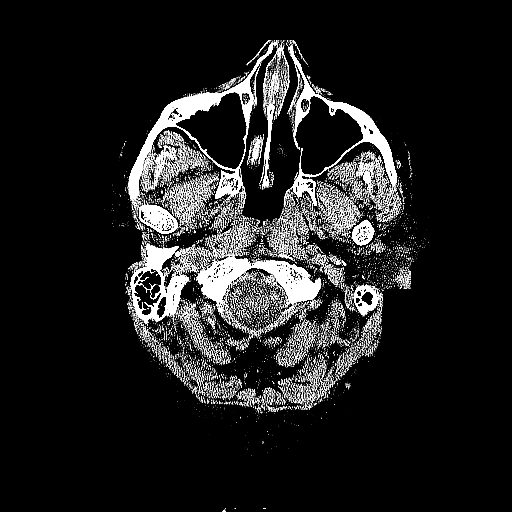
[im 6/84  bone]
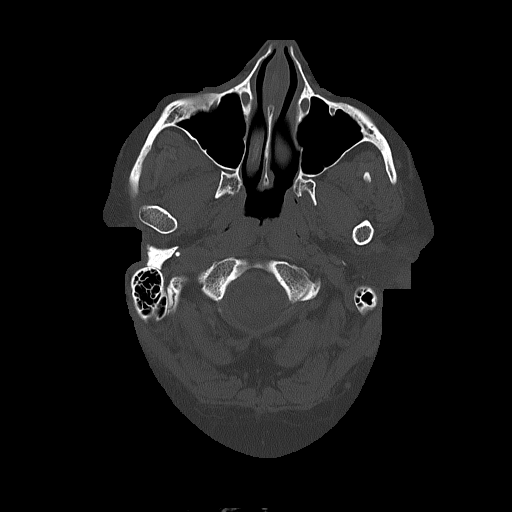
[im 18/84  brain]
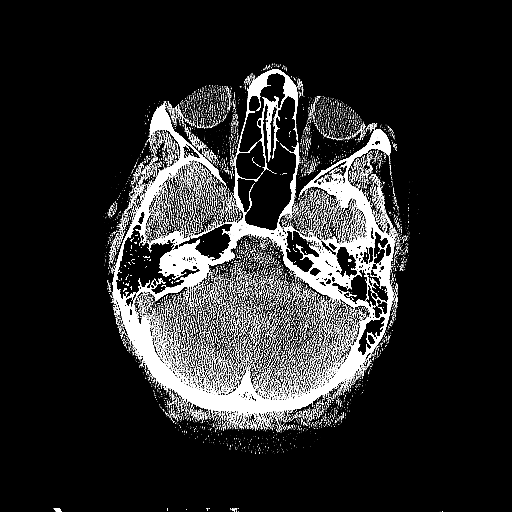
[im 26/84  brain]
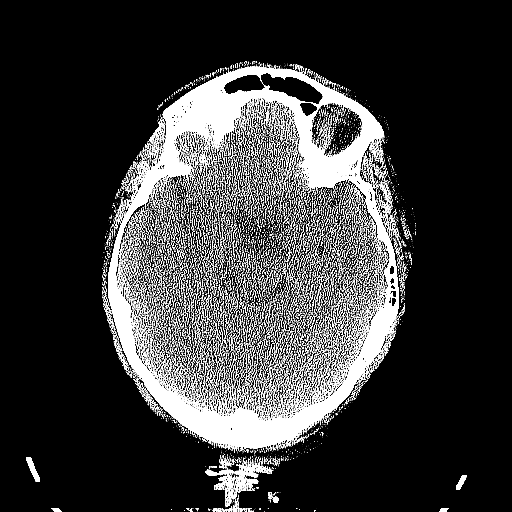
[im 38/84  brain]
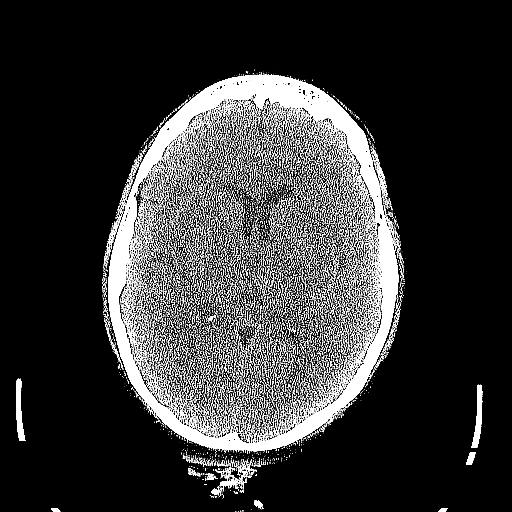
[im 46/84  brain]
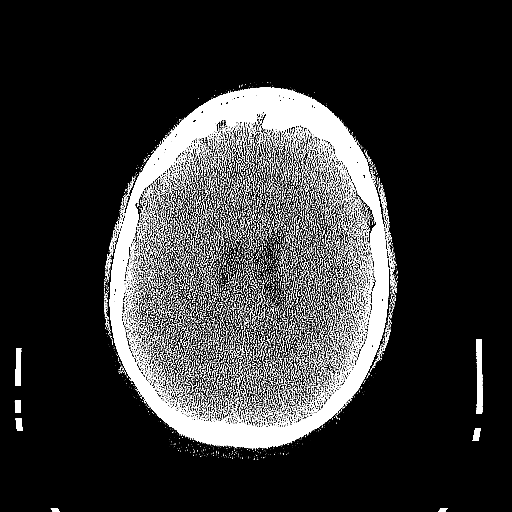
[im 46/84  bone]
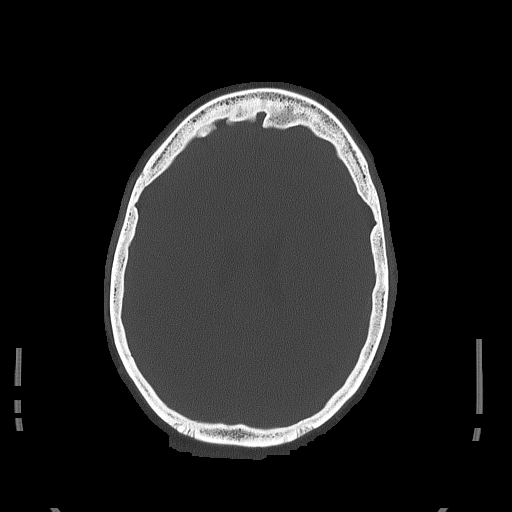
[im 58/84  brain]
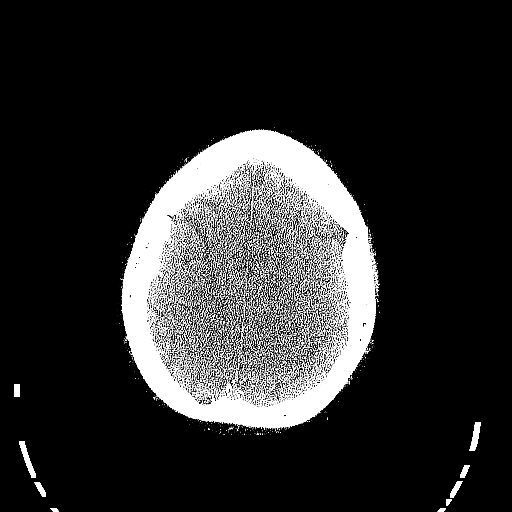
[im 66/84  brain]
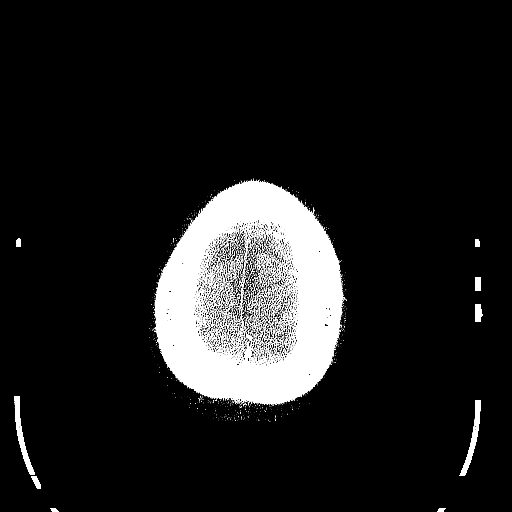
[im 78/84  brain]
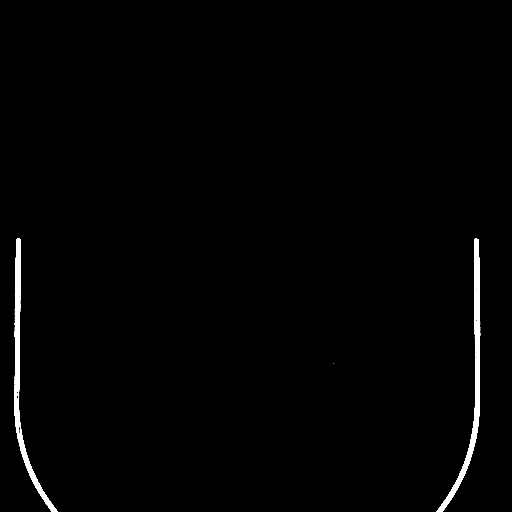

[Series 5: head 3.0 mpr cor · coronal · 0.30mm/px · 3 of 67 slices shown]
[im 23/67  brain]
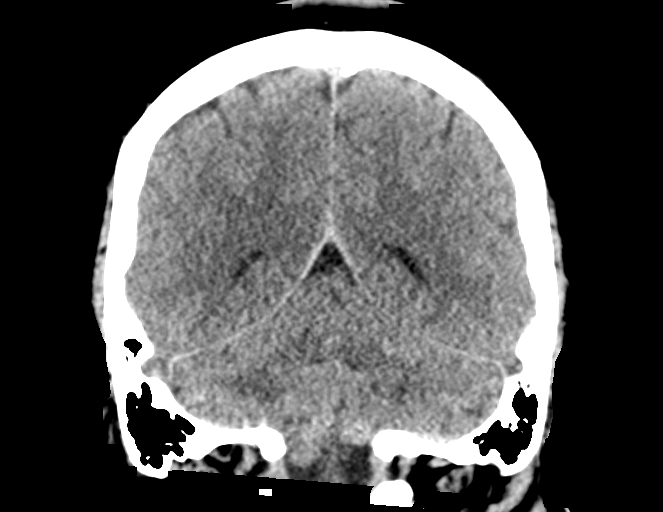
[im 30/67  brain]
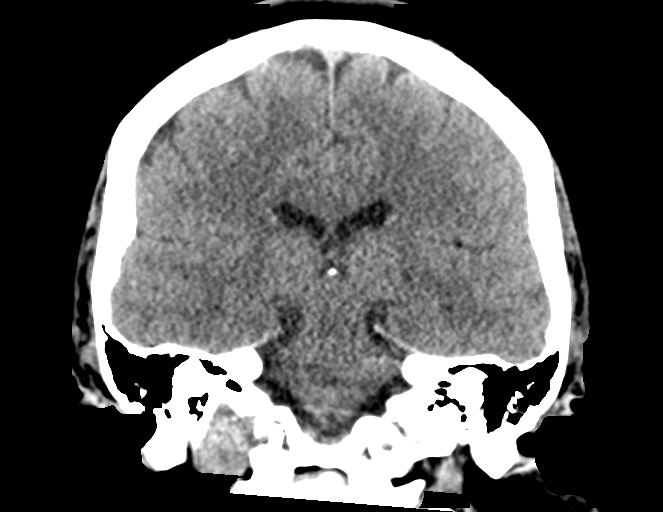
[im 37/67  brain]
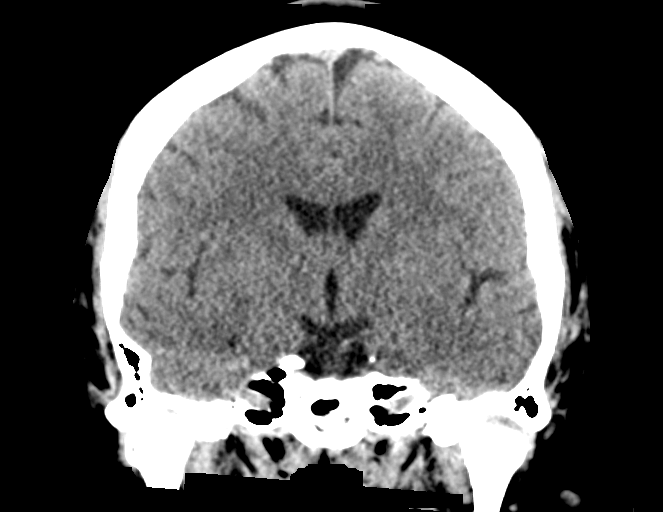

[Series 6: head 3.0 mpr sag · sagittal · 0.30mm/px · 3 of 59 slices shown]
[im 20/59  brain]
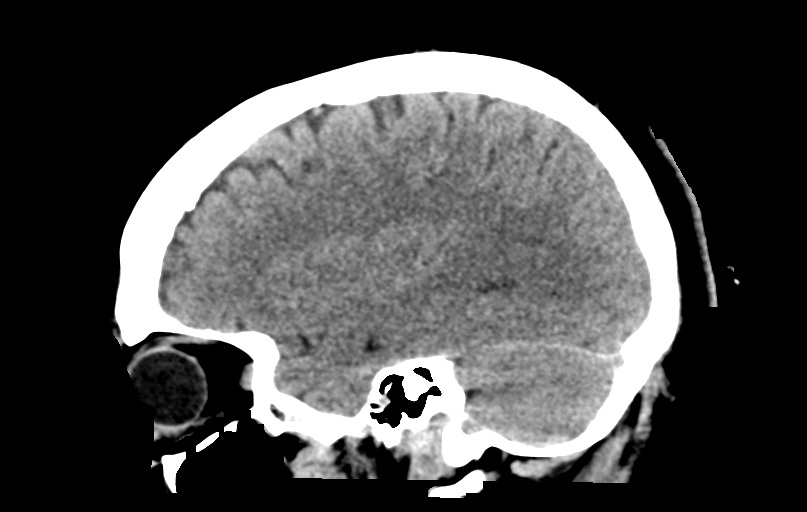
[im 30/59  brain]
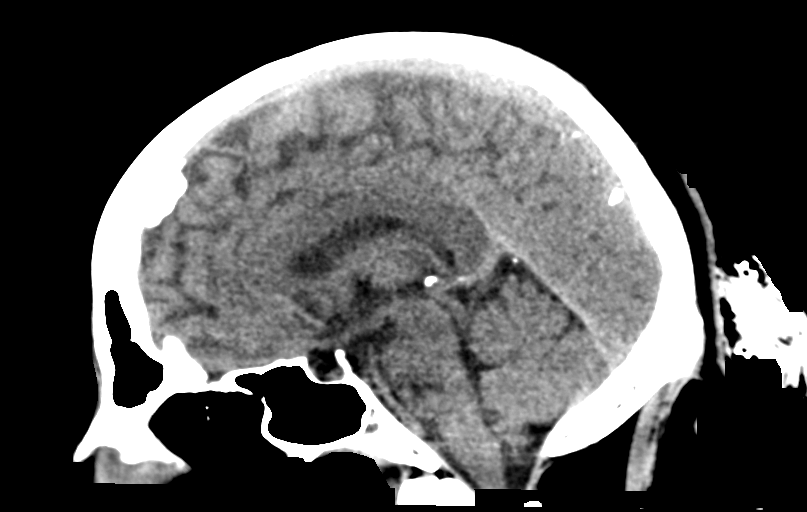
[im 39/59  brain]
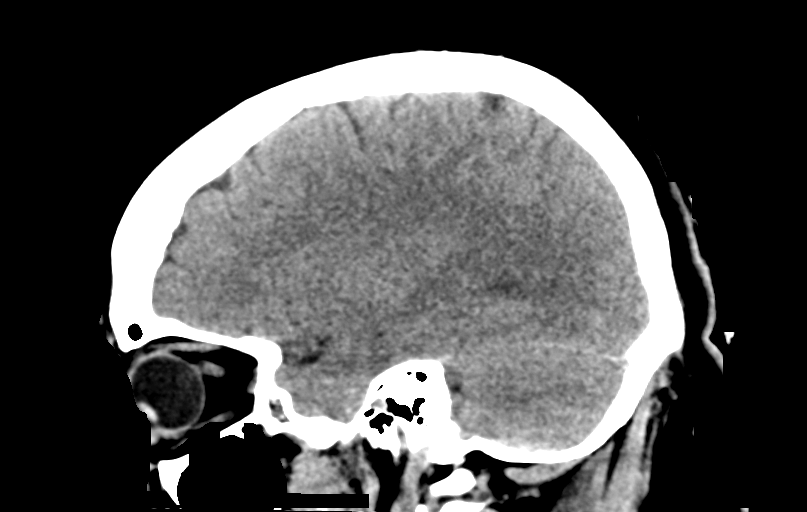

[14 of 47 positions shown; findings below may reference images not displayed]

FINDINGS: Brain: The brain shows a normal appearance without evidence of
malformation, atrophy, old or acute small or large vessel
infarction, mass lesion, hemorrhage, hydrocephalus or extra-axial
collection.

Vascular: No hyperdense vessel. No evidence of atherosclerotic
calcification.

Skull: Normal.  No traumatic finding.  No focal bone lesion.

Sinuses/Orbits: Sinuses are clear. Orbits appear normal. Mastoids
are clear.

Other: None significant
IMPRESSION: Normal head CT.

## 2023-11-10 ENCOUNTER — Telehealth: Payer: Self-pay | Admitting: Neurology

## 2023-11-10 NOTE — Telephone Encounter (Signed)
 Pt scheduled appt due to worsening symptoms. Wait listed

## 2024-01-25 ENCOUNTER — Telehealth: Payer: Self-pay | Admitting: Neurology

## 2024-01-25 ENCOUNTER — Ambulatory Visit: Payer: BC Managed Care – PPO | Admitting: Neurology

## 2024-01-25 NOTE — Telephone Encounter (Signed)
 Patient no showed for an appointment today; she was erroneously scheduled with me.  Please make sure she gets rescheduled with Dr. Pearlean Brownie (her primary neurologist) or Ihor Austin, NP as planned.

## 2024-01-25 NOTE — Telephone Encounter (Signed)
 FYI in case patient calls back to schedule again.

## 2024-01-28 NOTE — Telephone Encounter (Signed)
 Pt called to try and be scheduled for her migraines.  Phone rep reached out to Crawford, NP and relayed to pt that since it has been 2.5 years her PCP could send a referral for migraines, and in doing so could possibly be seen earlier that way instead of a office visit appointment with Dr Pearlean Brownie (which are booking into Dec). Pt will consider and call back.

## 2024-03-15 ENCOUNTER — Encounter: Attending: Physical Medicine and Rehabilitation | Admitting: Physical Medicine and Rehabilitation

## 2024-03-15 ENCOUNTER — Encounter: Payer: Self-pay | Admitting: Physical Medicine and Rehabilitation

## 2024-03-15 VITALS — BP 134/89 | HR 77 | Ht 70.0 in | Wt 228.0 lb

## 2024-03-15 DIAGNOSIS — G8929 Other chronic pain: Secondary | ICD-10-CM | POA: Insufficient documentation

## 2024-03-15 DIAGNOSIS — R519 Headache, unspecified: Secondary | ICD-10-CM | POA: Diagnosis present

## 2024-03-15 DIAGNOSIS — R41840 Attention and concentration deficit: Secondary | ICD-10-CM | POA: Diagnosis present

## 2024-03-15 DIAGNOSIS — R4189 Other symptoms and signs involving cognitive functions and awareness: Secondary | ICD-10-CM | POA: Diagnosis not present

## 2024-03-15 MED ORDER — LISDEXAMFETAMINE DIMESYLATE 10 MG PO CAPS: 10.0000 mg | ORAL_CAPSULE | Freq: Every day | ORAL | 0 refills | Status: DC

## 2024-03-15 NOTE — Patient Instructions (Signed)
 Red light therapy

## 2024-03-15 NOTE — Progress Notes (Signed)
 Subjective:    Patient ID: Danielle Kelley, female    DOB: 1981-02-10, 43 y.o.   MRN: [id number]  HPI Danielle Kelley is a 43 year old woman who presents for hospital follow-up after CIR admission for CVA.  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 Screens worsen her headaches.  -she is exhausted after bringing her kids home. -she is experiencing a lack of patience.  -she removes herself from difficult conversations -at home she would rather be in the room by herself which is not like her.  -this has not impacted her kids much but she distance herself from family members she couldn't tolerate.   -she has more clear boundaries.   2) Morbid obesity -weight is 302 lbs today -she does intermittent fasting -she eats between 12 and 6 -she has started to walk outside with her kids  3) Headaches: -topamax  is helping- she uses this every other day and does not use it when she does not have migraines she does not use it.  -more intense but less frequent  4) cognitive impairment -persists -never heard from SLP -she has to give presentations at work, it is hard for her to this- she has to go to trainings twice.  -she loses her train of thought very easily -pre-stroke subjects she has to think about harder but is able to compensat.  -thi is still an issue for her  5) impaired attention: -getting worse -homeopathic treatments have not helped -she was just accepted in [facility] program   Pain Inventory Average Pain 4 Pain Right Now 2 My pain is tingling  In the last 24 hours, has pain interfered with the following? General activity 3 Relation with others 0 Enjoyment of life 0 What TIME of day is your pain at its worst? morning  Sleep (in general)  Fair  Pain is worse with: some activites Pain improves with: rest Relief from Meds: 8  Family History  Problem Relation Age of Onset   Stroke Mother    Hypertension Mother    Hypertension Maternal Grandmother    Hypertension Maternal Grandfather    Sleep apnea Neg Hx    Social History   Socioeconomic History   Marital status: Single    Spouse name: Not on file   Number of children: Not on file   Years of education: Not on file   Highest education level: Not on file  Occupational History   Not on file  Tobacco Use   Smoking status: Never   Smokeless tobacco: Never  Vaping Use   Vaping status: Never Used  Substance and Sexual Activity   Alcohol use: Not on file   Drug use: Not on file   Sexual activity: Not on file  Other Topics Concern   Not on file  Social History Narrative   Not on file   Social Drivers of Health   Financial Resource Strain: Not on file  Food Insecurity: Low Risk  (11/16/2023)   Received from [facility]   Hunger Vital Sign    Worried About Running Out of Food in the Last Year: Never true    Ran Out of Food in the Last Year: Never true  Transportation Needs: No Transportation Needs (11/16/2023)  Received from [facility]    In the past 12 months, has lack of reliable transportation kept you from medical appointments, meetings, work or from getting things needed for daily living? : No  Physical Activity: Not on file  Stress: Not on file  Social Connections: Not on file   Past Surgical History:  Procedure Laterality Date   ABDOMINAL SURGERY  04/06/2017   Gastric sleeve   BUBBLE STUDY N/A 06/26/2021   Procedure: BUBBLE STUDY;  Surgeon: Sheryle Donning, MD;  Location: [facility] ENDOSCOPY;  Service: Cardiovascular;  Laterality: N/A;   CESAREAN SECTION     2004, 2006, 2007.   TEE WITHOUT CARDIOVERSION N/A 06/26/2021   Procedure: TRANSESOPHAGEAL ECHOCARDIOGRAM (TEE);  Surgeon: Sheryle Donning, MD;  Location: [facility]  ENDOSCOPY;  Service: Cardiovascular;  Laterality: N/A;   Past Medical History:  Diagnosis Date   Obesity    Seasonal allergies    Stroke (HCC)    BP 134/89   Pulse 77   Ht 5\' 10"  (1.778 m)   Wt 228 lb (103.4 kg)   SpO2 98%   BMI 32.71 kg/m   Opioid Risk Score:   Fall Risk Score:  `1  Depression screen [facility] 2/9     03/15/2024   10:30 AM 07/31/2021    2:21 PM  Depression screen PHQ 2/9  Decreased Interest 0 0  Down, Depressed, Hopeless 0 0  PHQ - 2 Score 0 0  Altered sleeping  1  Tired, decreased energy  2  Change in appetite  1  Feeling bad or failure about yourself   0  Trouble concentrating  2  Moving slowly or fidgety/restless  2  Suicidal thoughts  0  PHQ-9 Score  8  Difficult doing work/chores  Somewhat difficult     Review of Systems  Constitutional: Negative.   HENT: Negative.    Eyes: Negative.   Respiratory: Negative.    Endocrine: Negative.   Genitourinary: Negative.   Allergic/Immunologic: Negative.   Neurological: Negative.   All other systems reviewed and are negative.      Objective:   Physical Exam Gen: no distress, normal appearing HEENT: oral mucosa pink and moist, NCAT Cardio: Reg rate Chest: normal effort, normal rate of breathing Abd: soft, non-distended Ext: no edema Psych: pleasant, normal affect Skin: intact Neuro: Word finding difficulties, impaired attention, impaired memory- continues    Assessment & Plan:  Danielle Kelley is a 43 year old woman who presents for hospital follow-up after CIR admission for CVA.  1) CVA -MRI Brain reviewed and shows a small acute infarct in the periventricular right posterior frontal corona radiata, immediately superior to and potentially involving the right thalamus. Faint edema without mass effect. MRA shows no large vessel occlusion or proximal hemodynamically significant stenosis. -continue therapies -continue using cane in stores to minimize fall risk.  -provided dietary and exercise  counseling -discussed that hypertension is number one reversible risk factor for stroke.  -recommended NAC or Cefarolin NAC for fatigue and cognitive impairments and she plans to use latter.  -provided counseling regarding return to work, currently planning to return October 24th- at this time we discussed starting with half days to help minimize her screen time.  -Has returned to work.   2) Obesity: -Educated that current weight is 282 lbs, lost 20 lbs since last visit! -Educated regarding health benefits of weight loss- for pain, general health, chronic disease prevention, immune health, mental health.  -Will monitor weight every visit.  -Consider Roobois tea daily.  -  Discussed the benefits of intermittent fasting. -Discussed foods that can assist in weight loss: 1) leafy greens- high in fiber and nutrients 2) dark chocolate- improves metabolism (if prefer sweetened, best to sweeten with honey instead of sugar).  3) cruciferous vegetables- high in fiber and protein 4) full fat yogurt: high in healthy fat, protein, calcium , and probiotics 5) apples- high in a variety of phytochemicals 6) nuts- high in fiber and protein that increase feelings of fullness 7) grapefruit: rich in nutrients, antioxidants, and fiber (not to be taken with anticoagulation) 8) beans- high in protein and fiber 9) salmon- has high quality protein and healthy fats 10) green tea- rich in polyphenols 11) eggs- rich in choline and vitamin D 12) tuna- high protein, boosts metabolism 13) avocado- decreases visceral abdominal fat 14) chicken (pasture raised): high in protein and iron 15) blueberries- reduce abdominal fat and cholesterol 16) whole grains- decreases calories retained during digestion, speeds metabolism 17) chia seeds- curb appetite 18) chilies- increases fat metabolism  -Discussed supplements that can be used:  1) Metatrim 400mg  BID 30 minutes before breakfast and dinner  2) Sphaeranthus indicus and  Garcinia mangostana (combinations of these and #1 can be found in capsicum and zychrome  3) green coffee bean extract 400mg  twice per day or Irvingia (african mango) 150 to 300mg  twice per day.   3) Headaches -continue Topamax , provided with refill -discussed elimination diet -provided note to say that she is sensitive to fluorescent lights  4) Prediabetes:: -check CBGs daily, log, and bring log to follow-up appointment -avoid sugar, bread, pasta, rice -avoid snacking -perform daily foot exam and at least annual eye exam -try to incorporate into your diet some of the following foods which are good for diabetes: 1) cinnamon- imitates effects of insulin, increasing glucose transport into cells ([place] or [place] cinnamon is best, least processed) 2) nuts- can slow down the blood sugar response of carbohydrate rich foods 3) oatmeal- contains and anti-inflammatory compound avenanthramide 4) whole-milk yogurt (best types are no sugar, [place] yogurt, or goat/sheep yogurt) 5) beans- high in protein, fiber, and vitamins, low glycemic index 6) broccoli- great source of vitamin A and C 7) quinoa- higher in protein and fiber than other grains 8) spinach- high in vitamin A, fiber, and protein 9) olive oil- reduces glucose levels, LDL, and triglycerides 10) salmon- excellent amount of omega-3-fatty acids 11) walnuts- rich in antioxidants 12) apples- high in fiber and quercetin 13) carrots- highly nutritious with low impact on blood sugar 14) eggs- improve HDL (good cholesterol), high in protein, keep you satiated 15) turmeric: improves blood sugars, cardiovascular disease, and protects kidney health 16) garlic: improves blood sugar, blood pressure, pain 17) tomatoes: highly nutritious with low impact on blood sugar   5) Cognitive impairment: -prescribed SLO -discussed her current cognitive difficulties. -discussed Cefazolin NAC -Recommended starting NAC (N-Acetyl cysteine) 600mg  BID for her  fatigue. Discussed its benefits in boosting glutathione and mitochondrial function.  -discussed Ritalin and Modafinil.    6) Impaired attention: -discussed ritalin, Modafinil, Concerta, Vyvanse, psychiatry referral -discussed her concerns with ritalin -discussed avoiding proceed and inflammatory foods -discussed that when she is interrupted it work it is very hard for her to get back on trach -prescribed Vyvanse  7) Tingling: -discussed that it is no longer painful -discussed that she gets scared that her arm goes asleep

## 2024-03-24 ENCOUNTER — Ambulatory Visit: Admitting: Neurology

## 2024-05-17 ENCOUNTER — Other Ambulatory Visit: Payer: Self-pay | Admitting: Physical Medicine and Rehabilitation

## 2024-05-17 ENCOUNTER — Telehealth: Payer: Self-pay | Admitting: Physical Medicine and Rehabilitation

## 2024-05-17 MED ORDER — LISDEXAMFETAMINE DIMESYLATE 10 MG PO CAPS: 10.0000 mg | ORAL_CAPSULE | Freq: Every day | ORAL | 0 refills | Status: DC

## 2024-05-17 NOTE — Telephone Encounter (Signed)
 Patient calling about a refill for lisdexamfetamine (VYVANSE ). She started on 10mg   and was told that she could up the dose to 20mg  if needed per Dr Lorilee. She says she is completely out of the medication. Pt called and LVM 07/14.

## 2024-06-27 ENCOUNTER — Encounter: Attending: Physical Medicine and Rehabilitation | Admitting: Physical Medicine and Rehabilitation

## 2024-06-27 DIAGNOSIS — R4189 Other symptoms and signs involving cognitive functions and awareness: Secondary | ICD-10-CM | POA: Insufficient documentation

## 2024-06-27 DIAGNOSIS — R519 Headache, unspecified: Secondary | ICD-10-CM | POA: Insufficient documentation

## 2024-06-27 DIAGNOSIS — G8929 Other chronic pain: Secondary | ICD-10-CM | POA: Insufficient documentation

## 2024-06-27 DIAGNOSIS — R41840 Attention and concentration deficit: Secondary | ICD-10-CM | POA: Insufficient documentation

## 2024-06-27 NOTE — Progress Notes (Deleted)
 Subjective:    Patient ID: Danielle Kelley, female    DOB: 06/29/1981, 43 y.o.   MRN: [id number]  HPI Danielle Kelley is a 43 year old woman who presents for hospital follow-up after CIR admission for CVA.  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 Screens worsen her headaches.  -she is exhausted after bringing her kids home. -she is experiencing a lack of patience.  -she removes herself from difficult conversations -at home she would rather be in the room by herself which is not like her.  -this has not impacted her kids much but she distance herself from family members she couldn't tolerate.   -she has more clear boundaries.   2) Morbid obesity -weight is 302 lbs today -she does intermittent fasting -she eats between 12 and 6 -she has started to walk outside with her kids  3) Headaches: -topamax  is helping- she uses this every other day and does not use it when she does not have migraines she does not use it.  -more intense but less frequent  4) cognitive impairment -persists -never heard from SLP -she has to give presentations at work, it is hard for her to this- she has to go to trainings twice.  -she loses her train of thought very easily -pre-stroke subjects she has to think about harder but is able to compensat.  -thi is still an issue for her  5) impaired attention: -getting worse -homeopathic treatments have not helped -she was just accepted in [facility] program   Pain Inventory Average Pain 4 Pain Right Now 2 My pain is tingling  In the last 24 hours, has pain interfered with the following? General activity 3 Relation with others 0 Enjoyment of life 0 What TIME of day is your pain at its worst? morning  Sleep (in general)  Fair  Pain is worse with: some activites Pain improves with: rest Relief from Meds: 8  Family History  Problem Relation Age of Onset   Stroke Mother    Hypertension Mother    Hypertension Maternal Grandmother    Hypertension Maternal Grandfather    Sleep apnea Neg Hx    Social History   Socioeconomic History   Marital status: Single    Spouse name: Not on file   Number of children: Not on file   Years of education: Not on file   Highest education level: Not on file  Occupational History   Not on file  Tobacco Use   Smoking status: Never   Smokeless tobacco: Never  Vaping Use   Vaping status: Never Used  Substance and Sexual Activity   Alcohol use: Not on file   Drug use: Not on file   Sexual activity: Not on file  Other Topics Concern   Not on file  Social History Narrative   Not on file   Social Drivers of Health   Financial Resource Strain: Not on file  Food Insecurity: Low Risk  (11/16/2023)   Received from [facility]   Hunger Vital Sign    Within the past 12 months, you worried that your food would run out before you got money to buy more: Never true    Within the past 12 months, the food you bought  just didn't last and you didn't have money to get more. : Never true  Transportation Needs: No Transportation Needs (11/16/2023)   Received from [facility]   Transportation    In the past 12 months, has lack of reliable transportation kept you from medical appointments, meetings, work or from getting things needed for daily living? : No  Physical Activity: Not on file  Stress: Not on file  Social Connections: Not on file   Past Surgical History:  Procedure Laterality Date   ABDOMINAL SURGERY  04/06/2017   Gastric sleeve   BUBBLE STUDY N/A 06/26/2021   Procedure: BUBBLE STUDY;  Surgeon: Lonni Slain, MD;  Location: [facility] ENDOSCOPY;  Service: Cardiovascular;  Laterality: N/A;   CESAREAN SECTION     2004, 2006, 2007.   TEE WITHOUT CARDIOVERSION N/A  06/26/2021   Procedure: TRANSESOPHAGEAL ECHOCARDIOGRAM (TEE);  Surgeon: Lonni Slain, MD;  Location: [facility] ENDOSCOPY;  Service: Cardiovascular;  Laterality: N/A;   Past Medical History:  Diagnosis Date   Obesity    Seasonal allergies    Stroke (HCC)    There were no vitals taken for this visit.  Opioid Risk Score:   Fall Risk Score:  `1  Depression screen [facility] 2/9     03/15/2024   10:30 AM 07/31/2021    2:21 PM  Depression screen PHQ 2/9  Decreased Interest 0 0  Down, Depressed, Hopeless 0 0  PHQ - 2 Score 0 0  Altered sleeping  1  Tired, decreased energy  2  Change in appetite  1  Feeling bad or failure about yourself   0  Trouble concentrating  2  Moving slowly or fidgety/restless  2  Suicidal thoughts  0  PHQ-9 Score  8  Difficult doing work/chores  Somewhat difficult     Review of Systems  Constitutional: Negative.   HENT: Negative.    Eyes: Negative.   Respiratory: Negative.    Endocrine: Negative.   Genitourinary: Negative.   Allergic/Immunologic: Negative.   Neurological: Negative.   All other systems reviewed and are negative.      Objective:   Physical Exam Gen: no distress, normal appearing HEENT: oral mucosa pink and moist, NCAT Cardio: Reg rate Chest: normal effort, normal rate of breathing Abd: soft, non-distended Ext: no edema Psych: pleasant, normal affect Skin: intact Neuro: Word finding difficulties, impaired attention, impaired memory- continues    Assessment & Plan:  Danielle Kelley is a 43 year old woman who presents for hospital follow-up after CIR admission for CVA.  1) CVA -MRI Brain reviewed and shows a small acute infarct in the periventricular right posterior frontal corona radiata, immediately superior to and potentially involving the right thalamus. Faint edema without mass effect. MRA shows no large vessel occlusion or proximal hemodynamically significant stenosis. -continue therapies -continue using cane in stores to  minimize fall risk.  -provided dietary and exercise counseling -discussed that hypertension is number one reversible risk factor for stroke.  -recommended NAC or Cefarolin NAC for fatigue and cognitive impairments and she plans to use latter.  -provided counseling regarding return to work, currently planning to return October 24th- at this time we discussed starting with half days to help minimize her screen time.  -Has returned to work.   2) Obesity: -Educated that current weight is 282 lbs, lost 20 lbs since last visit! -Educated regarding health benefits of weight loss- for pain, general health, chronic disease prevention, immune health, mental health.  -Will monitor weight every visit.  -Consider Roobois tea  daily.  -Discussed the benefits of intermittent fasting. -Discussed foods that can assist in weight loss: 1) leafy greens- high in fiber and nutrients 2) dark chocolate- improves metabolism (if prefer sweetened, best to sweeten with honey instead of sugar).  3) cruciferous vegetables- high in fiber and protein 4) full fat yogurt: high in healthy fat, protein, calcium , and probiotics 5) apples- high in a variety of phytochemicals 6) nuts- high in fiber and protein that increase feelings of fullness 7) grapefruit: rich in nutrients, antioxidants, and fiber (not to be taken with anticoagulation) 8) beans- high in protein and fiber 9) salmon- has high quality protein and healthy fats 10) green tea- rich in polyphenols 11) eggs- rich in choline and vitamin D 12) tuna- high protein, boosts metabolism 13) avocado- decreases visceral abdominal fat 14) chicken (pasture raised): high in protein and iron 15) blueberries- reduce abdominal fat and cholesterol 16) whole grains- decreases calories retained during digestion, speeds metabolism 17) chia seeds- curb appetite 18) chilies- increases fat metabolism  -Discussed supplements that can be used:  1) Metatrim 400mg  BID 30 minutes before  breakfast and dinner  2) Sphaeranthus indicus and Garcinia mangostana (combinations of these and #1 can be found in capsicum and zychrome  3) green coffee bean extract 400mg  twice per day or Irvingia (african mango) 150 to 300mg  twice per day.   3) Headaches -continue Topamax , provided with refill -discussed elimination diet -provided note to say that she is sensitive to fluorescent lights  4) Prediabetes:: -check CBGs daily, log, and bring log to follow-up appointment -avoid sugar, bread, pasta, rice -avoid snacking -perform daily foot exam and at least annual eye exam -try to incorporate into your diet some of the following foods which are good for diabetes: 1) cinnamon- imitates effects of insulin, increasing glucose transport into cells ([place] or [place] cinnamon is best, least processed) 2) nuts- can slow down the blood sugar response of carbohydrate rich foods 3) oatmeal- contains and anti-inflammatory compound avenanthramide 4) whole-milk yogurt (best types are no sugar, [place] yogurt, or goat/sheep yogurt) 5) beans- high in protein, fiber, and vitamins, low glycemic index 6) broccoli- great source of vitamin A and C 7) quinoa- higher in protein and fiber than other grains 8) spinach- high in vitamin A, fiber, and protein 9) olive oil- reduces glucose levels, LDL, and triglycerides 10) salmon- excellent amount of omega-3-fatty acids 11) walnuts- rich in antioxidants 12) apples- high in fiber and quercetin 13) carrots- highly nutritious with low impact on blood sugar 14) eggs- improve HDL (good cholesterol), high in protein, keep you satiated 15) turmeric: improves blood sugars, cardiovascular disease, and protects kidney health 16) garlic: improves blood sugar, blood pressure, pain 17) tomatoes: highly nutritious with low impact on blood sugar   5) Cognitive impairment: -prescribed SLO -discussed her current cognitive difficulties. -discussed Cefazolin NAC -Recommended  starting NAC (N-Acetyl cysteine) 600mg  BID for her fatigue. Discussed its benefits in boosting glutathione and mitochondrial function.  -discussed Ritalin and Modafinil.    6) Impaired attention: -discussed ritalin, Modafinil, Concerta, Vyvanse , psychiatry referral -discussed her concerns with ritalin -discussed avoiding proceed and inflammatory foods -discussed that when she is interrupted it work it is very hard for her to get back on trach -prescribed Vyvanse   7) Tingling: -discussed that it is no longer painful -discussed that she gets scared that her arm goes asleep

## 2024-07-26 ENCOUNTER — Encounter: Payer: Self-pay | Admitting: Physical Medicine and Rehabilitation

## 2024-07-26 ENCOUNTER — Encounter: Attending: Physical Medicine and Rehabilitation | Admitting: Physical Medicine and Rehabilitation

## 2024-07-26 VITALS — BP 125/80 | HR 85 | Ht 70.0 in | Wt 221.2 lb

## 2024-07-26 DIAGNOSIS — G43809 Other migraine, not intractable, without status migrainosus: Secondary | ICD-10-CM | POA: Insufficient documentation

## 2024-07-26 DIAGNOSIS — E669 Obesity, unspecified: Secondary | ICD-10-CM | POA: Diagnosis present

## 2024-07-26 DIAGNOSIS — R4189 Other symptoms and signs involving cognitive functions and awareness: Secondary | ICD-10-CM | POA: Insufficient documentation

## 2024-07-26 DIAGNOSIS — R41840 Attention and concentration deficit: Secondary | ICD-10-CM | POA: Insufficient documentation

## 2024-07-26 MED ORDER — LISDEXAMFETAMINE DIMESYLATE 30 MG PO CAPS: 30.0000 mg | ORAL_CAPSULE | Freq: Every day | ORAL | 0 refills | Status: DC

## 2024-07-26 NOTE — Progress Notes (Signed)
 Subjective:    Patient ID: Danielle Kelley, female    DOB: 03-20-1981, 43 y.o.   MRN: [id number]  HPI Danielle Kelley is a 43 year old woman who presents for hospital follow-up after CIR admission for CVA.  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 Screens worsen her headaches.  -she is exhausted after bringing her kids home. -she is experiencing a lack of patience.  -she removes herself from difficult conversations -at home she would rather be in the room by herself which is not like her.  -this has not impacted her kids much but she distance herself from family members she couldn't tolerate.   -she has more clear boundaries.   2) Morbid obesity -weight is 302 lbs today -she does intermittent fasting -she eats between 12 and 6 -she has started to walk outside with her kids  3) Headaches: -topamax  is helping- she uses this every other day and does not use it when she does not have migraines she does not use it.  -more intense but less frequent -wearing hats help as she has light sensitivity  4) cognitive impairment -persists -never heard from SLP -she has to give presentations at work, it is hard for her to this- she has to go to trainings twice.  -she loses her train of thought very easily -pre-stroke subjects she has to think about harder but is able to compensat.  -thi is still an issue for her  5) impaired attention: -getting worse -homeopathic treatments have not helped -she was just accepted in [facility] program   Pain Inventory Average Pain 4 Pain Right Now 0 My pain is tingling, aching, dull  In the last 24 hours, has pain interfered with the following? General activity 2 Relation with others 0 Enjoyment of life 0 What TIME  of day is your pain at its worst? Daytime and evening Sleep (in general) Fair  Pain is worse with: some activites Pain improves with: Excedrin Relief from Meds: Fair  Family History  Problem Relation Age of Onset   Stroke Mother    Hypertension Mother    Hypertension Maternal Grandmother    Hypertension Maternal Grandfather    Sleep apnea Neg Hx    Social History   Socioeconomic History   Marital status: Single    Spouse name: Not on file   Number of children: Not on file   Years of education: Not on file   Highest education level: Not on file  Occupational History   Not on file  Tobacco Use   Smoking status: Never   Smokeless tobacco: Never  Vaping Use   Vaping status: Never Used  Substance and Sexual Activity   Alcohol use: Not on file   Drug use: Not on file   Sexual activity: Not on file  Other Topics Concern   Not on file  Social History Narrative   Not on file   Social Drivers of Health   Financial Resource Strain: Not on file  Food Insecurity: Low Risk  (11/16/2023)   Received from [facility]   Hunger Vital Sign    Within the past 12 months, you worried that your food would run out before you got money to buy more: Never true  Within the past 12 months, the food you bought just didn't last and you didn't have money to get more. : Never true  Transportation Needs: No Transportation Needs (11/16/2023)   Received from [facility]    In the past 12 months, has lack of reliable transportation kept you from medical appointments, meetings, work or from getting things needed for daily living? : No  Physical Activity: Not on file  Stress: Not on file  Social Connections: Not on file   Past Surgical History:  Procedure Laterality Date   ABDOMINAL SURGERY  04/06/2017   Gastric sleeve   BUBBLE STUDY N/A 06/26/2021   Procedure: BUBBLE STUDY;  Surgeon: Lonni Slain, MD;  Location: [facility] ENDOSCOPY;  Service: Cardiovascular;  Laterality:  N/A;   CESAREAN SECTION     2004, 2006, 2007.   TEE WITHOUT CARDIOVERSION N/A 06/26/2021   Procedure: TRANSESOPHAGEAL ECHOCARDIOGRAM (TEE);  Surgeon: Lonni Slain, MD;  Location: [facility] ENDOSCOPY;  Service: Cardiovascular;  Laterality: N/A;   Past Medical History:  Diagnosis Date   Obesity    Seasonal allergies    Stroke (HCC)    BP 125/80 (BP Location: Left Arm, Patient Position: Sitting, Cuff Size: Large)   Pulse 85   Ht 5' 10 (1.778 m)   Wt 221 lb 3.2 oz (100.3 kg)   SpO2 98%   BMI 31.74 kg/m   Opioid Risk Score:   Fall Risk Score:  `1  Depression screen PHQ 2/9     07/26/2024    2:23 PM 03/15/2024   10:30 AM 07/31/2021    2:21 PM  Depression screen PHQ 2/9  Decreased Interest 0 0 0  Down, Depressed, Hopeless 0 0 0  PHQ - 2 Score 0 0 0  Altered sleeping   1  Tired, decreased energy   2  Change in appetite   1  Feeling bad or failure about yourself    0  Trouble concentrating   2  Moving slowly or fidgety/restless   2  Suicidal thoughts   0  PHQ-9 Score   8  Difficult doing work/chores   Somewhat difficult     Review of Systems  Constitutional: Negative.   HENT: Negative.    Eyes: Negative.   Respiratory: Negative.    Endocrine: Negative.   Genitourinary: Negative.   Allergic/Immunologic: Negative.   Neurological:  Positive for headaches.       Migraines History of CVA  All other systems reviewed and are negative.      Objective:   Physical Exam Gen: no distress, normal appearing HEENT: oral mucosa pink and moist, NCAT Cardio: Reg rate Chest: normal effort, normal rate of breathing Abd: soft, non-distended Ext: no edema Psych: pleasant, normal affect Skin: intact Neuro: Word finding difficulties, impaired attention, impaired memory- continues    Assessment & Plan:  Danielle Kelley is a 43 year old woman who presents for hospital follow-up after CIR admission for CVA.  1) CVA -MRI Brain reviewed and shows a small acute infarct in the  periventricular right posterior frontal corona radiata, immediately superior to and potentially involving the right thalamus. Faint edema without mass effect. MRA shows no large vessel occlusion or proximal hemodynamically significant stenosis. -continue therapies -continue using cane in stores to minimize fall risk.  -provided dietary and exercise counseling -discussed that hypertension is number one reversible risk factor for stroke.  -recommended NAC or Cefarolin NAC for fatigue and cognitive impairments and she plans to use latter.  -provided counseling regarding return  to work, currently planning to return October 24th- at this time we discussed starting with half days to help minimize her screen time.  -Has returned to work.  -vitamin D 1,000U daily recommended, discussed that this can help with stroke prevention -discussed that she feels the stroke is one of the best things that ever happened to her because it made her live rather than just existing  2) Obesity: -Discussed that she is down fro 282 to 221 lbs! -discussed that she has improved her diet -Educated regarding health benefits of weight loss- for pain, general health, chronic disease prevention, immune health, mental health.  -Will monitor weight every visit.  -Consider [place] tea daily.  -Discussed the benefits of intermittent fasting. -Discussed foods that can assist in weight loss: 1) leafy greens- high in fiber and nutrients 2) dark chocolate- improves metabolism (if prefer sweetened, best to sweeten with honey instead of sugar).  3) cruciferous vegetables- high in fiber and protein 4) full fat yogurt: high in healthy fat, protein, calcium , and probiotics 5) apples- high in a variety of phytochemicals 6) nuts- high in fiber and protein that increase feelings of fullness 7) grapefruit: rich in nutrients, antioxidants, and fiber (not to be taken with anticoagulation) 8) beans- high in protein and fiber 9) salmon- has  high quality protein and healthy fats 10) green tea- rich in polyphenols 11) eggs- rich in choline and vitamin D 12) tuna- high protein, boosts metabolism 13) avocado- decreases visceral abdominal fat 14) chicken (pasture raised): high in protein and iron 15) blueberries- reduce abdominal fat and cholesterol 16) whole grains- decreases calories retained during digestion, speeds metabolism 17) chia seeds- curb appetite 18) chilies- increases fat metabolism  -Discussed supplements that can be used:  1) Metatrim 400mg  BID 30 minutes before breakfast and dinner  2) Sphaeranthus indicus and Garcinia mangostana (combinations of these and #1 can be found in capsicum and zychrome  3) green coffee bean extract 400mg  twice per day or Irvingia (african mango) 150 to 300mg  twice per day.   3) Headaches -continue Topamax , provided with refill -discussed elimination diet -provided note to say that she is sensitive to fluorescent lights -advised avoiding gluten -discussed that she would like to avoid taking medications -discussed that staying hydrated helps  4) Prediabetes:: -check CBGs daily, log, and bring log to follow-up appointment -avoid sugar, bread, pasta, rice -avoid snacking -perform daily foot exam and at least annual eye exam -try to incorporate into your diet some of the following foods which are good for diabetes: 1) cinnamon- imitates effects of insulin, increasing glucose transport into cells ([place] or [place] cinnamon is best, least processed) 2) nuts- can slow down the blood sugar response of carbohydrate rich foods 3) oatmeal- contains and anti-inflammatory compound avenanthramide 4) whole-milk yogurt (best types are no sugar, [place] yogurt, or goat/sheep yogurt) 5) beans- high in protein, fiber, and vitamins, low glycemic index 6) broccoli- great source of vitamin A and C 7) quinoa- higher in protein and fiber than other grains 8) spinach- high in vitamin A, fiber, and  protein 9) olive oil- reduces glucose levels, LDL, and triglycerides 10) salmon- excellent amount of omega-3-fatty acids 11) walnuts- rich in antioxidants 12) apples- high in fiber and quercetin 13) carrots- highly nutritious with low impact on blood sugar 14) eggs- improve HDL (good cholesterol), high in protein, keep you satiated 15) turmeric: improves blood sugars, cardiovascular disease, and protects kidney health 16) garlic: improves blood sugar, blood pressure, pain 17) tomatoes: highly nutritious with low  impact on blood sugar   5) Cognitive impairment: -prescribed SLO -discussed her current cognitive difficulties. -discussed Cefazolin NAC -recommended starting NAC (N-Acetyl cysteine) 600mg  BID for her fatigue. Discussed its benefits in boosting glutathione and mitochondrial function.  -discussed Ritalin and Modafinil.   -continue ashwaghanda  6) Impaired attention: -discussed benefits from [employer] -discussed that coffee can be a stimulant -discussed ritalin, Modafinil, Concerta, Vyvanse , psychiatry referral, discussed that she is using as needed to focus for long exams, discussed that the Vyvanse  does not last for long exams, discussed that I do not see any long-acting  -discussed her concerns with ritalin -discussed avoiding proceed and inflammatory foods -discussed that when she is interrupted it work it is very hard for her to get back on trach -prescribed Vyvanse  -discussed that her exams are 12 hours -discussed that her topamax  could be contributing so its good that she us  only using prn -discussed that she would benefit from more time to complete exams and assignments  7) Tingling: -discussed that it is no longer painful -discussed that she gets scared that her arm goes asleep

## 2024-07-26 NOTE — Patient Instructions (Signed)
 [employer]

## 2024-08-01 ENCOUNTER — Telehealth: Payer: Self-pay

## 2024-08-01 NOTE — Telephone Encounter (Signed)
 Approved today by [employer] 2017 CaseId:[id number];Status:Approved;Review Type:Prior Auth;Coverage Start Date:07/02/2024;Coverage End Date:08/01/2025; Effective Date: 07/02/2024 Authorization Expiration Date: 08/01/2025

## 2024-08-01 NOTE — Telephone Encounter (Signed)
 PA for Vyvanse  created in Cover my meds.

## 2024-10-03 ENCOUNTER — Telehealth: Payer: Self-pay | Admitting: *Deleted

## 2024-10-03 NOTE — Telephone Encounter (Signed)
 Danielle Kelley is calling for a refill on her vyvanse .

## 2024-10-04 ENCOUNTER — Other Ambulatory Visit: Payer: Self-pay | Admitting: Physical Medicine and Rehabilitation

## 2024-10-04 MED ORDER — LISDEXAMFETAMINE DIMESYLATE 30 MG PO CAPS: 30.0000 mg | ORAL_CAPSULE | Freq: Every day | ORAL | 0 refills | Status: AC

## 2025-01-23 ENCOUNTER — Ambulatory Visit: Admitting: Physical Medicine and Rehabilitation
